# Patient Record
Sex: Female | Born: 1955
Health system: Southern US, Community
[De-identification: ages and names within clinical notes are randomized; demographics above are authoritative.]

## PROBLEM LIST (undated history)

## (undated) DIAGNOSIS — E119 Type 2 diabetes mellitus without complications: Secondary | ICD-10-CM

## (undated) DIAGNOSIS — I10 Essential (primary) hypertension: Secondary | ICD-10-CM

## (undated) DIAGNOSIS — M1990 Unspecified osteoarthritis, unspecified site: Secondary | ICD-10-CM

## (undated) DIAGNOSIS — Z8669 Personal history of other diseases of the nervous system and sense organs: Secondary | ICD-10-CM

## (undated) DIAGNOSIS — K589 Irritable bowel syndrome without diarrhea: Secondary | ICD-10-CM

## (undated) DIAGNOSIS — N809 Endometriosis, unspecified: Secondary | ICD-10-CM

## (undated) HISTORY — DX: Type 2 diabetes mellitus without complications: E11.9

## (undated) HISTORY — DX: Essential (primary) hypertension: I10

## (undated) HISTORY — PX: BACK SURGERY: SHX140

## (undated) HISTORY — DX: Personal history of other diseases of the nervous system and sense organs: Z86.69

## (undated) HISTORY — PX: BREAST BIOPSY: SHX20

## (undated) HISTORY — DX: Irritable bowel syndrome, unspecified: K58.9

## (undated) HISTORY — DX: Unspecified osteoarthritis, unspecified site: M19.90

## (undated) HISTORY — DX: Endometriosis, unspecified: N80.9

## (undated) HISTORY — PX: TONSILLECTOMY AND ADENOIDECTOMY: SUR1326

---

## 1998-10-06 ENCOUNTER — Other Ambulatory Visit: Admission: RE | Admit: 1998-10-06 | Discharge: 1998-10-06 | Payer: Self-pay | Admitting: Obstetrics and Gynecology

## 2001-01-09 ENCOUNTER — Other Ambulatory Visit: Admission: RE | Admit: 2001-01-09 | Discharge: 2001-01-09 | Payer: Self-pay | Admitting: Obstetrics and Gynecology

## 2002-07-03 ENCOUNTER — Other Ambulatory Visit: Admission: RE | Admit: 2002-07-03 | Discharge: 2002-07-03 | Payer: Self-pay | Admitting: Obstetrics and Gynecology

## 2004-02-02 ENCOUNTER — Other Ambulatory Visit: Admission: RE | Admit: 2004-02-02 | Discharge: 2004-02-02 | Payer: Self-pay | Admitting: Obstetrics and Gynecology

## 2004-06-28 ENCOUNTER — Ambulatory Visit (HOSPITAL_COMMUNITY): Admission: RE | Admit: 2004-06-28 | Discharge: 2004-06-29 | Payer: Self-pay | Admitting: Neurosurgery

## 2005-03-08 ENCOUNTER — Other Ambulatory Visit: Admission: RE | Admit: 2005-03-08 | Discharge: 2005-03-08 | Payer: Self-pay | Admitting: Obstetrics and Gynecology

## 2005-03-30 ENCOUNTER — Encounter (INDEPENDENT_AMBULATORY_CARE_PROVIDER_SITE_OTHER): Payer: Self-pay | Admitting: *Deleted

## 2005-03-30 ENCOUNTER — Encounter: Admission: RE | Admit: 2005-03-30 | Discharge: 2005-03-30 | Payer: Self-pay | Admitting: Obstetrics and Gynecology

## 2005-09-11 ENCOUNTER — Encounter: Admission: RE | Admit: 2005-09-11 | Discharge: 2005-09-11 | Payer: Self-pay | Admitting: Obstetrics and Gynecology

## 2005-09-21 ENCOUNTER — Ambulatory Visit: Payer: Self-pay | Admitting: Internal Medicine

## 2006-03-27 ENCOUNTER — Encounter: Admission: RE | Admit: 2006-03-27 | Discharge: 2006-03-27 | Payer: Self-pay | Admitting: Obstetrics and Gynecology

## 2007-04-02 ENCOUNTER — Encounter: Admission: RE | Admit: 2007-04-02 | Discharge: 2007-04-02 | Payer: Self-pay | Admitting: Obstetrics and Gynecology

## 2008-04-02 ENCOUNTER — Encounter: Admission: RE | Admit: 2008-04-02 | Discharge: 2008-04-02 | Payer: Self-pay | Admitting: Obstetrics and Gynecology

## 2009-04-02 ENCOUNTER — Encounter: Admission: RE | Admit: 2009-04-02 | Discharge: 2009-04-02 | Payer: Self-pay | Admitting: Obstetrics and Gynecology

## 2010-03-03 ENCOUNTER — Encounter: Admission: RE | Admit: 2010-03-03 | Discharge: 2010-03-03 | Payer: Self-pay | Admitting: Obstetrics and Gynecology

## 2011-01-13 NOTE — Op Note (Signed)
[name], [name]                ACCOUNT NO.:  [id number]   MEDICAL RECORD NO.:  [id number]          PATIENT TYPE:  OIB   LOCATION:  2899                         FACILITY:  [facility]   PHYSICIAN:  Hilda Lias, M.D.   DATE OF BIRTH:  [birth year]   DATE OF PROCEDURE:  06/28/2004  DATE OF DISCHARGE:                                 OPERATIVE REPORT   PREOPERATIVE DIAGNOSES:  1.  Right L5-S1 herniated disk.  2.  Obesity.   POSTOPERATIVE DIAGNOSES:  1.  Right L5-S1 herniated disk.  2.  Obesity.   PROCEDURE:  Right L5-S1 diskectomy and foraminotomy, using the microscope.   SURGEON:  Hilda Lias, M.D.   INDICATIONS FOR PROCEDURE:  The patient was admitted because of back and  right leg pain.  An MRI showed that she has degenerative disk disease at  multiple levels, with a herniated disk compromising the S1 nerve root.  The  patient would like to proceed with surgery, and the risks were explained in  the history and physical.   DESCRIPTION OF PROCEDURE:  The patient was taken to the operating room.  The  intubation took at least one-half an hour because of difficulty with her  small larynx.  Nevertheless at the end, she was positioned in a prone  manner.  The back was prepped with Betadine.  It was difficult to palpate  any bone landmarks because of her obesity.  We made a midline incision right  below the iliac crest in the midline, and we went straight down through the  skin, subcutaneous, adipose tissue, down to the fascia.  The fascia was  reflected as well as the muscle.  Indeed we knew that we are at the level of  L5-S1.  Nevertheless an x-ray was taken, which showed that indeed we were at  the L5-S1 level.  Then because of the poor visualization, we brought the  microscope immediately.  With the drill we drilled at the level of the  lamina of L5 and the upper pole of S1.  A calcified yellow ligament was also  excised.  We found the S1 nerve root which was a little displaced  posteriorly and medially, and it was swollen and reddish.  Lysis was  achieved, sheath retraction was made.  Indeed there was a large herniated  disk compromising the takeoff of S1.  An incision was made and two large  pieces of fragment were removed.  From then on we entered the disk space  which was quite narrow and a total broad diskectomy was achieved.  This was  done medially and laterally.  We investigated the foramen and it was empty.  A foraminotomy was accomplished.  At the end the patient had good  mobilization of the S1 nerve root, as well as the L5.  Valsalva maneuver was  negative.  The area was irrigated.  Fentanyl and Depo-Medrol were left in  the apical space.  From then on we closed the fascia with Vicryl and the  skin with subcutaneous #3-0.  The patient did well.     EB/MEDQ  D:  06/28/2004  T:  06/28/2004  Job:  [id number]

## 2011-01-30 ENCOUNTER — Other Ambulatory Visit: Payer: Self-pay | Admitting: Obstetrics and Gynecology

## 2011-01-30 DIAGNOSIS — Z1231 Encounter for screening mammogram for malignant neoplasm of breast: Secondary | ICD-10-CM

## 2011-03-20 ENCOUNTER — Ambulatory Visit
Admission: RE | Admit: 2011-03-20 | Discharge: 2011-03-20 | Disposition: A | Discharge status: Home / Self Care | Payer: BC Managed Care – PPO | Source: Ambulatory Visit | Attending: Obstetrics and Gynecology | Admitting: Obstetrics and Gynecology

## 2011-03-20 DIAGNOSIS — Z1231 Encounter for screening mammogram for malignant neoplasm of breast: Secondary | ICD-10-CM

## 2011-03-28 ENCOUNTER — Other Ambulatory Visit: Payer: Self-pay | Admitting: Obstetrics and Gynecology

## 2012-02-13 ENCOUNTER — Other Ambulatory Visit: Payer: Self-pay | Admitting: Obstetrics and Gynecology

## 2012-02-13 DIAGNOSIS — Z1231 Encounter for screening mammogram for malignant neoplasm of breast: Secondary | ICD-10-CM

## 2012-03-20 ENCOUNTER — Ambulatory Visit
Admission: RE | Admit: 2012-03-20 | Discharge: 2012-03-20 | Disposition: A | Discharge status: Home / Self Care | Payer: BC Managed Care – PPO | Source: Ambulatory Visit | Attending: Obstetrics and Gynecology | Admitting: Obstetrics and Gynecology

## 2012-03-20 ENCOUNTER — Other Ambulatory Visit: Payer: Self-pay | Admitting: Obstetrics and Gynecology

## 2012-03-20 DIAGNOSIS — N644 Mastodynia: Secondary | ICD-10-CM

## 2012-03-20 DIAGNOSIS — Z1231 Encounter for screening mammogram for malignant neoplasm of breast: Secondary | ICD-10-CM

## 2012-04-04 ENCOUNTER — Ambulatory Visit
Admission: RE | Admit: 2012-04-04 | Discharge: 2012-04-04 | Disposition: A | Discharge status: Home / Self Care | Payer: BC Managed Care – PPO | Source: Ambulatory Visit | Attending: Obstetrics and Gynecology | Admitting: Obstetrics and Gynecology

## 2012-04-04 ENCOUNTER — Other Ambulatory Visit: Payer: Self-pay | Admitting: Obstetrics and Gynecology

## 2012-04-04 DIAGNOSIS — N644 Mastodynia: Secondary | ICD-10-CM

## 2013-02-17 ENCOUNTER — Other Ambulatory Visit: Payer: Self-pay

## 2013-02-17 DIAGNOSIS — Z1231 Encounter for screening mammogram for malignant neoplasm of breast: Secondary | ICD-10-CM

## 2013-04-04 ENCOUNTER — Ambulatory Visit
Admission: RE | Admit: 2013-04-04 | Discharge: 2013-04-04 | Disposition: A | Discharge status: Home / Self Care | Payer: BC Managed Care – PPO | Source: Ambulatory Visit

## 2013-04-04 DIAGNOSIS — Z1231 Encounter for screening mammogram for malignant neoplasm of breast: Secondary | ICD-10-CM

## 2013-06-13 ENCOUNTER — Encounter: Payer: Self-pay | Admitting: Vascular Surgery

## 2013-06-24 ENCOUNTER — Encounter: Payer: Self-pay | Admitting: Vascular Surgery

## 2013-06-25 ENCOUNTER — Ambulatory Visit (INDEPENDENT_AMBULATORY_CARE_PROVIDER_SITE_OTHER): Payer: BC Managed Care – PPO | Admitting: Vascular Surgery

## 2013-06-25 ENCOUNTER — Encounter: Payer: Self-pay | Admitting: Vascular Surgery

## 2013-06-25 VITALS — BP 148/82 | HR 78 | Resp 16 | Ht 65.0 in | Wt 234.0 lb

## 2013-06-25 DIAGNOSIS — I728 Aneurysm of other specified arteries: Secondary | ICD-10-CM | POA: Insufficient documentation

## 2013-06-25 DIAGNOSIS — R319 Hematuria, unspecified: Secondary | ICD-10-CM

## 2013-06-25 DIAGNOSIS — M5137 Other intervertebral disc degeneration, lumbosacral region: Secondary | ICD-10-CM

## 2013-06-25 DIAGNOSIS — Z01810 Encounter for preprocedural cardiovascular examination: Secondary | ICD-10-CM

## 2013-06-25 DIAGNOSIS — M51369 Other intervertebral disc degeneration, lumbar region without mention of lumbar back pain or lower extremity pain: Secondary | ICD-10-CM

## 2013-06-25 DIAGNOSIS — M5136 Other intervertebral disc degeneration, lumbar region: Secondary | ICD-10-CM

## 2013-06-25 NOTE — Progress Notes (Signed)
[facility] [place]  Referred by:  Bjorn Pippin, MD [address] [place], [place] [address]  Reason for referral: splenic artery aneurysm  History of Present Illness  Raven Green is a 57 y.o. (Sep 20, 1955) female who presents with chief complaint: splenic artery aneurysm.  This patient had an episode of bilateral flank pain and hemauria a few months ago.  Initially, it was felt her sx were due to a UTI, but a month later she still had sx and hematuria.  She was referred to Urology for evaluation.  A non-contrast CT Abd/pelvis was ordered and incidentally a calcified splenic artery aneurysm was found.  The patient currently has no flank pain or hematuria.  She denies any LUQ pain.  The patient has had two kids already and now is menopausal.  She has no familial aneurysmal history.  Past Medical History  Diagnosis Date  . Arthritis   . Endometriosis   . Diabetes mellitus without complication     gestational  . Hypertension   . IBS (irritable bowel syndrome)   . History of migraine headaches     Past Surgical History  Procedure Laterality Date  . Back surgery    . Tonsillectomy and adenoidectomy      History   Social History  . Marital Status: Married    Spouse Name: N/A    Number of Children: N/A  . Years of Education: N/A   Occupational History  . Not on file.   Social History Main Topics  . Smoking status: Never Smoker   . Smokeless tobacco: Never Used  . Alcohol Use: Yes  . Drug Use: No  . Sexual Activity: Not on file   Other Topics Concern  . Not on file   Social History Narrative  . No narrative on file    Family History  Problem Relation Age of Onset  . Cancer Maternal Grandmother     breast  . Cancer Maternal Grandfather     bladder  . Deep vein thrombosis Mother   . Hyperlipidemia Mother   . Hypertension Mother   . Varicose Veins Mother   . Other Mother     lupus anticoagulation  . Diabetes Father   .  Hypertension Father   . Peripheral vascular disease Father   . Cancer Father     bladder  . Diabetes Sister    Current Outpatient Prescriptions on File Prior to Visit  Medication Sig Dispense Refill  . chlorthalidone (HYGROTON) 25 MG tablet Take 25 mg by mouth daily.      . metoprolol succinate (TOPROL-XL) 25 MG 24 hr tablet Take 25 mg by mouth daily. Take 1/2 tablet daily       No current facility-administered medications on file prior to visit.    Allergies  Allergen Reactions  . Codeine   . Sulfa Antibiotics      REVIEW OF SYSTEMS:  (Positives checked otherwise negative)  CARDIOVASCULAR:  []  chest pain, []  chest pressure, []  palpitations, []  shortness of breath when laying flat, []  shortness of breath with exertion,  []  pain in feet when walking, []  pain in feet when laying flat, []  history of blood clot in veins (DVT), []  history of phlebitis, []  swelling in legs, [x]  varicose veins  PULMONARY:  []  productive cough, []  asthma, []  wheezing  NEUROLOGIC:  []  weakness in arms or legs, []  numbness in arms or legs, []  difficulty speaking or slurred speech, []  temporary loss of vision in one  eye, []  dizziness  HEMATOLOGIC:  []  bleeding problems, []  problems with blood clotting too easily  MUSCULOSKEL:  []  joint pain, []  joint swelling  GASTROINTEST:  []  vomiting blood, []  blood in stool     GENITOURINARY:  []  burning with urination, []  blood in urine  PSYCHIATRIC:  []  history of major depression  INTEGUMENTARY:  []  rashes, []  ulcers  CONSTITUTIONAL:  []  fever, []  chills   Physical Examination  Filed Vitals:   06/25/13 1523  BP: 148/82  Pulse: 78  Resp: 16  Height: 5\' 5"  (1.651 m)  Weight: 234 lb (106.142 kg)    Body mass index is 38.94 kg/(m^2).  General: A&O x 3, WD, obese  Head: [place]/AT  Ear/Nose/Throat: Hearing grossly intact, nares w/o erythema or drainage, oropharynx w/o Erythema/Exudate, Mallampati score: 3  Eyes: PERRLA, EOMI  Neck: Supple, no nuchal  rigidity, no palpable LAD  Pulmonary: Sym exp, good air movt, CTAB, no rales, rhonchi, & wheezing  Cardiac: RRR, Nl S1, S2, no Murmurs, rubs or gallops  Vascular: Vessel Right Left  Radial Palpable Palpable  Brachial Palpable Palpable  Carotid Palpable, without bruit Palpable, without bruit  Aorta Not palpable N/A  Femoral Palpable Palpable  Popliteal Not palpable Not palpable  PT Palpable Palpable  DP Palpable Palpable   Gastrointestinal: soft, NTND, -G/R, - HSM, - masses, - CVAT B, no LUQ or L flank pain  Musculoskeletal: M/S 5/5 throughout , Extremities without ischemic changes   Neurologic: CN 2-12 intact , Pain and light touch intact in extremities , Motor exam as listed above  Psychiatric: Judgment intact, Mood & affect appropriate for pt's clinical situation  Dermatologic: See M/S exam for extremity exam, no rashes otherwise noted  Lymph : No Cervical, Axillary, or Inguinal lymphadenopathy   CT Abd/pelvis without contrast (05/16/13)   Probable 28 mm calcified splenic artery aneurysm  Based on my review of the patient's CT, he has a calcified mass in LUQ.  It's proximity to the tail of the pancreas and spleen makes it likely a splenic artery aneurysm.  Further evaluation with a CTA is necessary.  Outside Studies/Documentation 10 pages of outside documents were reviewed including: outside CT report, urology reports.  Medical Decision Making  Raven Green is a 57 y.o. female who presents with: possible splenic artery aneurysm.   I would confirm the SAA with a CTA abd/pelvis with 3D reconstruction of the splenic artery to try to get measurements.  The calcified mass appears to be > 2 cm, so if a SAA is confirmed, treatment is indicated.  Depending on the CTA findings, treatment may involve embolization, cover stent placement, or splenectomy.  Due to the possibility of future interventions that may cause loss of the spleen, I recommend going ahead and giving  vaccinations for encapsulated organisms: pneumococcus, H. Influenza, and meningococcus.  The patient will follow up in 2-3 weeks after the CTA is completed and the vaccines have been given.  Thank you for allowing [place] to participate in this patient's care.  Leonides Sake, MD [facility] [place] Office: [phone] Pager: [phone]  06/25/2013, 4:16 PM

## 2013-07-01 ENCOUNTER — Encounter: Payer: BC Managed Care – PPO | Admitting: Vascular Surgery

## 2013-07-10 ENCOUNTER — Encounter: Payer: Self-pay | Admitting: Vascular Surgery

## 2013-07-11 ENCOUNTER — Ambulatory Visit
Admission: RE | Admit: 2013-07-11 | Discharge: 2013-07-11 | Disposition: A | Discharge status: Home / Self Care | Payer: BC Managed Care – PPO | Source: Ambulatory Visit | Attending: Vascular Surgery | Admitting: Vascular Surgery

## 2013-07-11 ENCOUNTER — Other Ambulatory Visit: Payer: BC Managed Care – PPO

## 2013-07-11 ENCOUNTER — Encounter: Payer: Self-pay | Admitting: Vascular Surgery

## 2013-07-11 ENCOUNTER — Ambulatory Visit (INDEPENDENT_AMBULATORY_CARE_PROVIDER_SITE_OTHER): Payer: BC Managed Care – PPO | Admitting: Vascular Surgery

## 2013-07-11 ENCOUNTER — Inpatient Hospital Stay: Admission: RE | Admit: 2013-07-11 | Payer: BC Managed Care – PPO | Source: Ambulatory Visit

## 2013-07-11 VITALS — BP 134/59 | HR 66 | Ht 65.0 in | Wt 228.2 lb

## 2013-07-11 DIAGNOSIS — I728 Aneurysm of other specified arteries: Secondary | ICD-10-CM

## 2013-07-11 DIAGNOSIS — Z01810 Encounter for preprocedural cardiovascular examination: Secondary | ICD-10-CM

## 2013-07-11 MED ORDER — IOHEXOL 350 MG/ML SOLN
75.0000 mL | Freq: Once | INTRAVENOUS | Status: AC | PRN
  Administered 2013-07-11: 75 mL via INTRAVENOUS

## 2013-07-11 NOTE — Progress Notes (Signed)
[facility] [place]  Established Splenic Aneurysm  History of Present Illness  The patient is a 57 y.o. (11-Mar-1956) female who presents with chief complaint: follow up from CTA to evaluate an incidentally found SAA.  Pt is currentl asx and post-menopausal.  She had her meningococcal and pneumococcal vaccine given but did not get her H.influenza vaccination due to mistake with the flu vaccine.    Past Medical History  Diagnosis Date  . Arthritis   . Endometriosis   . Diabetes mellitus without complication     gestational  . Hypertension   . IBS (irritable bowel syndrome)   . History of migraine headaches     Past Surgical History  Procedure Laterality Date  . Back surgery    . Tonsillectomy and adenoidectomy      History   Social History  . Marital Status: Married    Spouse Name: N/A    Number of Children: N/A  . Years of Education: N/A   Occupational History  . Not on file.   Social History Main Topics  . Smoking status: Never Smoker   . Smokeless tobacco: Never Used  . Alcohol Use: Yes  . Drug Use: No  . Sexual Activity: Not on file   Other Topics Concern  . Not on file   Social History Narrative  . No narrative on file    Family History  Problem Relation Age of Onset  . Cancer Maternal Grandmother     breast  . Cancer Maternal Grandfather     bladder  . Deep vein thrombosis Mother   . Hyperlipidemia Mother   . Hypertension Mother   . Varicose Veins Mother   . Other Mother     lupus anticoagulation  . Diabetes Father   . Hypertension Father   . Peripheral vascular disease Father   . Cancer Father     bladder  . Diabetes Sister    Current Outpatient Prescriptions on File Prior to Visit  Medication Sig Dispense Refill  . chlorthalidone (HYGROTON) 25 MG tablet Take 25 mg by mouth daily.      . metoprolol succinate (TOPROL-XL) 25 MG 24 hr tablet Take 25 mg by mouth daily. Take 1/2 tablet daily       No current  facility-administered medications on file prior to visit.    Allergies  Allergen Reactions  . Codeine   . Sulfa Antibiotics    REVIEW OF SYSTEMS: (Positives checked otherwise negative)  CARDIOVASCULAR: []  chest pain, []  chest pressure, []  palpitations, []  shortness of breath when laying flat, []  shortness of breath with exertion, []  pain in feet when walking, []  pain in feet when laying flat, []  history of blood clot in veins (DVT), []  history of phlebitis, []  swelling in legs, [x]  varicose veins  PULMONARY: []  productive cough, []  asthma, []  wheezing  NEUROLOGIC: []  weakness in arms or legs, []  numbness in arms or legs, []  difficulty speaking or slurred speech, []  temporary loss of vision in one eye, []  dizziness  HEMATOLOGIC: []  bleeding problems, []  problems with blood clotting too easily  MUSCULOSKEL: []  joint pain, []  joint swelling  GASTROINTEST: []  vomiting blood, []  blood in stool  GENITOURINARY: []  burning with urination, []  blood in urine  PSYCHIATRIC: []  history of major depression  INTEGUMENTARY: []  rashes, []  ulcers  CONSTITUTIONAL: []  fever, []  chills  Physical Examination  Filed Vitals:   07/11/13 1029  BP: 134/59  Pulse: 66  Height: 5\' 5"  (1.651 m)  Weight:  228 lb 3.2 oz (103.511 kg)  SpO2: 97%   Body mass index is 37.97 kg/(m^2).  General: A&O x 3, WD, obese   Pulmonary: Sym exp, good air movt, CTAB, no rales, rhonchi, & wheezing   Cardiac: RRR, Nl S1, S2, no Murmurs, rubs or gallops   Vascular:  Vessel  Right  Left   Radial  Palpable  Palpable   Brachial  Palpable  Palpable   Carotid  Palpable, without bruit  Palpable, without bruit   Aorta  Not palpable  N/A   Femoral  Palpable  Palpable   Popliteal  Not palpable  Not palpable   PT  Palpable  Palpable   DP  Palpable  Palpable    Gastrointestinal: soft, NTND, -G/R, - HSM, - masses, - CVAT B, no LUQ or L flank pain   Musculoskeletal: M/S 5/5 throughout , Extremities without ischemic changes    Neurologic: Pain and light touch intact in extremities , Motor exam as listed above   CTA Abd/pelvis (07/11/2013)  Splenic artery aneurysm measuring 3 cm in greatest dimension.   In lies in the midportion of the splenic artery along the tail of the pancreas.   No complicating factors are noted at this time.   It shows evidence of a wide neck.  Left renal cyst.   No other focal abnormality is noted.  Based on my review of the CTA, her aneurysm appears compatible with possible covered stent placement.  The splenic artery appears to be 7 mm proximal and distal to the aneurysm.  There appears to be some normal splenic artery distal to the aneurysm.  Medical Decision Making  The patient is a 57 y.o. female who presents with: asx moderate size SAA  Based on the CTA, I recommend SAA exclusion with covered stent vs embolization.  The patient still needs to get her H.influenza vaccination.  The patient's schedule is more compatible with intervention next month, DEC 17-19.  I discussed with the patient the nature of angiographic procedures, especially the limited patencies of any endovascular intervention.    The patient is aware of that the risks of an angiographic procedure include but are not limited to: bleeding, infection, access site complications, renal failure, embolization, rupture of vessel, dissection, possible need for emergent surgical intervention, possible need for surgical procedures to treat the patient's pathology, anaphylactic reaction to contrast, and stroke and death.    The patient is aware of the risks and agrees to proceed.  Thank you for allowing [place] to participate in this patient's care.  Leonides Sake, MD [facility] Office: [phone] Pager: [phone]  07/11/2013, 1:45 PM

## 2013-07-21 ENCOUNTER — Other Ambulatory Visit: Payer: Self-pay | Admitting: *Deleted

## 2013-07-21 ENCOUNTER — Encounter: Payer: Self-pay | Admitting: *Deleted

## 2013-07-21 DIAGNOSIS — Z01818 Encounter for other preprocedural examination: Secondary | ICD-10-CM

## 2013-08-12 MED ORDER — SODIUM CHLORIDE 0.9 % IV SOLN
INTRAVENOUS | Status: DC
  Administered 2013-08-13: 08:00:00 via INTRAVENOUS

## 2013-08-13 ENCOUNTER — Encounter (HOSPITAL_COMMUNITY): Payer: Self-pay | Admitting: *Deleted

## 2013-08-13 ENCOUNTER — Encounter (HOSPITAL_COMMUNITY)
Admission: RE | Disposition: A | Discharge status: Home / Self Care | Payer: Self-pay | Source: Ambulatory Visit | Attending: Vascular Surgery

## 2013-08-13 ENCOUNTER — Observation Stay (HOSPITAL_COMMUNITY)
Admission: RE | Admit: 2013-08-13 | Discharge: 2013-08-14 | Disposition: A | Discharge status: Home / Self Care | Payer: BC Managed Care – PPO | Source: Ambulatory Visit | Attending: Vascular Surgery | Admitting: Vascular Surgery

## 2013-08-13 DIAGNOSIS — M51379 Other intervertebral disc degeneration, lumbosacral region without mention of lumbar back pain or lower extremity pain: Secondary | ICD-10-CM | POA: Insufficient documentation

## 2013-08-13 DIAGNOSIS — M5137 Other intervertebral disc degeneration, lumbosacral region: Secondary | ICD-10-CM | POA: Insufficient documentation

## 2013-08-13 DIAGNOSIS — I728 Aneurysm of other specified arteries: Principal | ICD-10-CM | POA: Diagnosis present

## 2013-08-13 DIAGNOSIS — M129 Arthropathy, unspecified: Secondary | ICD-10-CM | POA: Insufficient documentation

## 2013-08-13 DIAGNOSIS — N809 Endometriosis, unspecified: Secondary | ICD-10-CM | POA: Insufficient documentation

## 2013-08-13 DIAGNOSIS — I10 Essential (primary) hypertension: Secondary | ICD-10-CM | POA: Insufficient documentation

## 2013-08-13 DIAGNOSIS — G43909 Migraine, unspecified, not intractable, without status migrainosus: Secondary | ICD-10-CM | POA: Insufficient documentation

## 2013-08-13 DIAGNOSIS — K589 Irritable bowel syndrome without diarrhea: Secondary | ICD-10-CM | POA: Insufficient documentation

## 2013-08-13 DIAGNOSIS — Z01818 Encounter for other preprocedural examination: Secondary | ICD-10-CM

## 2013-08-13 HISTORY — PX: EMBOLIZATION: SHX5507

## 2013-08-13 HISTORY — PX: VISCERAL ANGIOGRAM: SHX5515

## 2013-08-13 LAB — POCT I-STAT, CHEM 8
Creatinine, Ser: 0.7 mg/dL (ref 0.50–1.10)
Glucose, Bld: 194 mg/dL — ABNORMAL HIGH (ref 70–99)
HCT: 36 % (ref 36.0–46.0)
Hemoglobin: 12.2 g/dL (ref 12.0–15.0)
Potassium: 3.1 mEq/L — ABNORMAL LOW (ref 3.5–5.1)
TCO2: 28 mmol/L (ref 0–100)

## 2013-08-13 LAB — POCT ACTIVATED CLOTTING TIME: Activated Clotting Time: 132 seconds

## 2013-08-13 LAB — CBC
HCT: 35.6 % — ABNORMAL LOW (ref 36.0–46.0)
Hemoglobin: 11.7 g/dL — ABNORMAL LOW (ref 12.0–15.0)
MCH: 29.1 pg (ref 26.0–34.0)
MCHC: 32.9 g/dL (ref 30.0–36.0)
MCV: 88.6 fL (ref 78.0–100.0)
RBC: 4.02 MIL/uL (ref 3.87–5.11)
RDW: 13.6 % (ref 11.5–15.5)

## 2013-08-13 LAB — CREATININE, SERUM
Creatinine, Ser: 0.66 mg/dL (ref 0.50–1.10)
GFR calc Af Amer: >90 mL/min (ref >90)
GFR calc non Af Amer: >90 mL/min (ref >90)

## 2013-08-13 SURGERY — VISCERAL ANGIOGRAM
Anesthesia: LOCAL

## 2013-08-13 MED ORDER — METOPROLOL TARTRATE 1 MG/ML IV SOLN: 2.0000 mg | INTRAVENOUS | Status: DC | PRN

## 2013-08-13 MED ORDER — OXYCODONE-ACETAMINOPHEN 5-325 MG PO TABS
1.0000 | ORAL_TABLET | ORAL | Status: DC | PRN
  Administered 2013-08-13: 2 via ORAL
  Filled 2013-08-13: qty 2

## 2013-08-13 MED ORDER — MIDAZOLAM HCL 2 MG/2ML IJ SOLN
INTRAMUSCULAR | Status: AC
  Filled 2013-08-13: qty 2

## 2013-08-13 MED ORDER — METOPROLOL SUCCINATE ER 25 MG PO TB24
12.5000 mg | ORAL_TABLET | Freq: Every day | ORAL | Status: DC
  Administered 2013-08-14: 12.5 mg via ORAL
  Filled 2013-08-13: qty 0.5

## 2013-08-13 MED ORDER — OXYCODONE-ACETAMINOPHEN 5-325 MG PO TABS: 1.0000 | ORAL_TABLET | ORAL | Status: DC | PRN

## 2013-08-13 MED ORDER — SODIUM CHLORIDE 0.9 % IV SOLN
INTRAVENOUS | Status: DC
  Administered 2013-08-13: 13:00:00 via INTRAVENOUS

## 2013-08-13 MED ORDER — ONDANSETRON HCL 4 MG/2ML IJ SOLN
4.0000 mg | Freq: Four times a day (QID) | INTRAMUSCULAR | Status: DC | PRN
  Administered 2013-08-13: 4 mg via INTRAVENOUS
  Filled 2013-08-13: qty 2

## 2013-08-13 MED ORDER — HEPARIN SODIUM (PORCINE) 1000 UNIT/ML IJ SOLN
INTRAMUSCULAR | Status: AC
Start: 2013-08-13 — End: 2013-08-13
  Filled 2013-08-13: qty 1

## 2013-08-13 MED ORDER — GUAIFENESIN-DM 100-10 MG/5ML PO SYRP: 15.0000 mL | ORAL_SOLUTION | ORAL | Status: DC | PRN

## 2013-08-13 MED ORDER — SODIUM CHLORIDE 0.9 % IV SOLN: 1.0000 mL/kg/h | INTRAVENOUS | Status: DC

## 2013-08-13 MED ORDER — LIDOCAINE HCL (PF) 1 % IJ SOLN
INTRAMUSCULAR | Status: AC
  Filled 2013-08-13: qty 30

## 2013-08-13 MED ORDER — HYDRALAZINE HCL 20 MG/ML IJ SOLN: 10.0000 mg | INTRAMUSCULAR | Status: DC | PRN

## 2013-08-13 MED ORDER — ENOXAPARIN SODIUM 40 MG/0.4ML ~~LOC~~ SOLN
40.0000 mg | SUBCUTANEOUS | Status: DC
  Filled 2013-08-13: qty 0.4

## 2013-08-13 MED ORDER — LABETALOL HCL 5 MG/ML IV SOLN: 10.0000 mg | INTRAVENOUS | Status: DC | PRN

## 2013-08-13 MED ORDER — FENTANYL CITRATE 0.05 MG/ML IJ SOLN
INTRAMUSCULAR | Status: AC
  Filled 2013-08-13: qty 2

## 2013-08-13 MED ORDER — PHENOL 1.4 % MT LIQD
1.0000 | OROMUCOSAL | Status: DC | PRN
  Filled 2013-08-13: qty 177

## 2013-08-13 MED ORDER — PROMETHAZINE HCL 25 MG/ML IJ SOLN
12.5000 mg | Freq: Four times a day (QID) | INTRAMUSCULAR | Status: DC | PRN
  Administered 2013-08-13: 12.5 mg via INTRAVENOUS
  Filled 2013-08-13: qty 1

## 2013-08-13 MED ORDER — ONDANSETRON HCL 4 MG/2ML IJ SOLN: 4.0000 mg | Freq: Four times a day (QID) | INTRAMUSCULAR | Status: DC | PRN

## 2013-08-13 MED ORDER — CHLORTHALIDONE 25 MG PO TABS
25.0000 mg | ORAL_TABLET | Freq: Every day | ORAL | Status: DC
  Administered 2013-08-14: 25 mg via ORAL
  Filled 2013-08-13: qty 1

## 2013-08-13 MED ORDER — ACETAMINOPHEN 325 MG PO TABS: 650.0000 mg | ORAL_TABLET | ORAL | Status: DC | PRN

## 2013-08-13 MED ORDER — HEPARIN (PORCINE) IN NACL 2-0.9 UNIT/ML-% IJ SOLN
INTRAMUSCULAR | Status: AC
  Filled 2013-08-13: qty 1000

## 2013-08-13 MED ORDER — ALUM & MAG HYDROXIDE-SIMETH 200-200-20 MG/5ML PO SUSP: 15.0000 mL | ORAL | Status: DC | PRN

## 2013-08-13 MED ORDER — ENOXAPARIN SODIUM 40 MG/0.4ML ~~LOC~~ SOLN
40.0000 mg | SUBCUTANEOUS | Status: DC
  Administered 2013-08-14: 40 mg via SUBCUTANEOUS
  Filled 2013-08-13 (×2): qty 0.4

## 2013-08-13 MED ORDER — PANTOPRAZOLE SODIUM 40 MG PO TBEC
40.0000 mg | DELAYED_RELEASE_TABLET | Freq: Every day | ORAL | Status: DC
  Administered 2013-08-14: 40 mg via ORAL
  Filled 2013-08-13: qty 1

## 2013-08-13 MED ORDER — MORPHINE SULFATE 2 MG/ML IJ SOLN
2.0000 mg | INTRAMUSCULAR | Status: DC | PRN
  Administered 2013-08-13: 2 mg via INTRAVENOUS
  Filled 2013-08-13: qty 1

## 2013-08-13 MED ORDER — MORPHINE SULFATE 10 MG/ML IJ SOLN: 2.0000 mg | INTRAMUSCULAR | Status: DC | PRN

## 2013-08-13 MED ORDER — DOCUSATE SODIUM 100 MG PO CAPS
100.0000 mg | ORAL_CAPSULE | Freq: Two times a day (BID) | ORAL | Status: DC
  Administered 2013-08-13 – 2013-08-14 (×2): 100 mg via ORAL
  Filled 2013-08-13 (×3): qty 1

## 2013-08-13 NOTE — H&P (Signed)
  [facility] [place]  Brief History and Physical  History of Present Illness  Raven Green is a 57 y.o. female who presents with chief complaint: splenic artery aneurysm.  The patient presents today for celiac artery angiogram, possible splenic artery exclusion vs embolization.    Past Medical History  Diagnosis Date  . Arthritis   . Endometriosis   . Diabetes mellitus without complication     gestational  . Hypertension   . IBS (irritable bowel syndrome)   . History of migraine headaches     Past Surgical History  Procedure Laterality Date  . Back surgery    . Tonsillectomy and adenoidectomy      History   Social History  . Marital Status: Married    Spouse Name: N/A    Number of Children: N/A  . Years of Education: N/A   Occupational History  . Not on file.   Social History Main Topics  . Smoking status: Never Smoker   . Smokeless tobacco: Never Used  . Alcohol Use: Yes  . Drug Use: No  . Sexual Activity: Not on file   Other Topics Concern  . Not on file   Social History Narrative  . No narrative on file    Family History  Problem Relation Age of Onset  . Cancer Maternal Grandmother     breast  . Cancer Maternal Grandfather     bladder  . Deep vein thrombosis Mother   . Hyperlipidemia Mother   . Hypertension Mother   . Varicose Veins Mother   . Other Mother     lupus anticoagulation  . Diabetes Father   . Hypertension Father   . Peripheral vascular disease Father   . Cancer Father     bladder  . Diabetes Sister     No current facility-administered medications on file prior to encounter.   Current Outpatient Prescriptions on File Prior to Encounter  Medication Sig Dispense Refill  . chlorthalidone (HYGROTON) 25 MG tablet Take 25 mg by mouth daily.      . metoprolol succinate (TOPROL-XL) 25 MG 24 hr tablet Take 25 mg by mouth daily. Take 1/2 tablet daily        Allergies  Allergen Reactions  . Codeine   . Sulfa  Antibiotics     Review of Systems: As listed above, otherwise negative.  Physical Examination  Filed Vitals:   08/13/13 0738  BP: 169/64  Pulse: 77  Temp: 97.5 F (36.4 C)  TempSrc: Oral  Resp: 18  Height: 5\' 5"  (1.651 m)  Weight: 230 lb (104.327 kg)  SpO2: 97%    General: A&O x 3, WDWN  Pulmonary: Sym exp, good air movt, CTAB, no rales, rhonchi, & wheezing  Cardiac: RRR, Nl S1, S2, no Murmurs, rubs or gallops  Gastrointestinal: soft, NTND, -G/R, - HSM, - masses, - CVAT B  Musculoskeletal: M/S 5/5 throughout , Extremities without ischemic changes   Laboratory See iStat  Medical Decision Making  Raven Green is a 57 y.o. female who presents with: SPLENIC ARTERY ANEURYSM.  The patient is scheduled for: celiac artery angiogram, possible exclusion vs embolization of splenic artery aneurysm.  The patient is aware of the risks and agrees to proceed.  Leonides Sake, MD [facility] [place] Office: [phone] Pager: [phone]  08/13/2013, 7:52 AM

## 2013-08-13 NOTE — Op Note (Addendum)
OPERATIVE NOTE   PROCEDURE: 1.  Right common femoral artery cannulation under ultrasound guidance 2.  Aortogram 3.  Selection of celiac artery 4.  Celiac artery angiogram 5.  Embolization of splenic artery (placement of Azur detachable hydrogel coils x 12)  PRE-OPERATIVE DIAGNOSIS: Large splenic artery aneurysn  POST-OPERATIVE DIAGNOSIS: same as above   SURGEON: Leonides Sake, MD  ANESTHESIA: conscious sedation  ESTIMATED BLOOD LOSS: 100 cc  CONTRAST: 75 cc  FINDING(S):  Normal appear aorta with patent bilateral renal arteries  Large splenic artery aneurysm in mid-segment with two branches coming of aneurysm  Successful embolization of splenic artery aneurysm with greatly reduced blood flow distal to embolized aneurysm  SPECIMEN(S):  none  INDICATIONS:   Raven Green is a 57 y.o. female who presents with large splenic artery aneurysm >3.0 cm.  Prior to proceeding with intervention on this aneurysm, the patient was given her Pneumococcal, meningococcal and H. Influenza vaccines given weeks prior to limit the risk of OPSS.  The patient presents for: celiac artery angiogram, possible exclusion vs embolization.  I discussed with the patient the nature of angiographic procedures, especially the limited patencies of any endovascular intervention.  The patient is aware of that the risks of an angiographic procedure include but are not limited to: bleeding, infection, access site complications, renal failure, embolization, rupture of vessel, dissection, possible need for emergent surgical intervention, possible need for surgical procedures to treat the patient's pathology, and stroke and death.  The patient is aware of the risks and agrees to proceed.  DESCRIPTION: After full informed consent was obtained from the patient, the patient was brought back to the angiography suite.  The patient was placed supine upon the angiography table and connected to monitoring equipment.  The patient was  then given conscious sedation, the amounts of which are documented in the patient's chart.  The patient was prepped and drape in the standard fashion for an angiographic procedure.  At this point, attention was turned to the right groin.  Under ultrasound guidance, the right common femoral artery was cannulated with a micropuncture needle.  The microwire was advanced into the iliac arterial system.  The needle was exchanged for a microsheath, which was loaded into the common femoral artery over the wire.  The microwire was exchanged for a [facility] wire which was advanced into the aorta.  The microsheath was then exchanged for a 6-Fr sheath which was loaded into the common femoral artery.  The Omniflush catheter was then loaded over the wire up to the level of T11.  The catheter was connected to the power injector circuit.  After de-airring and de-clotting the circuit, a power injector aortogram was completed.  I then did a lateral injection to identify the celiac artery.  Using Cobra catheter and [facility] wire, I was able to select the celiac artery. Hand injections identified the position of the large splenic artery aneurysm.  It required multiple injections in multiple positions before I could find a projection which splayed out the inflow and outflow from the aneurysm.  It was evident that there was a previously not visualized splenic artery branch also coming off the aneurysm.  I was able to advance the catheter into the proximal 5 cm of the splenic artery.  I place a Rosen wire into the splenic artery and then exchanged the right femoral sheath for a long 6 mm Ansel sheath, which I lodged into the splenic artery before the first big bend in the artery.  I gave  the patient 5000 units of Heparin to avoid thrombosis of the celiac artery.  I then using a BER-2 and Glidewire to steer through the aneurysm with some difficulty.  There was extensive tortuousity in this system.  Eventually, I was able to get into the large  branch of the splenic artery.  As I was trying to pull out the catheter, the entire system with wire recoiled out the branch artery back into the proximal splenic artery.  Based on the wire behavior, I felt it was going to be unlikely to pass a covered stent into the splenic artery, so I felt embolization of the splenic artery aneurysm was necessary.  I used the Progreat wire and catheter system to get into the splenic artery aneurysm.I removed the wire and started to deploy Azur detachable coil.  First. I deployed two framing coils: 14 mm x 34 cm.  This allowed me to deploy within the framing coils: 2 20 mm x 30 cm, 2 15 mm x 30 cm, and 2 10 mm x 20 cm.  I waited 10 minutes to allow the hydrogel to expand in the aneurysm.  Hand injections demonstrated continue vigorousd perfusion.  I then deployed: 2 Azur Cx coils 7 mm x 24 cm and Azur 4 mm x 15 cm.  I again waited for the hydrogel to expand.  There did not appear to be any further space to pack additional coiled.  Hand injection demonstrated decreased flow but some residual flow.  At this point, I deployed a Azur Cx 9 mm x 28 cm in the proximal splenic artery feeding the aneurysm.  The hand injection was under greatly increased resistance and the flow through aneurysm the was greatly decreased.  I tried to interrogate the coil aneurysm with a 0.014" Spartacore, and I was unable to navigate through the coiled aneurysm.  At this point, I felt no further intervention was needed.  I pulled out the wire and catheter and pulled out the sheath.  The sheath was aspirated.  No clots were present and the sheath was reloaded with heparinized saline.  A Benson wire was placed into the aorta and the femoral sheath was exchanged for a normal 6-Fr sheath.  The sheath was aspirated.  No clots were present and the sheath was reloaded with heparinized saline.     COMPLICATIONS: none  CONDITION: stable  Leonides Sake, MD [facility] [place] Office:  [phone] Pager: [phone]  08/13/2013, 4:45 PM

## 2013-08-14 LAB — HEPATIC FUNCTION PANEL
ALT: 12 U/L (ref 0–35)
AST: 14 U/L (ref 0–37)
Albumin: 2.8 g/dL — ABNORMAL LOW (ref 3.5–5.2)
Total Bilirubin: 0.4 mg/dL (ref 0.3–1.2)
Total Protein: 5.8 g/dL — ABNORMAL LOW (ref 6.0–8.3)

## 2013-08-14 LAB — CBC
HCT: 32 % — ABNORMAL LOW (ref 36.0–46.0)
MCHC: 32.5 g/dL (ref 30.0–36.0)
Platelets: 182 10*3/uL (ref 150–400)
RBC: 3.58 MIL/uL — ABNORMAL LOW (ref 3.87–5.11)
RDW: 13.6 % (ref 11.5–15.5)
WBC: 10.5 10*3/uL (ref 4.0–10.5)

## 2013-08-14 LAB — AMYLASE: Amylase: 35 U/L (ref 0–105)

## 2013-08-14 MED ORDER — OXYCODONE-ACETAMINOPHEN 5-325 MG PO TABS: 1.0000 | ORAL_TABLET | Freq: Four times a day (QID) | ORAL | Status: DC | PRN

## 2013-08-14 NOTE — Discharge Summary (Signed)
[facility] Discharge Summary  Raven Green [birth year] 57 y.o. female  [id number]  Admission Date: 08/13/2013  Discharge Date: 08/14/13  Physician: Fransisco Hertz, MD  Admission Diagnosis: pvd   HPI:   This is a 57 y.o. female who presents with chief complaint: splenic artery aneurysm. The patient presents today for celiac artery angiogram, possible splenic artery exclusion vs embolization.   Hospital Course:  The patient was admitted to the hospital and taken to the operating room on 08/13/2013 and underwent: 1. Right common femoral artery cannulation under ultrasound guidance  2. Aortogram  3. Selection of celiac artery  4. Celiac artery angiogram  5. Embolization of splenic artery (placement of Azur detachable hydrogel coils x 12)    The pt tolerated the procedure well and was transported to the PACU in good condition. LFT's were normal on POD 1. CMP     Component Value Date/Time   NA 142 08/13/2013 0728   K 3.1* 08/13/2013 0728   CL 98 08/13/2013 0728   GLUCOSE 194* 08/13/2013 0728   BUN 16 08/13/2013 0728   CREATININE 0.66 08/13/2013 1250   PROT 5.8* 08/14/2013 0643   ALBUMIN 2.8* 08/14/2013 0643   AST 14 08/14/2013 0643   ALT 12 08/14/2013 0643   ALKPHOS 63 08/14/2013 0643   BILITOT 0.4 08/14/2013 0643   GFRNONAA >90 08/13/2013 1250   GFRAA >90 08/13/2013 1250    The remainder of the hospital course consisted of increasing mobilization and increasing intake of solids without difficulty.  CBC    Component Value Date/Time   WBC 10.5 08/14/2013 0643   RBC 3.58* 08/14/2013 0643   HGB 10.4* 08/14/2013 0643   HCT 32.0* 08/14/2013 0643   PLT 182 08/14/2013 0643   MCV 89.4 08/14/2013 0643   MCH 29.1 08/14/2013 0643   MCHC 32.5 08/14/2013 0643   RDW 13.6 08/14/2013 0643    BMET    Component Value Date/Time   NA 142 08/13/2013 0728   K 3.1* 08/13/2013 0728   CL 98 08/13/2013 0728   GLUCOSE 194* 08/13/2013 0728   BUN 16 08/13/2013  0728   CREATININE 0.66 08/13/2013 1250   GFRNONAA >90 08/13/2013 1250   GFRAA >90 08/13/2013 1250     Discharge Instructions:   The patient is discharged to home with extensive instructions on wound care and progressive ambulation.  They are instructed not to drive or perform any heavy lifting until returning to see the physician in his office.  Discharge Orders   Future Orders Complete By Expires   Call MD for:  severe or increased pain, loss or decreased feeling  in affected limb(s)  As directed    Call MD for:  temperature >100.5  As directed    Discharge wound care:  As directed    Comments:     Shower daily with soap and water starting 08/15/13   Driving Restrictions  As directed    Comments:     No driving for 1 week and while taking pain medication   I-Stat, Chem 8  As directed    Lifting restrictions  As directed    Comments:     No lifting for 2 weeks   Resume previous diet  As directed       Discharge Diagnosis:  pvd  Secondary Diagnosis: Patient Active Problem List   Diagnosis Date Noted  . Splenic artery aneurysm 06/25/2013  . Hematuria 06/25/2013  . Degenerative disc disease, lumbar 06/25/2013   Past Medical  History  Diagnosis Date  . Arthritis   . Endometriosis   . Diabetes mellitus without complication     gestational  . Hypertension   . IBS (irritable bowel syndrome)   . History of migraine headaches        Medication List         chlorthalidone 25 MG tablet  Commonly known as:  HYGROTON  Take 25 mg by mouth daily.     metoprolol succinate 25 MG 24 hr tablet  Commonly known as:  TOPROL-XL  Take 12.5 mg by mouth daily.     oxyCODONE-acetaminophen 5-325 MG per tablet  Commonly known as:  PERCOCET/ROXICET  Take 1-2 tablets by mouth every 6 (six) hours as needed for moderate pain.        Roxicet #30 No Refill  Disposition: home  Patient's condition: is Good  Follow up: 1. Dr. Imogene Burn in 2 weeks   Doreatha Massed, PA-C [facility] [phone] 08/14/2013  10:23 AM  Addendum  I have independently interviewed and examined the patient, and I agree with the physician assistant's discharge summary.  This patient had a asx 3 cm splenic artery aneurysm which was treated with embolization.  Initially, she had abdominal pain, so she was admitted to observe for possible splenic infarction.  On POD #1, her pain has resolved and there is no evidence of access complication.  She will be discharged home with follow up in the office in two weeks.  Leonides Sake, MD [facility] [place] Office: [phone] Pager: [phone]  08/14/2013, 12:37 PM

## 2013-08-14 NOTE — Progress Notes (Signed)
   Daily Progress Note  Assessment/Planning: POD #1 s/p splenic artery aneurysm embolization   Pt can d/c later today if sx controlled  LFT and pancreas labs pending  Follow up in 2 weeks  Subjective  - 1 Day Post-Op  Epigastric pain gone, no further abd pain currently  Objective Filed Vitals:   08/13/13 1430 08/13/13 2000 08/14/13 0000 08/14/13 0400  BP: 131/60 119/63 107/48 98/35  Pulse:  62 55 65  Temp:  98.3 F (36.8 C) 97.9 F (36.6 C) 98.4 F (36.9 C)  TempSrc:  Oral Oral Oral  Resp:  18 18 18   Height:      Weight:      SpO2:  96% 92% 94%    Intake/Output Summary (Last 24 hours) at 08/14/13 0746 Last data filed at 08/13/13 1500  Gross per 24 hour  Intake    360 ml  Output      0 ml  Net    360 ml    PULM  CTAB CV  RRR GI  soft, NTND VASC  R groin soft, without hematoma  Laboratory CBC    Component Value Date/Time   WBC 9.0 08/13/2013 1250   HGB 11.7* 08/13/2013 1250   HCT 35.6* 08/13/2013 1250   PLT 198 08/13/2013 1250    BMET    Component Value Date/Time   NA 142 08/13/2013 0728   K 3.1* 08/13/2013 0728   CL 98 08/13/2013 0728   GLUCOSE 194* 08/13/2013 0728   BUN 16 08/13/2013 0728   CREATININE 0.66 08/13/2013 1250   GFRNONAA >90 08/13/2013 1250   GFRAA >90 08/13/2013 1250    Leonides Sake, MD [facility] [place] Office: [phone] Pager: [phone]  08/14/2013, 7:46 AM

## 2013-08-15 ENCOUNTER — Telehealth: Payer: Self-pay | Admitting: Vascular Surgery

## 2013-08-15 NOTE — Telephone Encounter (Addendum)
Message copied by Fredrich Birks on Fri Aug 15, 2013 10:56 AM ------      Message from: Dara Lords      Created: Thu Aug 14, 2013 10:22 AM       S/p       1.  Right common femoral artery cannulation under ultrasound guidance      2.  Aortogram      3.  Selection of celiac artery      4.  Celiac artery angiogram      5.  Embolization of splenic artery (placement of Azur detachable hydrogel coils x 12)            F/u with Dr. Imogene Burn in 2 weeks.            Thanks,      Samantha ------  08/15/13: spoke with pt to schedule, dpm

## 2013-08-29 ENCOUNTER — Other Ambulatory Visit: Payer: Self-pay | Admitting: *Deleted

## 2013-08-29 DIAGNOSIS — T814XXD Infection following a procedure, subsequent encounter: Principal | ICD-10-CM

## 2013-08-29 DIAGNOSIS — IMO0001 Reserved for inherently not codable concepts without codable children: Secondary | ICD-10-CM

## 2013-08-29 MED ORDER — CEPHALEXIN 500 MG PO CAPS: 500.0000 mg | ORAL_CAPSULE | Freq: Three times a day (TID) | ORAL | Status: DC

## 2013-08-29 NOTE — Progress Notes (Signed)
Patient called in to report that she has a small area on her groin incision (S/P Splenic artery coiling embolectomy by [facility] on 08/13/13) that is red, draining serosanguinous fluid, and is tender. She states that she just started wearing panties and thinks that this is causing the problem. She is afebrile but she does have a slight odor.  Per Dr. Kellie Simmering, I sent Rx to [facility] for 10 days of Keflex 500mg .  Pt was informed and will keep her postop appt with Dr. Bridgett Larsson next week as planned.

## 2013-09-04 ENCOUNTER — Encounter: Payer: Self-pay | Admitting: Vascular Surgery

## 2013-09-05 ENCOUNTER — Ambulatory Visit (INDEPENDENT_AMBULATORY_CARE_PROVIDER_SITE_OTHER): Payer: BC Managed Care – PPO | Admitting: Vascular Surgery

## 2013-09-05 ENCOUNTER — Encounter: Payer: Self-pay | Admitting: Vascular Surgery

## 2013-09-05 VITALS — BP 140/78 | HR 80 | Ht 65.0 in | Wt 236.0 lb

## 2013-09-05 DIAGNOSIS — I728 Aneurysm of other specified arteries: Secondary | ICD-10-CM

## 2013-09-05 NOTE — Progress Notes (Signed)
    S/p Splenic artery aneurysm embolization   History of Present Illness  Raven Green is a 58 y.o. female who presents for postoperative follow-up from procedure on Date: 08/13/13: Splenic artery aneurysm embolization .  The patient has had no abdomen pain or any fever or chills.  She did develop some serosang drain from the right groin puncture site and was given Keflex for possible infection at the access site.  The patient is able to complete their activities of daily living.  The patient's current symptoms are: none.  Past Medical History, Past Surgical History, Social History, Family History, Medications, Allergies, and Review of Systems are unchanged from previous evaluation on 08/13/13.  On ROS: no abdomen pain, ho hematochezia or melena  For VQI Use Only  PRE-ADM LIVING: Home  AMB STATUS: Ambulatory  Physical Examination  Filed Vitals:   09/05/13 0838  BP: 140/78  Pulse: 80  Height: 5\' 5"  (1.651 m)  Weight: 236 lb (107.049 kg)  SpO2: 98%   Body mass index is 39.27 kg/(m^2).  General: A&O x 3, WDWN  Pulmonary: Sym exp, good air movt, CTAB, no rales, rhonchi, & wheezing  Cardiac: RRR, Nl S1, S2, no Murmurs, rubs or gallops  Vascular: Bilateral femoral palpable, R femoral access without any evidence of drainage or cellulitis  Gastrointestinal: soft, NTND, no LUQ TTP, no left flank pain,  -G/R, - HSM, - masses, - CVAT B  Musculoskeletal: M/S 5/5 throughout , Extremities without ischemic changes   Neurologic:  Pain and light touch intact in extremities , Motor exam as listed above  Medical Decision Making  Raven Green is a 58 y.o. female who presents s/p SAA embolization. Fortunately, this patient's short gastric arteries have been able to perfuse her spleen as she is currently asx. Her initially RUQ pain after embolization clinically verifies adequate embolization of her SAA.  At this point, her risk of bleeding from a SAA should be minimal. I suspect she  developed a R groin hematoma after her access given the caliber of sheath needed for the case and depth of her artery, with time any drainage should resolve.  I have instructed her to finish her abx course. The patient can follow up with [place] as needed.  If she develops RUQ sx, feel free to refer her back to [place].    Thank you for allowing [place] to participate in this patient's care.  Adele Barthel, MD [facility] [place] Office: [phone] Pager: [phone]

## 2014-01-20 ENCOUNTER — Other Ambulatory Visit: Payer: Self-pay

## 2014-01-20 ENCOUNTER — Other Ambulatory Visit: Payer: Self-pay | Admitting: Obstetrics and Gynecology

## 2014-01-20 DIAGNOSIS — Z1231 Encounter for screening mammogram for malignant neoplasm of breast: Secondary | ICD-10-CM

## 2014-02-12 ENCOUNTER — Other Ambulatory Visit: Payer: Self-pay | Admitting: Obstetrics and Gynecology

## 2014-02-13 LAB — CYTOLOGY - PAP

## 2014-04-06 ENCOUNTER — Ambulatory Visit
Admission: RE | Admit: 2014-04-06 | Discharge: 2014-04-06 | Disposition: A | Discharge status: Home / Self Care | Payer: BC Managed Care – PPO | Source: Ambulatory Visit

## 2014-04-06 DIAGNOSIS — Z1231 Encounter for screening mammogram for malignant neoplasm of breast: Secondary | ICD-10-CM

## 2014-04-28 ENCOUNTER — Ambulatory Visit (INDEPENDENT_AMBULATORY_CARE_PROVIDER_SITE_OTHER): Payer: BC Managed Care – PPO

## 2014-04-28 ENCOUNTER — Ambulatory Visit (INDEPENDENT_AMBULATORY_CARE_PROVIDER_SITE_OTHER): Payer: BC Managed Care – PPO | Admitting: Orthopedic Surgery

## 2014-04-28 VITALS — BP 154/75 | Ht 65.0 in | Wt 222.0 lb

## 2014-04-28 DIAGNOSIS — M25561 Pain in right knee: Secondary | ICD-10-CM

## 2014-04-28 DIAGNOSIS — IMO0002 Reserved for concepts with insufficient information to code with codable children: Secondary | ICD-10-CM

## 2014-04-28 DIAGNOSIS — M1711 Unilateral primary osteoarthritis, right knee: Secondary | ICD-10-CM

## 2014-04-28 DIAGNOSIS — M25569 Pain in unspecified knee: Secondary | ICD-10-CM

## 2014-04-28 DIAGNOSIS — M1710 Unilateral primary osteoarthritis, unspecified knee: Secondary | ICD-10-CM

## 2014-04-28 NOTE — Patient Instructions (Addendum)
Activities as tolerated Home knee exercises  Knee Exercises EXERCISES RANGE OF MOTION(ROM) AND STRETCHING EXERCISES These exercises may help you when beginning to rehabilitate your injury. Your symptoms may resolve with or without further involvement from your physician, physical therapist or athletic trainer. While completing these exercises, remember:    Restoring tissue flexibility helps normal motion to return to the joints. This allows healthier, less painful movement and activity.   An effective stretch should be held for at least 30 seconds.   A stretch should never be painful. You should only feel a gentle lengthening or release in the stretched tissue.  STRETCH - Knee Extension, Prone  Lie on your stomach on a firm surface, such as a bed or countertop. Place your right / left knee and leg just beyond the edge of the surface. You may wish to place a towel under the far end of your right / left thigh for comfort.   Relax your leg muscles and allow gravity to straighten your knee. Your clinician may advise you to add an ankle weight if more resistance is helpful for you.  You should feel a stretch in the back of your right / left knee.  RANGE OF MOTION - Knee Flexion, Active  Lie on your back with both knees straight. (If this causes back discomfort, bend your opposite knee, placing your foot flat on the floor.)   Slowly slide your heel back toward your buttocks until you feel a gentle stretch in the front of your knee or thigh.     STRETCH - Quadriceps, Prone   Lie on your stomach on a firm surface, such as a bed or padded floor.   Bend your right / left knee and grasp your ankle. If you are unable to reach, your ankle or pant leg, use a belt around your foot to lengthen your reach.   Gently pull your heel toward your buttocks. Your knee should not slide out to the side. You should feel a stretch in the front of your thigh and/or knee.   STRETCH  Hamstrings, Supine   Lie on  your back. Loop a belt or towel over the ball of your right / left foot.   Straighten your right / left knee and slowly pull on the belt to raise your leg. Do not allow the right / left knee to bend. Keep your opposite leg flat on the floor.  Raise the leg until you feel a gentle stretch behind your right / left knee or thigh.  STRENGTHENING EXERCISES These exercises may help you when beginning to rehabilitate your injury. They may resolve your symptoms with or without further involvement from your physician, physical therapist or athletic trainer. While completing these exercises, remember:    Muscles can gain both the endurance and the strength needed for everyday activities through controlled exercises.   Complete these exercises as instructed by your physician, physical therapist or athletic trainer. Progress the resistance and repetitions only as guided.   You may experience muscle soreness or fatigue, but the pain or discomfort you are trying to eliminate should never worsen during these exercises. If this pain does worsen, stop and make certain you are following the directions exactly. If the pain is still present after adjustments, discontinue the exercise until you can discuss the trouble with your clinician.  STRENGTH - Quadriceps, Isometrics  Lie on your back with your right / left leg extended and your opposite knee bent.   Gradually tense the muscles in  the front of your right / left thigh. You should see either your knee cap slide up toward your hip or increased dimpling just above the knee. This motion will push the back of the knee down toward the floor/mat/bed on which you are lying.   STRENGTH - Quadriceps, Short Arcs   Lie on your back. Place a rolled  towel roll under your knee so that the knee slightly bends.   Raise only your lower leg by tightening the muscles in the front of your thigh. Do not allow your thigh to rise.     STRENGTH - Quadriceps, Straight Leg Raises   Quality counts! Watch for signs that the quadriceps muscle is working to insure you are strengthening the correct muscles and not "cheating" by substituting with healthier muscles.  Lay on your back with your right / left leg extended and your opposite knee bent.   Tense the muscles in the front of your right / left thigh. You should see either your knee cap slide up or increased dimpling just above the knee. Your thigh may even quiver.  Tighten these muscles even more and raise your leg 4 to 6 inches off the floor.   STRENGTH - Hamstring, Curls  Lay on your stomach with your legs extended. (If you lay on a bed, your feet may hang over the edge.)   Tighten the muscles in the back of your thigh to bend your right / left knee up to 90 degrees. Keep your hips flat on the bed/floor.    STRENGTH  Quadriceps, Squats  Stand in a door frame so that your feet and knees are in line with the frame.   Use your hands for balance, not support, on the frame.   Slowly lower your weight, bending at the hips and knees. Keep your lower legs upright so that they are parallel with the door frame. Squat only within the range that does not increase your knee pain. Never let your hips drop below your knees.   Slowly return upright, pushing with your legs, not pulling with your hands.   STRENGTH - Quadriceps, Wall Slides  Follow guidelines for form closely. Increased knee pain often results from poorly placed feet or knees.  Lean against a smooth wall or door and walk your feet out 18-24 inches. Place your feet hip-width apart.   Slowly slide down the wall or door until your knees bend _30________ degrees.* Keep your knees over your heels, not your toes, and in line with your hips, not falling to either side.   * Your physician, physical therapist or athletic trainer will alter this angle based on your symptoms and progress. Document Released: 06/28/2005 Document Revised: 11/06/2011 Document Reviewed:  11/26/2008 [employer] Patient Information 2013 [place].

## 2014-04-30 DIAGNOSIS — M1711 Unilateral primary osteoarthritis, right knee: Secondary | ICD-10-CM | POA: Insufficient documentation

## 2014-04-30 NOTE — Progress Notes (Signed)
Chief Complaint  Patient presents with  . Knee Pain    Right knee pain, no injury.    New patient with new problem  58 year-old female with long-standing history of osteoarthritis of the right knee previous MRI shows she has damaged cartilage without cartilaginous tear. She was told 10 years ago that she did need knee replacement but not at this time. She's been able to function very well walking cutting the grass shopping without any difficulty but she does have decreasing range of motion and occasional pain with increased activities. No catching locking or giving way at this time.  Review of systems see record documents  Past family social history  BP 154/75  Ht 5\' 5"  (1.651 m)  Wt 222 lb (100.699 kg)  BMI 36.94 kg/m2  General appearance is normal, the patient is alert and oriented x3 with normal mood and affect. She in place without assistive devices she had a knee flexion contracture  Her left knee had full range of motion mild crepitance normal strength stability and alignment  The right knee however had significant crepitance flexion contracture probably 10 in knee flexion of only 110. Tenderness along the medial compartment and patellofemoral joints. Strength was normal stability was normal alignment was in the within normal limits  Upper extremities normal  Pulse and perfusion normal both lower extremities normal sensation both lower extremities no pathologic reflexes and no lymph node swelling  X-rays show severe arthritis mainly in the patellofemoral joint with multiple ossicles of bone and possible loose bodies in the posterior compartment of the tibiofemoral compartment she still has a modest amount of joint space but definite narrowing.  The patient is really not symptomatic and not as symptomatic as her x-rays which show. At this point I would like her to do knee exercises to strengthen her knee continue functioning as she is which is very good and come back in about  3-4 months to see how she's doing. When her function her pain warrant knee replacement would be in order.

## 2014-08-06 ENCOUNTER — Encounter (HOSPITAL_COMMUNITY): Payer: Self-pay | Admitting: Vascular Surgery

## 2014-09-01 ENCOUNTER — Ambulatory Visit: Payer: BC Managed Care – PPO | Admitting: Orthopedic Surgery

## 2015-02-02 ENCOUNTER — Other Ambulatory Visit: Payer: Self-pay

## 2015-02-02 DIAGNOSIS — Z1231 Encounter for screening mammogram for malignant neoplasm of breast: Secondary | ICD-10-CM

## 2015-04-08 ENCOUNTER — Ambulatory Visit
Admission: RE | Admit: 2015-04-08 | Discharge: 2015-04-08 | Disposition: A | Discharge status: Home / Self Care | Payer: BC Managed Care – PPO | Source: Ambulatory Visit

## 2015-04-08 DIAGNOSIS — Z1231 Encounter for screening mammogram for malignant neoplasm of breast: Secondary | ICD-10-CM

## 2015-08-21 IMAGING — CT CT CTA ABD/PEL W/CM AND/OR W/O CM
4 of 9 series · 13 of 36 positions shown, 17 images · IV contrast (omnipaque)
Comparison: 05/16/2013

CLINICAL DATA: Splenic artery aneurysm.

EXAM:
CT ANGIOGRAPHY ABDOMEN AND PELVIS WITH CONTRAST AND WITHOUT CONTRAST
TECHNIQUE: Multidetector CT imaging of the abdomen and pelvis was performed
using the standard protocol during bolus administration of
intravenous contrast. Multiplanar reconstructed images including
MIPs were obtained and reviewed to evaluate the vascular anatomy.
CONTRAST:  75mL OMNIPAQUE IOHEXOL 350 MG/ML SOLN

[Series 5: arterial (id) · axial · arterial · 0.82mm/px · z∈[-350,-88]mm · 4 of 175 slices shown]
[im 35/175  soft-tissue]
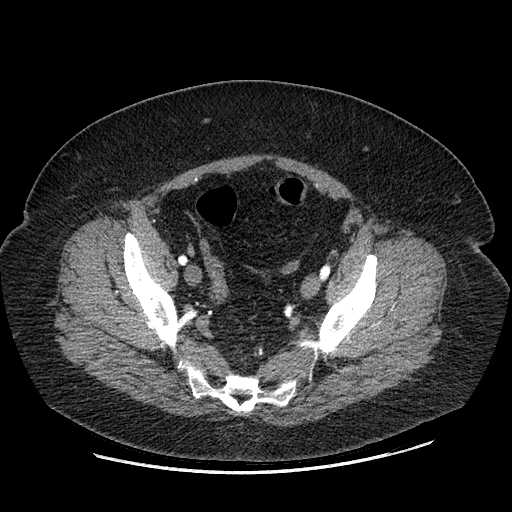
[im 70/175  soft-tissue]
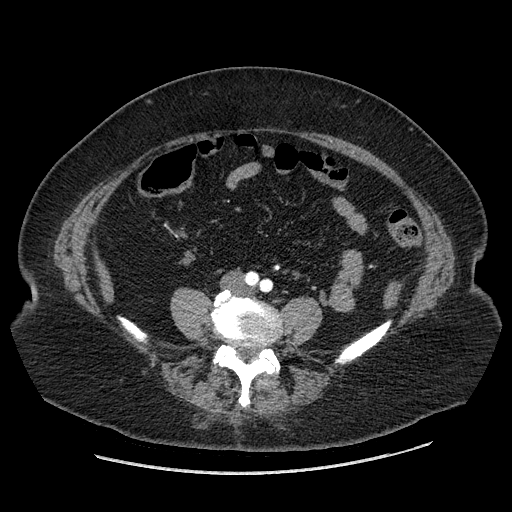
[im 105/175  soft-tissue]
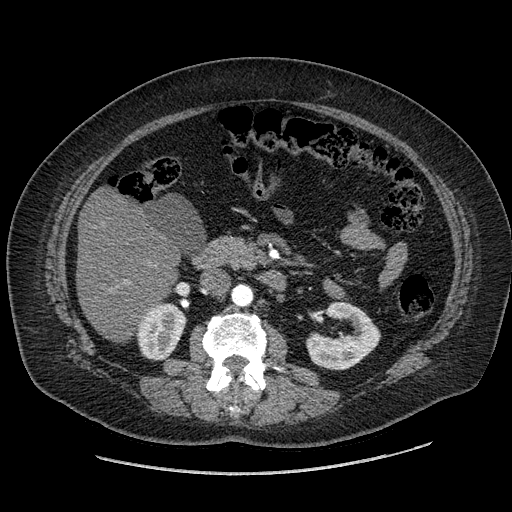
[im 140/175  soft-tissue]
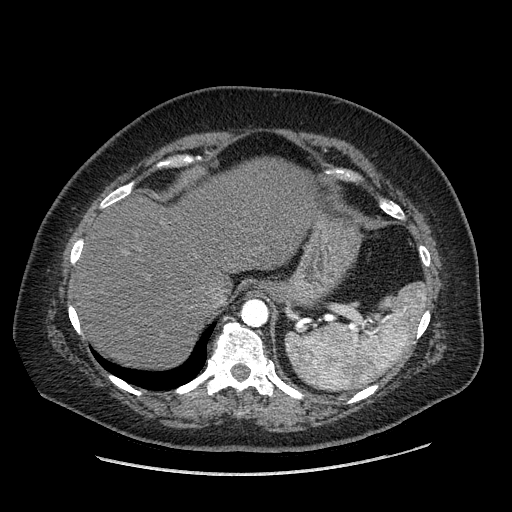

[Series 8: portal venous 5mm · axial · portal-venous · 0.82mm/px · z∈[-446,-0]mm · 3 of 90 slices shown, 7 images]
[im 1/90  soft-tissue]
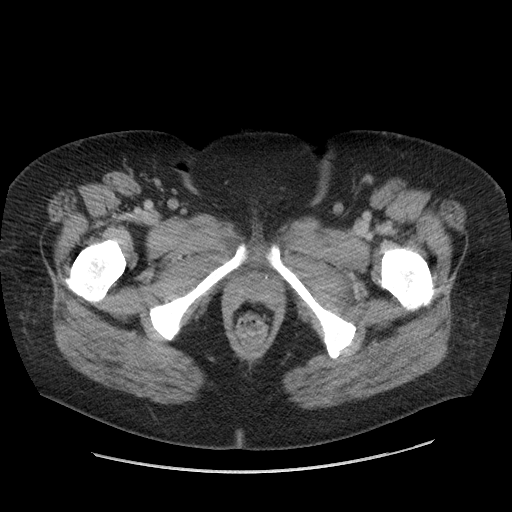
[im 1/90  lung]
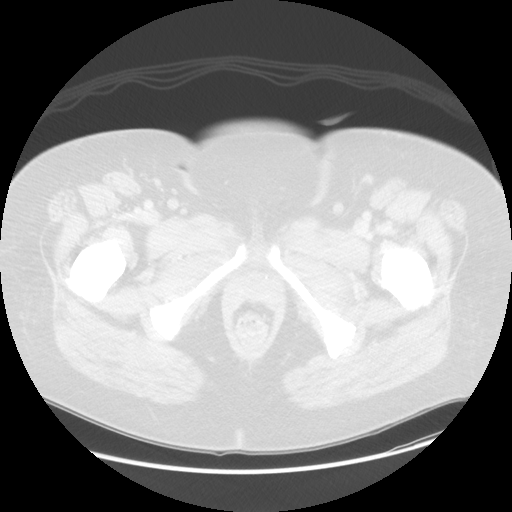
[im 1/90  bone]
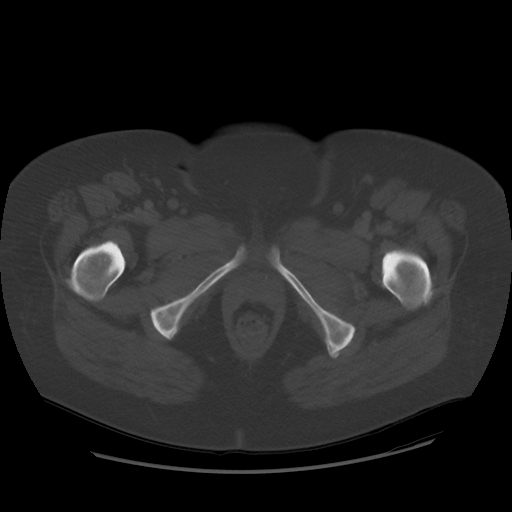
[im 45/90  soft-tissue]
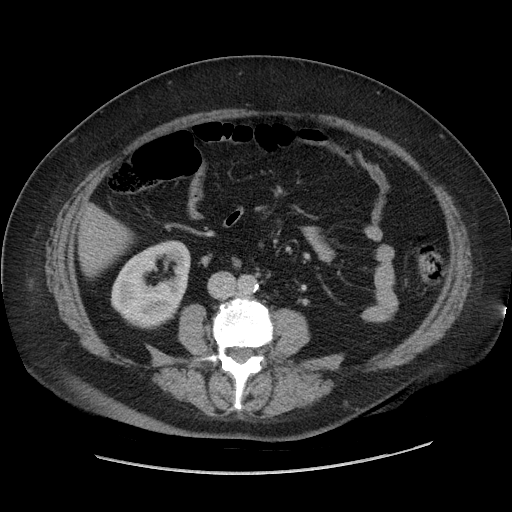
[im 45/90  lung]
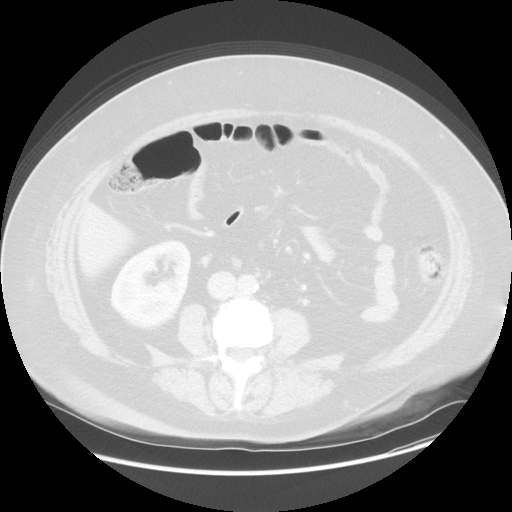
[im 90/90  soft-tissue]
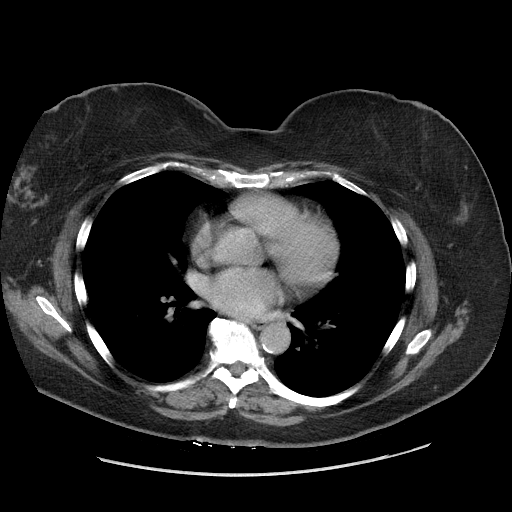
[im 90/90  lung]
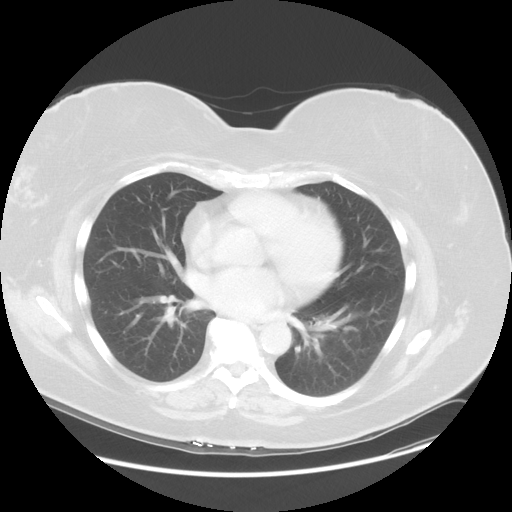

[Series 602: sagittal body · sagittal · 0.85mm/px · 3 of 169 slices shown (1 of 2)]
[im 43/169  soft-tissue]
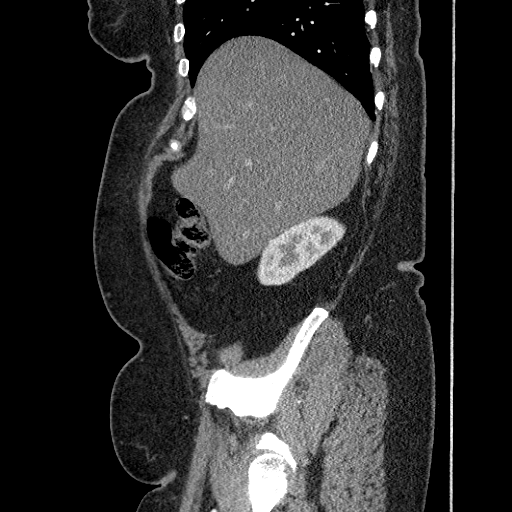
[im 85/169  soft-tissue]
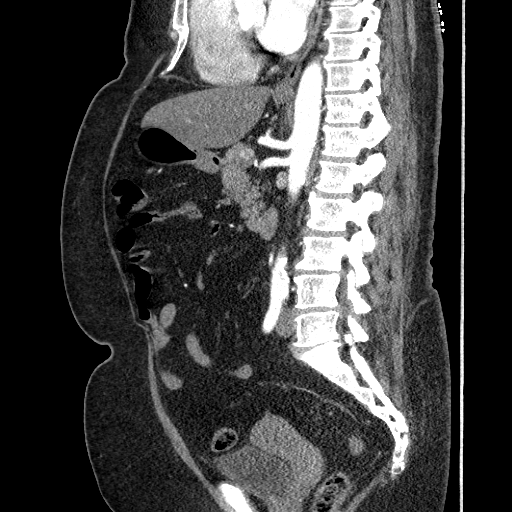
[im 127/169  soft-tissue]
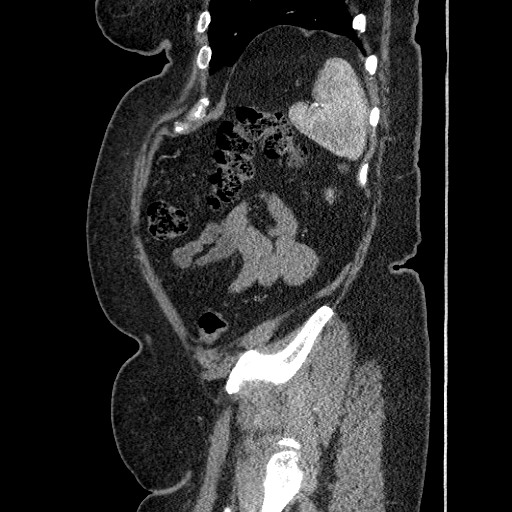

[Series 607: sagittal body · sagittal · 0.91mm/px · 3 of 169 slices shown (2 of 2)]
[im 43/169  soft-tissue]
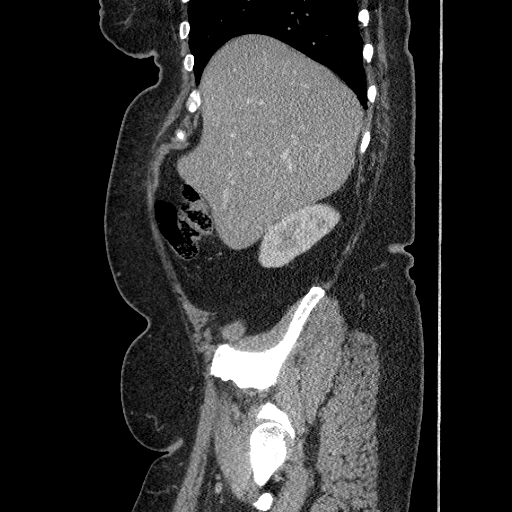
[im 85/169  soft-tissue]
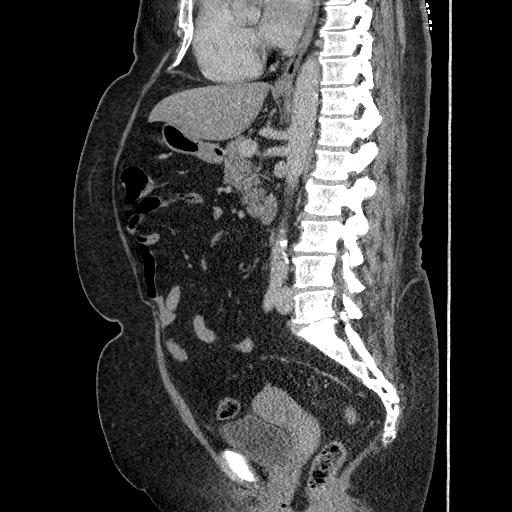
[im 127/169  soft-tissue]
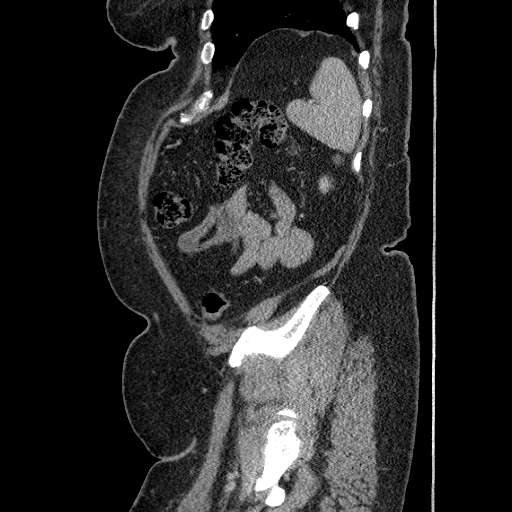

[13 of 36 positions shown; findings below may reference images not displayed]

FINDINGS: Lung bases are free of acute infiltrate or sizable effusion.

The liver is diffusely fatty infiltrated. The gallbladder is well
distended without evidence of cholelithiasis. The spleen, adrenal
glands and pancreas are within normal limits. There is evidence of a
saccular splenic artery aneurysm arising from the midportion of the
splenic artery. It has a wide neck. The aneurysm measures 3.0 x
x 2.1cm. It lies adjacent to the tail of the pancreas.

The kidneys are well visualized and demonstrate renal cystic change
on the left. No obstructive changes are noted.

The celiac axis and superior mesenteric artery are widely patent.
The inferior mesenteric artery is patent as well. Single renal
artery is noted on the left with dual renal arteries on the right.
No aortic aneurysm is seen. Scanning into the pelvis shows the
bladder to be partially distended. No pelvic mass lesion or sidewall
abnormality is noted. The appendix is within normal limits.
Degenerative change of the lumbar spine is seen.

Review of the MIP images confirms the above findings.
IMPRESSION: Splenic artery aneurysm measuring 3 cm in greatest dimension. In
lies in the midportion of the splenic artery along the tail of the
pancreas. No complicating factors are noted at this time. It shows
evidence of a wide neck.

Left renal cyst.

No other focal abnormality is noted.

## 2015-11-01 ENCOUNTER — Ambulatory Visit (HOSPITAL_COMMUNITY)
Admission: RE | Admit: 2015-11-01 | Discharge: 2015-11-01 | Disposition: A | Discharge status: Home / Self Care | Payer: BC Managed Care – PPO | Source: Ambulatory Visit | Attending: Internal Medicine | Admitting: Internal Medicine

## 2015-11-01 ENCOUNTER — Other Ambulatory Visit (HOSPITAL_COMMUNITY): Payer: Self-pay | Admitting: Internal Medicine

## 2015-11-01 DIAGNOSIS — R059 Cough, unspecified: Secondary | ICD-10-CM

## 2015-11-01 DIAGNOSIS — R0989 Other specified symptoms and signs involving the circulatory and respiratory systems: Secondary | ICD-10-CM

## 2015-11-01 DIAGNOSIS — R05 Cough: Secondary | ICD-10-CM | POA: Diagnosis present

## 2016-02-24 ENCOUNTER — Other Ambulatory Visit: Payer: Self-pay | Admitting: Obstetrics and Gynecology

## 2016-02-25 ENCOUNTER — Other Ambulatory Visit: Payer: Self-pay | Admitting: Obstetrics and Gynecology

## 2016-02-25 DIAGNOSIS — Z1231 Encounter for screening mammogram for malignant neoplasm of breast: Secondary | ICD-10-CM

## 2016-02-25 LAB — CYTOLOGY - PAP

## 2016-04-10 ENCOUNTER — Ambulatory Visit
Admission: RE | Admit: 2016-04-10 | Discharge: 2016-04-10 | Disposition: A | Discharge status: Home / Self Care | Payer: BC Managed Care – PPO | Source: Ambulatory Visit | Attending: Obstetrics and Gynecology | Admitting: Obstetrics and Gynecology

## 2016-04-10 DIAGNOSIS — Z1231 Encounter for screening mammogram for malignant neoplasm of breast: Secondary | ICD-10-CM

## 2017-02-07 ENCOUNTER — Other Ambulatory Visit: Payer: Self-pay | Admitting: Obstetrics and Gynecology

## 2017-02-07 DIAGNOSIS — Z1231 Encounter for screening mammogram for malignant neoplasm of breast: Secondary | ICD-10-CM

## 2017-04-11 ENCOUNTER — Ambulatory Visit
Admission: RE | Admit: 2017-04-11 | Discharge: 2017-04-11 | Disposition: A | Discharge status: Home / Self Care | Payer: BC Managed Care – PPO | Source: Ambulatory Visit | Attending: Obstetrics and Gynecology | Admitting: Obstetrics and Gynecology

## 2017-04-11 DIAGNOSIS — Z1231 Encounter for screening mammogram for malignant neoplasm of breast: Secondary | ICD-10-CM

## 2017-07-13 ENCOUNTER — Telehealth: Payer: Self-pay | Admitting: Nurse Practitioner

## 2017-07-13 NOTE — Telephone Encounter (Signed)
Patient notified that the lab she had done was a HgB A1C and it doesn't make a difference about fasting. Patient verbalized understanding

## 2018-02-05 ENCOUNTER — Other Ambulatory Visit: Payer: Self-pay | Admitting: Obstetrics and Gynecology

## 2018-02-05 DIAGNOSIS — Z1231 Encounter for screening mammogram for malignant neoplasm of breast: Secondary | ICD-10-CM

## 2018-02-21 DIAGNOSIS — E1169 Type 2 diabetes mellitus with other specified complication: Secondary | ICD-10-CM | POA: Diagnosis not present

## 2018-02-21 DIAGNOSIS — I10 Essential (primary) hypertension: Secondary | ICD-10-CM | POA: Diagnosis not present

## 2018-02-21 DIAGNOSIS — E6609 Other obesity due to excess calories: Secondary | ICD-10-CM | POA: Diagnosis not present

## 2018-02-21 DIAGNOSIS — G43109 Migraine with aura, not intractable, without status migrainosus: Secondary | ICD-10-CM | POA: Diagnosis not present

## 2018-04-12 ENCOUNTER — Ambulatory Visit
Admission: RE | Admit: 2018-04-12 | Discharge: 2018-04-12 | Disposition: A | Discharge status: Home / Self Care | Payer: BC Managed Care – PPO | Source: Ambulatory Visit | Attending: Obstetrics and Gynecology | Admitting: Obstetrics and Gynecology

## 2018-04-12 DIAGNOSIS — Z1231 Encounter for screening mammogram for malignant neoplasm of breast: Secondary | ICD-10-CM

## 2018-07-08 DIAGNOSIS — G43109 Migraine with aura, not intractable, without status migrainosus: Secondary | ICD-10-CM | POA: Diagnosis not present

## 2018-07-08 DIAGNOSIS — E1169 Type 2 diabetes mellitus with other specified complication: Secondary | ICD-10-CM | POA: Diagnosis not present

## 2018-07-08 DIAGNOSIS — E6609 Other obesity due to excess calories: Secondary | ICD-10-CM | POA: Diagnosis not present

## 2018-07-08 DIAGNOSIS — I10 Essential (primary) hypertension: Secondary | ICD-10-CM | POA: Diagnosis not present

## 2018-11-11 DIAGNOSIS — J309 Allergic rhinitis, unspecified: Secondary | ICD-10-CM | POA: Diagnosis not present

## 2018-11-11 DIAGNOSIS — H1045 Other chronic allergic conjunctivitis: Secondary | ICD-10-CM | POA: Diagnosis not present

## 2018-11-11 DIAGNOSIS — J029 Acute pharyngitis, unspecified: Secondary | ICD-10-CM | POA: Diagnosis not present

## 2019-04-03 DIAGNOSIS — I10 Essential (primary) hypertension: Secondary | ICD-10-CM | POA: Diagnosis not present

## 2019-04-03 DIAGNOSIS — G43109 Migraine with aura, not intractable, without status migrainosus: Secondary | ICD-10-CM | POA: Diagnosis not present

## 2019-04-03 DIAGNOSIS — E1169 Type 2 diabetes mellitus with other specified complication: Secondary | ICD-10-CM | POA: Diagnosis not present

## 2019-04-25 ENCOUNTER — Other Ambulatory Visit: Payer: Self-pay | Admitting: Obstetrics and Gynecology

## 2019-04-25 DIAGNOSIS — Z1231 Encounter for screening mammogram for malignant neoplasm of breast: Secondary | ICD-10-CM

## 2019-06-06 ENCOUNTER — Other Ambulatory Visit: Payer: Self-pay

## 2019-06-06 ENCOUNTER — Ambulatory Visit
Admission: RE | Admit: 2019-06-06 | Discharge: 2019-06-06 | Disposition: A | Discharge status: Home / Self Care | Payer: BC Managed Care – PPO | Source: Ambulatory Visit | Attending: Obstetrics and Gynecology | Admitting: Obstetrics and Gynecology

## 2019-06-06 DIAGNOSIS — Z1231 Encounter for screening mammogram for malignant neoplasm of breast: Secondary | ICD-10-CM

## 2019-07-10 DIAGNOSIS — E1165 Type 2 diabetes mellitus with hyperglycemia: Secondary | ICD-10-CM | POA: Diagnosis not present

## 2019-07-10 DIAGNOSIS — E1169 Type 2 diabetes mellitus with other specified complication: Secondary | ICD-10-CM | POA: Diagnosis not present

## 2019-07-10 DIAGNOSIS — I10 Essential (primary) hypertension: Secondary | ICD-10-CM | POA: Diagnosis not present

## 2019-07-15 DIAGNOSIS — I10 Essential (primary) hypertension: Secondary | ICD-10-CM | POA: Diagnosis not present

## 2019-07-15 DIAGNOSIS — E1169 Type 2 diabetes mellitus with other specified complication: Secondary | ICD-10-CM | POA: Diagnosis not present

## 2019-07-15 DIAGNOSIS — G43109 Migraine with aura, not intractable, without status migrainosus: Secondary | ICD-10-CM | POA: Diagnosis not present

## 2019-10-20 DIAGNOSIS — J029 Acute pharyngitis, unspecified: Secondary | ICD-10-CM | POA: Diagnosis not present

## 2019-10-20 DIAGNOSIS — E119 Type 2 diabetes mellitus without complications: Secondary | ICD-10-CM | POA: Diagnosis not present

## 2019-10-20 DIAGNOSIS — E1165 Type 2 diabetes mellitus with hyperglycemia: Secondary | ICD-10-CM | POA: Diagnosis not present

## 2019-10-20 DIAGNOSIS — H1045 Other chronic allergic conjunctivitis: Secondary | ICD-10-CM | POA: Diagnosis not present

## 2019-10-20 DIAGNOSIS — J309 Allergic rhinitis, unspecified: Secondary | ICD-10-CM | POA: Diagnosis not present

## 2019-10-20 DIAGNOSIS — E1169 Type 2 diabetes mellitus with other specified complication: Secondary | ICD-10-CM | POA: Diagnosis not present

## 2019-10-20 DIAGNOSIS — I10 Essential (primary) hypertension: Secondary | ICD-10-CM | POA: Diagnosis not present

## 2019-10-20 DIAGNOSIS — J069 Acute upper respiratory infection, unspecified: Secondary | ICD-10-CM | POA: Diagnosis not present

## 2019-10-23 DIAGNOSIS — G43109 Migraine with aura, not intractable, without status migrainosus: Secondary | ICD-10-CM | POA: Diagnosis not present

## 2019-10-23 DIAGNOSIS — E1169 Type 2 diabetes mellitus with other specified complication: Secondary | ICD-10-CM | POA: Diagnosis not present

## 2019-10-23 DIAGNOSIS — I10 Essential (primary) hypertension: Secondary | ICD-10-CM | POA: Diagnosis not present

## 2019-10-30 DIAGNOSIS — Z23 Encounter for immunization: Secondary | ICD-10-CM | POA: Diagnosis not present

## 2019-12-05 DIAGNOSIS — Z23 Encounter for immunization: Secondary | ICD-10-CM | POA: Diagnosis not present

## 2020-01-27 DIAGNOSIS — E1169 Type 2 diabetes mellitus with other specified complication: Secondary | ICD-10-CM | POA: Diagnosis not present

## 2020-01-27 DIAGNOSIS — E119 Type 2 diabetes mellitus without complications: Secondary | ICD-10-CM | POA: Diagnosis not present

## 2020-01-27 DIAGNOSIS — E1165 Type 2 diabetes mellitus with hyperglycemia: Secondary | ICD-10-CM | POA: Diagnosis not present

## 2020-01-29 DIAGNOSIS — G43109 Migraine with aura, not intractable, without status migrainosus: Secondary | ICD-10-CM | POA: Diagnosis not present

## 2020-01-29 DIAGNOSIS — I10 Essential (primary) hypertension: Secondary | ICD-10-CM | POA: Diagnosis not present

## 2020-01-29 DIAGNOSIS — E1169 Type 2 diabetes mellitus with other specified complication: Secondary | ICD-10-CM | POA: Diagnosis not present

## 2020-05-04 DIAGNOSIS — E119 Type 2 diabetes mellitus without complications: Secondary | ICD-10-CM | POA: Diagnosis not present

## 2020-05-04 DIAGNOSIS — E876 Hypokalemia: Secondary | ICD-10-CM | POA: Diagnosis not present

## 2020-05-04 DIAGNOSIS — E1165 Type 2 diabetes mellitus with hyperglycemia: Secondary | ICD-10-CM | POA: Diagnosis not present

## 2020-05-04 DIAGNOSIS — M79604 Pain in right leg: Secondary | ICD-10-CM | POA: Diagnosis not present

## 2020-05-04 DIAGNOSIS — I10 Essential (primary) hypertension: Secondary | ICD-10-CM | POA: Diagnosis not present

## 2020-05-04 DIAGNOSIS — J309 Allergic rhinitis, unspecified: Secondary | ICD-10-CM | POA: Diagnosis not present

## 2020-05-04 DIAGNOSIS — J029 Acute pharyngitis, unspecified: Secondary | ICD-10-CM | POA: Diagnosis not present

## 2020-05-06 DIAGNOSIS — G43109 Migraine with aura, not intractable, without status migrainosus: Secondary | ICD-10-CM | POA: Diagnosis not present

## 2020-05-06 DIAGNOSIS — I10 Essential (primary) hypertension: Secondary | ICD-10-CM | POA: Diagnosis not present

## 2020-05-06 DIAGNOSIS — E1169 Type 2 diabetes mellitus with other specified complication: Secondary | ICD-10-CM | POA: Diagnosis not present

## 2020-06-09 DIAGNOSIS — Z1231 Encounter for screening mammogram for malignant neoplasm of breast: Secondary | ICD-10-CM | POA: Diagnosis not present

## 2020-06-09 DIAGNOSIS — Z01419 Encounter for gynecological examination (general) (routine) without abnormal findings: Secondary | ICD-10-CM | POA: Diagnosis not present

## 2020-06-09 DIAGNOSIS — Z6835 Body mass index (BMI) 35.0-35.9, adult: Secondary | ICD-10-CM | POA: Diagnosis not present

## 2020-07-03 DIAGNOSIS — Z23 Encounter for immunization: Secondary | ICD-10-CM | POA: Diagnosis not present

## 2020-08-06 DIAGNOSIS — H25013 Cortical age-related cataract, bilateral: Secondary | ICD-10-CM | POA: Diagnosis not present

## 2020-08-06 DIAGNOSIS — E119 Type 2 diabetes mellitus without complications: Secondary | ICD-10-CM | POA: Diagnosis not present

## 2020-08-06 DIAGNOSIS — H2513 Age-related nuclear cataract, bilateral: Secondary | ICD-10-CM | POA: Diagnosis not present

## 2020-08-06 DIAGNOSIS — H524 Presbyopia: Secondary | ICD-10-CM | POA: Diagnosis not present

## 2020-08-09 DIAGNOSIS — M79604 Pain in right leg: Secondary | ICD-10-CM | POA: Diagnosis not present

## 2020-08-09 DIAGNOSIS — I10 Essential (primary) hypertension: Secondary | ICD-10-CM | POA: Diagnosis not present

## 2020-08-09 DIAGNOSIS — J029 Acute pharyngitis, unspecified: Secondary | ICD-10-CM | POA: Diagnosis not present

## 2020-08-09 DIAGNOSIS — E1165 Type 2 diabetes mellitus with hyperglycemia: Secondary | ICD-10-CM | POA: Diagnosis not present

## 2020-08-09 DIAGNOSIS — E876 Hypokalemia: Secondary | ICD-10-CM | POA: Diagnosis not present

## 2020-08-09 DIAGNOSIS — J309 Allergic rhinitis, unspecified: Secondary | ICD-10-CM | POA: Diagnosis not present

## 2020-08-09 DIAGNOSIS — E119 Type 2 diabetes mellitus without complications: Secondary | ICD-10-CM | POA: Diagnosis not present

## 2020-08-12 DIAGNOSIS — G43109 Migraine with aura, not intractable, without status migrainosus: Secondary | ICD-10-CM | POA: Diagnosis not present

## 2020-08-12 DIAGNOSIS — I10 Essential (primary) hypertension: Secondary | ICD-10-CM | POA: Diagnosis not present

## 2020-08-12 DIAGNOSIS — E1169 Type 2 diabetes mellitus with other specified complication: Secondary | ICD-10-CM | POA: Diagnosis not present

## 2020-11-05 DIAGNOSIS — I10 Essential (primary) hypertension: Secondary | ICD-10-CM | POA: Diagnosis not present

## 2020-11-05 DIAGNOSIS — M79604 Pain in right leg: Secondary | ICD-10-CM | POA: Diagnosis not present

## 2020-11-05 DIAGNOSIS — J029 Acute pharyngitis, unspecified: Secondary | ICD-10-CM | POA: Diagnosis not present

## 2020-11-05 DIAGNOSIS — E119 Type 2 diabetes mellitus without complications: Secondary | ICD-10-CM | POA: Diagnosis not present

## 2020-11-05 DIAGNOSIS — R809 Proteinuria, unspecified: Secondary | ICD-10-CM | POA: Diagnosis not present

## 2020-11-05 DIAGNOSIS — E876 Hypokalemia: Secondary | ICD-10-CM | POA: Diagnosis not present

## 2020-11-15 DIAGNOSIS — G43109 Migraine with aura, not intractable, without status migrainosus: Secondary | ICD-10-CM | POA: Diagnosis not present

## 2020-11-15 DIAGNOSIS — E1169 Type 2 diabetes mellitus with other specified complication: Secondary | ICD-10-CM | POA: Diagnosis not present

## 2020-11-15 DIAGNOSIS — I10 Essential (primary) hypertension: Secondary | ICD-10-CM | POA: Diagnosis not present

## 2020-11-15 DIAGNOSIS — E876 Hypokalemia: Secondary | ICD-10-CM | POA: Diagnosis not present

## 2021-01-11 DIAGNOSIS — Z23 Encounter for immunization: Secondary | ICD-10-CM | POA: Diagnosis not present

## 2021-03-18 ENCOUNTER — Emergency Department (HOSPITAL_COMMUNITY)
Admission: EM | Admit: 2021-03-18 | Discharge: 2021-03-18 | Disposition: A | Discharge status: Home / Self Care | Payer: BC Managed Care – PPO | Attending: Emergency Medicine | Admitting: Emergency Medicine

## 2021-03-18 ENCOUNTER — Emergency Department (HOSPITAL_COMMUNITY): Payer: BC Managed Care – PPO

## 2021-03-18 ENCOUNTER — Encounter (HOSPITAL_COMMUNITY): Payer: Self-pay | Admitting: Emergency Medicine

## 2021-03-18 DIAGNOSIS — K760 Fatty (change of) liver, not elsewhere classified: Secondary | ICD-10-CM | POA: Diagnosis not present

## 2021-03-18 DIAGNOSIS — R109 Unspecified abdominal pain: Secondary | ICD-10-CM | POA: Diagnosis not present

## 2021-03-18 DIAGNOSIS — R1011 Right upper quadrant pain: Secondary | ICD-10-CM | POA: Diagnosis not present

## 2021-03-18 DIAGNOSIS — M549 Dorsalgia, unspecified: Secondary | ICD-10-CM | POA: Diagnosis not present

## 2021-03-18 DIAGNOSIS — R52 Pain, unspecified: Secondary | ICD-10-CM

## 2021-03-18 DIAGNOSIS — R1084 Generalized abdominal pain: Secondary | ICD-10-CM | POA: Insufficient documentation

## 2021-03-18 DIAGNOSIS — E119 Type 2 diabetes mellitus without complications: Secondary | ICD-10-CM | POA: Insufficient documentation

## 2021-03-18 DIAGNOSIS — Z79899 Other long term (current) drug therapy: Secondary | ICD-10-CM | POA: Insufficient documentation

## 2021-03-18 DIAGNOSIS — I728 Aneurysm of other specified arteries: Secondary | ICD-10-CM | POA: Diagnosis not present

## 2021-03-18 DIAGNOSIS — M47814 Spondylosis without myelopathy or radiculopathy, thoracic region: Secondary | ICD-10-CM | POA: Diagnosis not present

## 2021-03-18 DIAGNOSIS — N200 Calculus of kidney: Secondary | ICD-10-CM | POA: Diagnosis not present

## 2021-03-18 DIAGNOSIS — I10 Essential (primary) hypertension: Secondary | ICD-10-CM | POA: Insufficient documentation

## 2021-03-18 DIAGNOSIS — N281 Cyst of kidney, acquired: Secondary | ICD-10-CM | POA: Diagnosis not present

## 2021-03-18 LAB — COMPREHENSIVE METABOLIC PANEL
ALT: 20 U/L (ref 0–44)
AST: 19 U/L (ref 15–41)
Albumin: 3.7 g/dL (ref 3.5–5.0)
Alkaline Phosphatase: 60 U/L (ref 38–126)
Anion gap: 9 (ref 5–15)
BUN: 15 mg/dL (ref 8–23)
CO2: 27 mmol/L (ref 22–32)
Calcium: 9.1 mg/dL (ref 8.9–10.3)
Chloride: 101 mmol/L (ref 98–111)
Creatinine, Ser: 0.87 mg/dL (ref 0.44–1.00)
GFR, Estimated: >60 mL/min (ref >60)
Glucose, Bld: 182 mg/dL — ABNORMAL HIGH (ref 70–99)
Potassium: 3.4 mmol/L — ABNORMAL LOW (ref 3.5–5.1)
Sodium: 137 mmol/L (ref 135–145)
Total Bilirubin: 0.6 mg/dL (ref 0.3–1.2)
Total Protein: 6.9 g/dL (ref 6.5–8.1)

## 2021-03-18 LAB — CBC WITH DIFFERENTIAL/PLATELET
Abs Immature Granulocytes: 0.03 10*3/uL (ref 0.00–0.07)
Basophils Absolute: 0 10*3/uL (ref 0.0–0.1)
Basophils Relative: 0 %
Eosinophils Absolute: 0.1 10*3/uL (ref 0.0–0.5)
Eosinophils Relative: 1 %
HCT: 39.2 % (ref 36.0–46.0)
Hemoglobin: 12.7 g/dL (ref 12.0–15.0)
Immature Granulocytes: 0 %
Lymphocytes Relative: 17 %
Lymphs Abs: 1.7 10*3/uL (ref 0.7–4.0)
MCH: 28.7 pg (ref 26.0–34.0)
MCHC: 32.4 g/dL (ref 30.0–36.0)
MCV: 88.5 fL (ref 80.0–100.0)
Monocytes Absolute: 0.6 10*3/uL (ref 0.1–1.0)
Monocytes Relative: 6 %
Neutro Abs: 7.2 10*3/uL (ref 1.7–7.7)
Neutrophils Relative %: 76 %
Platelets: 265 10*3/uL (ref 150–400)
RBC: 4.43 MIL/uL (ref 3.87–5.11)
RDW: 13.5 % (ref 11.5–15.5)
WBC: 9.6 10*3/uL (ref 4.0–10.5)
nRBC: 0 % (ref 0.0–0.2)

## 2021-03-18 LAB — URINALYSIS, ROUTINE W REFLEX MICROSCOPIC
Bilirubin Urine: NEGATIVE
Glucose, UA: NEGATIVE mg/dL
Hgb urine dipstick: NEGATIVE
Ketones, ur: NEGATIVE mg/dL
Leukocytes,Ua: NEGATIVE
Nitrite: NEGATIVE
Protein, ur: NEGATIVE mg/dL
Specific Gravity, Urine: 1.005 (ref 1.005–1.030)
pH: 6 (ref 5.0–8.0)

## 2021-03-18 LAB — LIPASE, BLOOD: Lipase: 33 U/L (ref 11–51)

## 2021-03-18 MED ORDER — ACETAMINOPHEN 325 MG PO TABS
650.0000 mg | ORAL_TABLET | Freq: Four times a day (QID) | ORAL | Status: DC | PRN
Start: 2021-03-18 — End: 2021-03-19
  Administered 2021-03-18: 650 mg via ORAL
  Filled 2021-03-18: qty 2

## 2021-03-18 MED ORDER — METHOCARBAMOL 500 MG PO TABS: 500.0000 mg | ORAL_TABLET | Freq: Three times a day (TID) | ORAL | 0 refills | Status: DC | PRN

## 2021-03-18 NOTE — ED Notes (Signed)
Patient transported to CT 

## 2021-03-18 NOTE — ED Provider Notes (Signed)
[facility] EMERGENCY DEPARTMENT Provider Note   CSN: [id number] Arrival date & time: 03/18/21  1245     History Chief Complaint  Patient presents with   Abdominal Pain    [place] is a 65 y.o. female.  Presents with right sided abdominal pain and right-sided back pain that has occurred for the past week. Endorses a sharp daily intermittent pain. Denies anything making symptoms worse. Not worsened with movement or with eating. Has not taken anything for pain due to concern for affecting her diabetes. During intermittent pain episodes rates pain a 10/10. Currently not in pain. Endorses nausea, no emesis. Denies fever, chills. Denies burning or frequent urination. Does endorse diarrhea and states she has IBS. Does not believe she has blood in her stool.   Past Medical History:  Diagnosis Date   Arthritis    Diabetes mellitus without complication ([place])    gestational   Endometriosis    History of migraine headaches    Hypertension    IBS (irritable bowel syndrome)     Patient Active Problem List   Diagnosis Date Noted   Arthritis of knee, right 04/30/2014   Splenic artery aneurysm ([place]) 06/25/2013   Hematuria 06/25/2013   Degenerative disc disease, lumbar 06/25/2013    Past Surgical History:  Procedure Laterality Date   BACK SURGERY     BREAST BIOPSY Left    Fibroadenoma   EMBOLIZATION  08/13/2013   Procedure: EMBOLIZATION;  Surgeon: Conrad El Moro, MD;  Location: [facility] CATH LAB;  Service: Cardiovascular;;  splenic artery aneurysm   TONSILLECTOMY AND ADENOIDECTOMY     VISCERAL ANGIOGRAM N/A 08/13/2013   Procedure: VISCERAL ANGIOGRAM;  Surgeon: Conrad Foraker, MD;  Location: [facility] CATH LAB;  Service: Cardiovascular;  Laterality: N/A;     OB History   No obstetric history on file.     Family History  Problem Relation Age of Onset   Cancer Maternal Grandfather        bladder   Deep vein thrombosis Mother    Hyperlipidemia Mother    Hypertension  Mother    Varicose Veins Mother    Other Mother        lupus anticoagulation   Diabetes Father    Hypertension Father    Peripheral vascular disease Father    Cancer Father        bladder   Cancer Maternal Grandmother        breast   Breast cancer Maternal Grandmother    Diabetes Sister     Social History   Tobacco Use   Smoking status: Never   Smokeless tobacco: Never  Substance Use Topics   Alcohol use: Yes   Drug use: No    Home Medications Prior to Admission medications   Medication Sig Start Date End Date Taking? Authorizing Provider  methocarbamol (ROBAXIN) 500 MG tablet Take 1 tablet (500 mg total) by mouth every 8 (eight) hours as needed for muscle spasms. 03/18/21  Yes Coolidge Gossard, Weldon Picking, DO  cephALEXin (KEFLEX) 500 MG capsule Take 1 capsule (500 mg total) by mouth 3 (three) times daily. 08/29/13   Mal Misty, MD  chlorthalidone (HYGROTON) 25 MG tablet Take 25 mg by mouth daily.    [provider]  metoprolol succinate (TOPROL-XL) 25 MG 24 hr tablet Take 12.5 mg by mouth daily.    [provider]  oxyCODONE-acetaminophen (PERCOCET/ROXICET) 5-325 MG per tablet Take 1-2 tablets by mouth every 6 (six) hours as needed for moderate  pain. 08/14/13   Rhyne, Hulen Shouts, PA-C    Allergies    Codeine and Sulfa antibiotics  Review of Systems   Review of Systems  Constitutional:  Negative for chills and fever.  Cardiovascular:  Negative for chest pain.  Gastrointestinal:  Positive for diarrhea and nausea. Negative for blood in stool, constipation and vomiting.  Genitourinary:  Positive for flank pain. Negative for dysuria.  All other systems reviewed and are negative.  Physical Exam Updated Vital Signs BP (!) 154/86 (BP Location: Left Arm)   Pulse 82   Temp 98.7 F (37.1 C) (Oral)   Resp 18   Ht 5' 4.5" (1.638 m)   Wt 102.1 kg   SpO2 96%   BMI 38.02 kg/m   Physical Exam Constitutional:      General: She is not in acute distress.     Appearance: She is obese.  HENT:     Head: Normocephalic and atraumatic.  Eyes:     General: No scleral icterus.    Extraocular Movements: Extraocular movements intact.  Cardiovascular:     Rate and Rhythm: Normal rate and regular rhythm.     Heart sounds: Normal heart sounds.  Pulmonary:     Effort: Pulmonary effort is normal.     Breath sounds: Normal breath sounds.  Abdominal:     General: There is no distension.     Palpations: Abdomen is soft.     Tenderness: There is generalized abdominal tenderness. There is no right CVA tenderness or left CVA tenderness.     Comments: Negative Murphy sign   Skin:    General: Skin is warm and dry.  Neurological:     Mental Status: She is alert.    ED Results / Procedures / Treatments   Labs (all labs ordered are listed, but only abnormal results are displayed) Labs Reviewed  COMPREHENSIVE METABOLIC PANEL - Abnormal; Notable for the following components:      Result Value   Potassium 3.4 (*)    Glucose, Bld 182 (*)    All other components within normal limits  URINALYSIS, ROUTINE W REFLEX MICROSCOPIC - Abnormal; Notable for the following components:   Color, Urine STRAW (*)    All other components within normal limits  CBC WITH DIFFERENTIAL/PLATELET  LIPASE, BLOOD    EKG None  Radiology CT Renal Stone Study  Result Date: 03/18/2021 CLINICAL DATA:  Right flank pain EXAM: CT ABDOMEN AND PELVIS WITHOUT CONTRAST TECHNIQUE: Multidetector CT imaging of the abdomen and pelvis was performed following the standard protocol without IV contrast. COMPARISON:  07/11/2013 FINDINGS: Lower chest: Descending thoracic aortic atherosclerotic calcification. Hepatobiliary: Diffuse hepatic steatosis. Pancreas: Partially obscured by streak artifact from the coiled left splenic artery aneurysm. Otherwise unremarkable. Spleen: Unremarkable Adrenals/Urinary Tract: 3.9 by 3.5 cm left renal cyst, image 35 series 3. Punctate calcification along the left kidney  lower pole on image 74 series 6 compatible with 1-2 mm nonobstructive renal calculus. No hydronephrosis or hydroureter. Urinary bladder unremarkable. Stomach/Bowel: Unremarkable Vascular/Lymphatic: Aortoiliac atherosclerotic vascular disease. Metal artifact from coiling of the left splenic artery aneurysm. Reproductive: Unremarkable Other: No supplemental non-categorized findings. Musculoskeletal: Thoracic and lumbar spondylosis with lower lumbar degenerative disc disease resulting in mild bilateral foraminal stenosis at L5-S1. IMPRESSION: 1. A specific cause for right flank pain is not identified. 2. Other imaging findings of potential clinical significance: Aortic Atherosclerosis (ICD10-I70.0). Diffuse hepatic steatosis. Metal coils in left splenic artery aneurysm related to prior treatment. Punctate 1-2 mm nonobstructive left kidney lower pole calculus.  Mild bilateral foraminal impingement at L5-S1. Electronically Signed   By: Van Clines M.D.   On: 03/18/2021 18:53   US Abdomen Limited RUQ (LIVER/GB)  Result Date: 03/18/2021 CLINICAL DATA:  65 year old female with right upper quadrant abdominal pain. EXAM: ULTRASOUND ABDOMEN LIMITED RIGHT UPPER QUADRANT COMPARISON:  CT of the abdomen pelvis dated 03/18/2021. FINDINGS: Gallbladder: No gallstones or wall thickening visualized. No sonographic Murphy sign noted by sonographer. Common bile duct: Diameter: 5 mm Liver: There is diffuse increased liver echogenicity most commonly seen in the setting of fatty infiltration. Superimposed inflammation or fibrosis is not excluded. Clinical correlation is recommended. Portal vein is patent on color Doppler imaging with normal direction of blood flow towards the liver. Other: None. IMPRESSION: Fatty liver, otherwise unremarkable right upper quadrant ultrasound. Electronically Signed   By: Anner Crete M.D.   On: 03/18/2021 20:28    Procedures Procedures   Medications Ordered in ED Medications   acetaminophen (TYLENOL) tablet 650 mg (650 mg Oral Given 03/18/21 2100)    ED Course  I have reviewed the triage vital signs and the nursing notes.  Pertinent labs & imaging results that were available during my care of the patient were reviewed by me and considered in my medical decision making (see chart for details).    MDM Rules/Calculators/A&P                           Ms. Schopf is a 65 yo who presents with sharp right sided abdominal pain and right-sided back pain that has occurred for the past week not worsened by movement or eating. BP elevated at 154/86. Patient in some discomfort on exam. Abdominal tenderness diffusely. Negative Murphy sign and CVA tenderness. Concern for kidney stone given flank pain vs gallbladder etiology. Low concern for appendicitis. Possibly MSK given improvement with heating pad and hot water.    CT without right flank pain findings.  Did show diffuse hepatic steatosis, prior left splenic artery aneurysm, left kidney nonobstructing calculus, mild bilateral foraminal impingement at L5-S1 [place] without gallstones or wall thickening. Does show fatty liver  UA, lipase, CMP, CBC wnl   Unsure of exact etiology. Prescribed Robaxin in the event this is MSK related. Advised to continue heating pads, OTC pain relief including lidocaine patch. Advised to follow up with PCP   Final Clinical Impression(s) / ED Diagnoses Final diagnoses:  Pain    Rx / DC Orders ED Discharge Orders          Ordered    methocarbamol (ROBAXIN) 500 MG tablet  Every 8 hours PRN        03/18/21 2102             Shary Key, DO 03/18/21 2220    Blanchie Dessert, MD 03/18/21 2340

## 2021-03-18 NOTE — Discharge Instructions (Addendum)
It was a pleasure taking care of you! You came in for right sided abdominal and back pain. Your CT was negative for kidney stone, and your ultrasound negative for gallbladder or liver pathology. I have prescribed a muscle relaxer to use as needed. You can use heating pad, and can get a lidocaine patch over the counter. I recommend following up with your PCP next week.

## 2021-03-18 NOTE — ED Notes (Signed)
Crackers given to patient.

## 2021-03-18 NOTE — ED Triage Notes (Signed)
Pt endorses right side abd pain, nausea, diarrhea and distention since Tuesday.

## 2021-03-18 NOTE — ED Provider Notes (Signed)
Emergency Medicine Provider Triage Evaluation Note  DELLAH TISLER , a 65 y.o. female  was evaluated in triage.  Pt complains of right flank pain and right abdominal pain x 1 week. Pain is intermittent, becoming more frequent.  Associated nausea, no vomiting.  No previous history of kidney stones.  Review of Systems  Positive: Right flank pain, right upper quadrant pain,nausea, Negative: Vomiting, fevers, dysuria   Physical Exam  There were no vitals taken for this visit. Gen:   Awake, no distress   Resp:  Normal effort  MSK:   Moves extremities without difficulty  Other:  Right CVAT +  Medical Decision Making  Medically screening exam initiated at 12:46 PM.  Appropriate orders placed.  TANIAH HEUERMANN was informed that the remainder of the evaluation will be completed by another provider, this initial triage assessment does not replace that evaluation, and the importance of remaining in the ED until their evaluation is complete.     Sherrill Raring, PA-C 03/18/21 1251    Quintella Reichert, MD 03/18/21 [phone]

## 2021-03-21 DIAGNOSIS — E1169 Type 2 diabetes mellitus with other specified complication: Secondary | ICD-10-CM | POA: Diagnosis not present

## 2021-03-21 DIAGNOSIS — I10 Essential (primary) hypertension: Secondary | ICD-10-CM | POA: Diagnosis not present

## 2021-03-21 DIAGNOSIS — R809 Proteinuria, unspecified: Secondary | ICD-10-CM | POA: Diagnosis not present

## 2021-03-28 ENCOUNTER — Other Ambulatory Visit (HOSPITAL_COMMUNITY): Payer: Self-pay | Admitting: Internal Medicine

## 2021-03-28 DIAGNOSIS — R1010 Upper abdominal pain, unspecified: Secondary | ICD-10-CM

## 2021-03-31 ENCOUNTER — Encounter (HOSPITAL_COMMUNITY): Payer: Self-pay

## 2021-03-31 ENCOUNTER — Other Ambulatory Visit: Payer: Self-pay

## 2021-03-31 ENCOUNTER — Encounter (HOSPITAL_COMMUNITY)
Admission: RE | Admit: 2021-03-31 | Discharge: 2021-03-31 | Disposition: A | Discharge status: Home / Self Care | Payer: BC Managed Care – PPO | Source: Ambulatory Visit | Attending: Internal Medicine | Admitting: Internal Medicine

## 2021-03-31 DIAGNOSIS — R1011 Right upper quadrant pain: Secondary | ICD-10-CM | POA: Diagnosis not present

## 2021-03-31 DIAGNOSIS — R110 Nausea: Secondary | ICD-10-CM | POA: Diagnosis not present

## 2021-03-31 DIAGNOSIS — R1010 Upper abdominal pain, unspecified: Secondary | ICD-10-CM | POA: Insufficient documentation

## 2021-03-31 MED ORDER — TECHNETIUM TC 99M MEBROFENIN IV KIT
5.0000 | PACK | Freq: Once | INTRAVENOUS | Status: AC | PRN
  Administered 2021-03-31: 5.2 via INTRAVENOUS

## 2021-04-11 ENCOUNTER — Telehealth (INDEPENDENT_AMBULATORY_CARE_PROVIDER_SITE_OTHER): Payer: Self-pay

## 2021-04-11 ENCOUNTER — Encounter (INDEPENDENT_AMBULATORY_CARE_PROVIDER_SITE_OTHER): Payer: Self-pay

## 2021-04-11 ENCOUNTER — Other Ambulatory Visit: Payer: Self-pay

## 2021-04-11 ENCOUNTER — Ambulatory Visit (INDEPENDENT_AMBULATORY_CARE_PROVIDER_SITE_OTHER): Payer: BC Managed Care – PPO | Admitting: Gastroenterology

## 2021-04-11 ENCOUNTER — Encounter (INDEPENDENT_AMBULATORY_CARE_PROVIDER_SITE_OTHER): Payer: Self-pay | Admitting: Gastroenterology

## 2021-04-11 ENCOUNTER — Other Ambulatory Visit (INDEPENDENT_AMBULATORY_CARE_PROVIDER_SITE_OTHER): Payer: Self-pay

## 2021-04-11 DIAGNOSIS — R140 Abdominal distension (gaseous): Secondary | ICD-10-CM | POA: Insufficient documentation

## 2021-04-11 DIAGNOSIS — R1010 Upper abdominal pain, unspecified: Secondary | ICD-10-CM

## 2021-04-11 DIAGNOSIS — Z1211 Encounter for screening for malignant neoplasm of colon: Secondary | ICD-10-CM

## 2021-04-11 DIAGNOSIS — K580 Irritable bowel syndrome with diarrhea: Secondary | ICD-10-CM | POA: Diagnosis not present

## 2021-04-11 DIAGNOSIS — R109 Unspecified abdominal pain: Secondary | ICD-10-CM | POA: Insufficient documentation

## 2021-04-11 DIAGNOSIS — K589 Irritable bowel syndrome without diarrhea: Secondary | ICD-10-CM | POA: Insufficient documentation

## 2021-04-11 MED ORDER — NA SULFATE-K SULFATE-MG SULF 17.5-3.13-1.6 GM/177ML PO SOLN: 1.0000 | ORAL | 0 refills | Status: DC

## 2021-04-11 NOTE — H&P (View-Only) (Signed)
[place], M.D. Gastroenterology & Hepatology [place] [address] [place], [place] [address] Primary Care Physician: [place], MD [place] [address]  Referring MD: PCP  Chief Complaint: Abdominal pain and bloating  History of Present Illness: [place] is a 65 y.o. female with past medical history of diabetes, hypertension, IBS and history of splenic artery aneurysm status post embolization, who presents for evaluation of abdominal pain and bloating.  Patient reports that around early June 2022 she presented worsening and recurrent abdominal distention in her upper abdomen.  She reports that this became bothersome and could not associated to any kind of food or trigger.  States that close to a month later she developed pain in the RUQ and epigastric area, which has been bothersome.  She states that the pain has been constant and has not improved. She reports the pain is described as a stabbing pain but occasionally feels like a bruise. She feels that sometimes there is pain elicited with her clothes. She reports that the pain sometimes comes from her back towards the RUQ. She has taken acetaminophen as needed but it does not help much improving the pain.  She reports that she has a longstanding history of IBS-D and reports having intermittent diarrhea throughout her life.  She reports that she had a bout of diarrhea on and off before going to the ER on 03/18/2021 for evaluation of her abdominal pain. The patient went to [facility] where she had blood work-up including CBC, CMP and a urinalysis that showed presence of mildly decreased potassium of 3.4 but the rest of her electrolytes, renal function, liver enzymes, blood count were within normal limits.  Due to the severity of her pain she subsequently underwent a CT of the abdomen and pelvis without IV contrast which showed presence of a left kidney cyst but  no presence of any other significant alterations, she had some hepatic steatosis and metallic coils from previous embolization but no other abnormalities.  An ultrasound of the right upper quadrant showed fatty liver without any alterations.  Due to this she was scheduled to have a HIDA scan as outpatient which she performed on 03/31/2021, the ejection fraction was borderline (32%) but otherwise normal.  She reports having watery bowel movements in multiple times, with some occasional accidents.  States that more recently her bowel movemetns have normalized, has 1-2 BM per day, does not take any antidiarrheals.  She reports that she intermittently has some black stools although they are not tarry but denies any hematochezia.  The patient denies having any nausea, vomiting, fever, chills, hematochezia, melena, hematemesis, jaundice, pruritus or weight loss.  She has a difficult airway based on previous anesthesia notes.  FHx: neg for any gastrointestinal/liver [place], grandmother breast cancer, father kidney cancer, grandfather liver cancer Social: neg smoking, alcohol or illicit drug use Surgical: claparoscopic xploration for endometriosis  Last EGD: never Last Colonoscopy: possibly 12 years ago, patient believes she had a polyp removed  Past Medical History: Past Medical History:  Diagnosis Date   Arthritis    Diabetes mellitus without complication ([place])    gestational   Endometriosis    History of migraine headaches    Hypertension    IBS (irritable bowel syndrome)     Past Surgical History: Past Surgical History:  Procedure Laterality Date   BACK SURGERY     BREAST BIOPSY Left    Fibroadenoma   EMBOLIZATION  08/13/2013   Procedure: EMBOLIZATION;  Surgeon: [place], MD;  Location: [facility] CATH LAB;  Service: Cardiovascular;;  splenic artery aneurysm   TONSILLECTOMY AND ADENOIDECTOMY     VISCERAL ANGIOGRAM N/A 08/13/2013   Procedure: VISCERAL ANGIOGRAM;  Surgeon: [place],  MD;  Location: [facility] CATH LAB;  Service: Cardiovascular;  Laterality: N/A;    Family History: Family History  Problem Relation Age of Onset   Cancer Maternal Grandfather        bladder   Deep vein thrombosis Mother    Hyperlipidemia Mother    Hypertension Mother    Varicose Veins Mother    Other Mother        lupus anticoagulation   Diabetes Father    Hypertension Father    Peripheral vascular [place] Father    Cancer Father        bladder   Cancer Maternal Grandmother        breast   Breast cancer Maternal Grandmother    Diabetes Sister     Social History: Social History   Tobacco Use  Smoking Status Never  Smokeless Tobacco Never   Social History   Substance and Sexual Activity  Alcohol Use Not Currently   Social History   Substance and Sexual Activity  Drug Use No    Allergies: Allergies  Allergen Reactions   Codeine    Sulfa Antibiotics     Medications: Current Outpatient Medications  Medication Sig Dispense Refill   chlorthalidone (HYGROTON) 25 MG tablet Take 25 mg by mouth daily. 1/2 tablet daily per patient.     Insulin Glargine (TOUJEO MAX SOLOSTAR [place]) Inject 42 Units into the skin daily at 6 (six) AM.     linaGLIPtin-metFORMIN HCl ER (JENTADUETO XR) 12-998 MG TB24 Take by mouth daily.     metoprolol succinate (TOPROL-XL) 25 MG 24 hr tablet Take 12.5 mg by mouth daily.     No current facility-administered medications for this visit.    Review of Systems: GENERAL: negative for malaise, night sweats HEENT: No changes in hearing or vision, no nose bleeds or other nasal problems. NECK: Negative for lumps, goiter, pain and significant neck swelling RESPIRATORY: Negative for cough, wheezing CARDIOVASCULAR: Negative for chest pain, leg swelling, palpitations, orthopnea GI: SEE HPI MUSCULOSKELETAL: Negative for joint pain or swelling, back pain, and muscle pain. SKIN: Negative for lesions, rash PSYCH: Negative for sleep disturbance, mood disorder and  recent psychosocial stressors. HEMATOLOGY Negative for prolonged bleeding, bruising easily, and swollen nodes. ENDOCRINE: Negative for cold or heat intolerance, polyuria, polydipsia and goiter. NEURO: negative for tremor, gait imbalance, syncope and seizures. The remainder of the review of systems is noncontributory.   Physical Exam: BP (!) 153/82 (BP Location: Right Arm, Patient Position: Sitting, Cuff Size: Large)   Pulse 92   Temp 98 F (36.7 C) (Oral)   Ht 5' 4.5" (1.638 m)   Wt 226 lb 11.2 oz (102.8 kg)   BMI 38.31 kg/m  GENERAL: The patient is AO x3, in no acute distress. Obese. HEENT: Head is normocephalic and atraumatic. EOMI are intact. Mouth is well hydrated and without lesions. NECK: Supple. No masses LUNGS: Clear to auscultation. No presence of rhonchi/wheezing/rales. Adequate chest expansion HEART: RRR, normal s1 and s2. ABDOMEN: Mildly tender upon palpation of the right flank and epigastric area, no guarding, no peritoneal signs.  Mildly distended in the upper abdomen.. BS +. No masses but there is some mild hepatomegaly in the right flank. EXTREMITIES: Without any cyanosis, clubbing, rash,  lesions or edema. NEUROLOGIC: AOx3, no focal motor deficit. SKIN: no jaundice, no rashes   Imaging/Labs: as above  I personally reviewed and interpreted the available labs, imaging and endoscopic files.  Impression and Plan: [place] is a 65 y.o. female with past medical history of diabetes, hypertension, IBS and history of splenic artery aneurysm status post embolization, who presents for evaluation of abdominal pain and bloating.  The patient has persistent abdominal pain with negative imaging investigations including a CT scan, liver ultrasound and a HIDA scan.  Even though the patient has presence of mild enlargement of the liver, there was no presence of any alterations on imaging besides of the presence of fatty liver.  Her liver enzymes were normal which goes against  active inflammation (NASH).  It is possible that her symptoms are related to exacerbation of her IBS, for which I provided the patient a dietary list but also she can benefit from using peppermint to improve her symptoms as she wants a more "natural" way to treat her symptoms.  Nevertheless, given the persistence of symptoms we will investigate this further with an EGD.  She is due for a colonoscopy which will be scheduled on the same day of her esophagogastroduodenospy.  -Explained presumed etiology of IBS symptoms. Patient was counseled about the benefit of implementing a low FODMAP to improve symptoms and recurrent episodes. A dietary list was provided to the patient. Also, the patient was counseled about the benefit of avoiding stressing situations and potential environmental triggers leading to symptomatology. -Start IBGard/edible peppermint oil 1 tablet every 8-12 hours as needed for abdominal pain and bloating -Schedule EGD with SB biopsies and colonoscopy  All questions were answered.      [place], MD Gastroenterology and Hepatology [facility]

## 2021-04-11 NOTE — Progress Notes (Signed)
Raven Green, M.D. Gastroenterology & Hepatology [facility] [address] [place], [place] [address] Primary Care Physician: Celene Squibb, MD [place] [address]  Referring MD: PCP  Chief Complaint: Abdominal pain and bloating  History of Present Illness: Raven Green is a 65 y.o. female with past medical history of diabetes, hypertension, IBS and history of splenic artery aneurysm status post embolization, who presents for evaluation of abdominal pain and bloating.  Patient reports that around early June 2022 she presented worsening and recurrent abdominal distention in her upper abdomen.  She reports that this became bothersome and could not associated to any kind of food or trigger.  States that close to a month later she developed pain in the RUQ and epigastric area, which has been bothersome.  She states that the pain has been constant and has not improved. She reports the pain is described as a stabbing pain but occasionally feels like a bruise. She feels that sometimes there is pain elicited with her clothes. She reports that the pain sometimes comes from her back towards the RUQ. She has taken acetaminophen as needed but it does not help much improving the pain.  She reports that she has a longstanding history of IBS-D and reports having intermittent diarrhea throughout her life.  She reports that she had a bout of diarrhea on and off before going to the ER on 03/18/2021 for evaluation of her abdominal pain. The patient went to [facility] where she had blood work-up including CBC, CMP and a urinalysis that showed presence of mildly decreased potassium of 3.4 but the rest of her electrolytes, renal function, liver enzymes, blood count were within normal limits.  Due to the severity of her pain she subsequently underwent a CT of the abdomen and pelvis without IV contrast which showed presence of a left kidney cyst but  no presence of any other significant alterations, she had some hepatic steatosis and metallic coils from previous embolization but no other abnormalities.  An ultrasound of the right upper quadrant showed fatty liver without any alterations.  Due to this she was scheduled to have a HIDA scan as outpatient which she performed on 03/31/2021, the ejection fraction was borderline (32%) but otherwise normal.  She reports having watery bowel movements in multiple times, with some occasional accidents.  States that more recently her bowel movemetns have normalized, has 1-2 BM per day, does not take any antidiarrheals.  She reports that she intermittently has some black stools although they are not tarry but denies any hematochezia.  The patient denies having any nausea, vomiting, fever, chills, hematochezia, melena, hematemesis, jaundice, pruritus or weight loss.  She has a difficult airway based on previous anesthesia notes.  FHx: neg for any gastrointestinal/liver disease, grandmother breast cancer, father kidney cancer, grandfather liver cancer Social: neg smoking, alcohol or illicit drug use Surgical: claparoscopic xploration for endometriosis  Last EGD: never Last Colonoscopy: possibly 12 years ago, patient believes she had a polyp removed  Past Medical History: Past Medical History:  Diagnosis Date   Arthritis    Diabetes mellitus without complication ([facility])    gestational   Endometriosis    History of migraine headaches    Hypertension    IBS (irritable bowel syndrome)     Past Surgical History: Past Surgical History:  Procedure Laterality Date   BACK SURGERY     BREAST BIOPSY Left    Fibroadenoma   EMBOLIZATION  08/13/2013   Procedure: EMBOLIZATION;  Surgeon: Conrad [place], MD;  Location: [facility] CATH LAB;  Service: Cardiovascular;;  splenic artery aneurysm   TONSILLECTOMY AND ADENOIDECTOMY     VISCERAL ANGIOGRAM N/A 08/13/2013   Procedure: VISCERAL ANGIOGRAM;  Surgeon: Conrad Claver[place],  MD;  Location: [facility] CATH LAB;  Service: Cardiovascular;  Laterality: N/A;    Family History: Family History  Problem Relation Age of Onset   Cancer Maternal Grandfather        bladder   Deep vein thrombosis Mother    Hyperlipidemia Mother    Hypertension Mother    Varicose Veins Mother    Other Mother        lupus anticoagulation   Diabetes Father    Hypertension Father    Peripheral vascular disease Father    Cancer Father        bladder   Cancer Maternal Grandmother        breast   Breast cancer Maternal Grandmother    Diabetes Sister     Social History: Social History   Tobacco Use  Smoking Status Never  Smokeless Tobacco Never   Social History   Substance and Sexual Activity  Alcohol Use Not Currently   Social History   Substance and Sexual Activity  Drug Use No    Allergies: Allergies  Allergen Reactions   Codeine    Sulfa Antibiotics     Medications: Current Outpatient Medications  Medication Sig Dispense Refill   chlorthalidone (HYGROTON) 25 MG tablet Take 25 mg by mouth daily. 1/2 tablet daily per patient.     Insulin Glargine (TOUJEO MAX SOLOSTAR [place]) Inject 42 Units into the skin daily at 6 (six) AM.     linaGLIPtin-metFORMIN HCl ER (JENTADUETO XR) 12-998 MG TB24 Take by mouth daily.     metoprolol succinate (TOPROL-XL) 25 MG 24 hr tablet Take 12.5 mg by mouth daily.     No current facility-administered medications for this visit.    Review of Systems: GENERAL: negative for malaise, night sweats HEENT: No changes in hearing or vision, no nose bleeds or other nasal problems. NECK: Negative for lumps, goiter, pain and significant neck swelling RESPIRATORY: Negative for cough, wheezing CARDIOVASCULAR: Negative for chest pain, leg swelling, palpitations, orthopnea GI: SEE HPI MUSCULOSKELETAL: Negative for joint pain or swelling, back pain, and muscle pain. SKIN: Negative for lesions, rash PSYCH: Negative for sleep disturbance, mood disorder and  recent psychosocial stressors. HEMATOLOGY Negative for prolonged bleeding, bruising easily, and swollen nodes. ENDOCRINE: Negative for cold or heat intolerance, polyuria, polydipsia and goiter. NEURO: negative for tremor, gait imbalance, syncope and seizures. The remainder of the review of systems is noncontributory.   Physical Exam: BP (!) 153/82 (BP Location: Right Arm, Patient Position: Sitting, Cuff Size: Large)   Pulse 92   Temp 98 F (36.7 C) (Oral)   Ht 5' 4.5" (1.638 m)   Wt 226 lb 11.2 oz (102.8 kg)   BMI 38.31 kg/m  GENERAL: The patient is AO x3, in no acute distress. Obese. HEENT: Head is normocephalic and atraumatic. EOMI are intact. Mouth is well hydrated and without lesions. NECK: Supple. No masses LUNGS: Clear to auscultation. No presence of rhonchi/wheezing/rales. Adequate chest expansion HEART: RRR, normal s1 and s2. ABDOMEN: Mildly tender upon palpation of the right flank and epigastric area, no guarding, no peritoneal signs.  Mildly distended in the upper abdomen.. BS +. No masses but there is some mild hepatomegaly in the right flank. EXTREMITIES: Without any cyanosis, clubbing, rash,  lesions or edema. NEUROLOGIC: AOx3, no focal motor deficit. SKIN: no jaundice, no rashes   Imaging/Labs: as above  I personally reviewed and interpreted the available labs, imaging and endoscopic files.  Impression and Plan: REVEILLE GUHL is a 65 y.o. female with past medical history of diabetes, hypertension, IBS and history of splenic artery aneurysm status post embolization, who presents for evaluation of abdominal pain and bloating.  The patient has persistent abdominal pain with negative imaging investigations including a CT scan, liver ultrasound and a HIDA scan.  Even though the patient has presence of mild enlargement of the liver, there was no presence of any alterations on imaging besides of the presence of fatty liver.  Her liver enzymes were normal which goes against  active inflammation (NASH).  It is possible that her symptoms are related to exacerbation of her IBS, for which I provided the patient a dietary list but also she can benefit from using peppermint to improve her symptoms as she wants a more "natural" way to treat her symptoms.  Nevertheless, given the persistence of symptoms we will investigate this further with an EGD.  She is due for a colonoscopy which will be scheduled on the same day of her esophagogastroduodenospy.  -Explained presumed etiology of IBS symptoms. Patient was counseled about the benefit of implementing a low FODMAP to improve symptoms and recurrent episodes. A dietary list was provided to the patient. Also, the patient was counseled about the benefit of avoiding stressing situations and potential environmental triggers leading to symptomatology. -Start IBGard/edible peppermint oil 1 tablet every 8-12 hours as needed for abdominal pain and bloating -Schedule EGD with SB biopsies and colonoscopy  All questions were answered.      Raven Peppers, MD Gastroenterology and Hepatology [facility]

## 2021-04-11 NOTE — Telephone Encounter (Signed)
Raven Green, CMA  

## 2021-04-11 NOTE — Patient Instructions (Addendum)
Explained presumed etiology of IBS symptoms. Patient was counseled about the benefit of implementing a low FODMAP to improve symptoms and recurrent episodes. A dietary list was provided to the patient. Also, the patient was counseled about the benefit of avoiding stressing situations and potential environmental triggers leading to symptomatology. Start IBGard/edible peppermint oil 1 tablet every 8-12 hours as needed for abdominal pain and bloating Schedule EGD and colonoscopy

## 2021-04-20 ENCOUNTER — Ambulatory Visit (HOSPITAL_COMMUNITY)
Admission: RE | Admit: 2021-04-20 | Discharge: 2021-04-20 | Disposition: A | Discharge status: Home / Self Care | Payer: BC Managed Care – PPO | Attending: Gastroenterology | Admitting: Gastroenterology

## 2021-04-20 ENCOUNTER — Ambulatory Visit (HOSPITAL_COMMUNITY): Payer: BC Managed Care – PPO | Admitting: Anesthesiology

## 2021-04-20 ENCOUNTER — Encounter (HOSPITAL_COMMUNITY)
Admission: RE | Disposition: A | Discharge status: Home / Self Care | Payer: Self-pay | Source: Home / Self Care | Attending: Gastroenterology

## 2021-04-20 ENCOUNTER — Other Ambulatory Visit: Payer: Self-pay

## 2021-04-20 DIAGNOSIS — Z794 Long term (current) use of insulin: Secondary | ICD-10-CM | POA: Insufficient documentation

## 2021-04-20 DIAGNOSIS — Z885 Allergy status to narcotic agent status: Secondary | ICD-10-CM | POA: Insufficient documentation

## 2021-04-20 DIAGNOSIS — Z833 Family history of diabetes mellitus: Secondary | ICD-10-CM | POA: Diagnosis not present

## 2021-04-20 DIAGNOSIS — D120 Benign neoplasm of cecum: Secondary | ICD-10-CM | POA: Insufficient documentation

## 2021-04-20 DIAGNOSIS — I10 Essential (primary) hypertension: Secondary | ICD-10-CM | POA: Insufficient documentation

## 2021-04-20 DIAGNOSIS — Z8349 Family history of other endocrine, nutritional and metabolic diseases: Secondary | ICD-10-CM | POA: Diagnosis not present

## 2021-04-20 DIAGNOSIS — R1013 Epigastric pain: Secondary | ICD-10-CM | POA: Diagnosis not present

## 2021-04-20 DIAGNOSIS — E119 Type 2 diabetes mellitus without complications: Secondary | ICD-10-CM | POA: Insufficient documentation

## 2021-04-20 DIAGNOSIS — D125 Benign neoplasm of sigmoid colon: Secondary | ICD-10-CM | POA: Diagnosis not present

## 2021-04-20 DIAGNOSIS — K580 Irritable bowel syndrome with diarrhea: Secondary | ICD-10-CM | POA: Diagnosis not present

## 2021-04-20 DIAGNOSIS — Z8052 Family history of malignant neoplasm of bladder: Secondary | ICD-10-CM | POA: Insufficient documentation

## 2021-04-20 DIAGNOSIS — Z1211 Encounter for screening for malignant neoplasm of colon: Secondary | ICD-10-CM | POA: Diagnosis not present

## 2021-04-20 DIAGNOSIS — Z8249 Family history of ischemic heart disease and other diseases of the circulatory system: Secondary | ICD-10-CM | POA: Insufficient documentation

## 2021-04-20 DIAGNOSIS — R1011 Right upper quadrant pain: Secondary | ICD-10-CM | POA: Insufficient documentation

## 2021-04-20 DIAGNOSIS — K31A0 Gastric intestinal metaplasia, unspecified: Secondary | ICD-10-CM | POA: Diagnosis not present

## 2021-04-20 DIAGNOSIS — Z8679 Personal history of other diseases of the circulatory system: Secondary | ICD-10-CM | POA: Diagnosis not present

## 2021-04-20 DIAGNOSIS — Z79899 Other long term (current) drug therapy: Secondary | ICD-10-CM | POA: Diagnosis not present

## 2021-04-20 DIAGNOSIS — K635 Polyp of colon: Secondary | ICD-10-CM | POA: Diagnosis not present

## 2021-04-20 DIAGNOSIS — Z803 Family history of malignant neoplasm of breast: Secondary | ICD-10-CM | POA: Insufficient documentation

## 2021-04-20 DIAGNOSIS — R1010 Upper abdominal pain, unspecified: Secondary | ICD-10-CM

## 2021-04-20 DIAGNOSIS — K31A19 Gastric intestinal metaplasia without dysplasia, unspecified site: Secondary | ICD-10-CM | POA: Diagnosis not present

## 2021-04-20 DIAGNOSIS — Z882 Allergy status to sulfonamides status: Secondary | ICD-10-CM | POA: Insufficient documentation

## 2021-04-20 DIAGNOSIS — R140 Abdominal distension (gaseous): Secondary | ICD-10-CM | POA: Diagnosis not present

## 2021-04-20 DIAGNOSIS — K319 Disease of stomach and duodenum, unspecified: Secondary | ICD-10-CM | POA: Diagnosis not present

## 2021-04-20 HISTORY — PX: BIOPSY: SHX5522

## 2021-04-20 HISTORY — PX: COLONOSCOPY WITH PROPOFOL: SHX5780

## 2021-04-20 HISTORY — PX: ESOPHAGOGASTRODUODENOSCOPY (EGD) WITH PROPOFOL: SHX5813

## 2021-04-20 HISTORY — PX: POLYPECTOMY: SHX5525

## 2021-04-20 LAB — GLUCOSE, CAPILLARY: Glucose-Capillary: 147 mg/dL — ABNORMAL HIGH (ref 70–99)

## 2021-04-20 LAB — HM COLONOSCOPY

## 2021-04-20 SURGERY — ESOPHAGOGASTRODUODENOSCOPY (EGD) WITH PROPOFOL
Anesthesia: General

## 2021-04-20 MED ORDER — LIDOCAINE HCL (CARDIAC) PF 50 MG/5ML IV SOSY
PREFILLED_SYRINGE | INTRAVENOUS | Status: DC | PRN
  Administered 2021-04-20: 100 mg via INTRAVENOUS

## 2021-04-20 MED ORDER — PROPOFOL 500 MG/50ML IV EMUL
INTRAVENOUS | Status: DC | PRN
  Administered 2021-04-20 (×2): 100 ug/kg/min via INTRAVENOUS

## 2021-04-20 MED ORDER — STERILE WATER FOR IRRIGATION IR SOLN
Status: DC | PRN
  Administered 2021-04-20: 200 mL

## 2021-04-20 MED ORDER — LIDOCAINE VISCOUS HCL 2 % MT SOLN
15.0000 mL | Freq: Once | OROMUCOSAL | Status: AC
  Administered 2021-04-20: 15 mL via OROMUCOSAL

## 2021-04-20 MED ORDER — DICYCLOMINE HCL 10 MG PO CAPS: 10.0000 mg | ORAL_CAPSULE | Freq: Two times a day (BID) | ORAL | 2 refills | Status: DC | PRN

## 2021-04-20 MED ORDER — PROPOFOL 10 MG/ML IV BOLUS
INTRAVENOUS | Status: DC | PRN
  Administered 2021-04-20: 60 mg via INTRAVENOUS
  Administered 2021-04-20: 150 mg via INTRAVENOUS
  Administered 2021-04-20: 30 mg via INTRAVENOUS
  Administered 2021-04-20: 20 mg via INTRAVENOUS

## 2021-04-20 MED ORDER — LIDOCAINE VISCOUS HCL 2 % MT SOLN
OROMUCOSAL | Status: AC
  Filled 2021-04-20: qty 15

## 2021-04-20 MED ORDER — LACTATED RINGERS IV SOLN: INTRAVENOUS | Status: DC

## 2021-04-20 NOTE — Op Note (Signed)
[facility] Patient Name: Raven Green Procedure Date: 04/20/2021 9:41 AM MRN: [id number] Date of Birth: 1955-09-04 Attending MD: [name] ,  CSN: [id number] Age: 65 Admit Type: Outpatient Procedure:                Colonoscopy Indications:              Screening for colorectal malignant neoplasm Providers:                [name], Caprice Kluver, Aram Candela Referring MD:              Medicines:                Monitored Anesthesia Care Complications:            No immediate complications. Estimated Blood Loss:     Estimated blood loss: none. Procedure:                Pre-Anesthesia Assessment:                           - Prior to the procedure, a History and Physical                            was performed, and patient medications, allergies                            and sensitivities were reviewed. The patient's                            tolerance of previous anesthesia was reviewed.                           - The risks and benefits of the procedure and the                            sedation options and risks were discussed with the                            patient. All questions were answered and informed                            consent was obtained.                           - ASA Grade Assessment: III - A patient with severe                            systemic disease.                           After obtaining informed consent, the colonoscope                            was passed under direct vision. Throughout the                            procedure, the patient's blood pressure, pulse,  and                            oxygen saturations were monitored continuously. The                            [id number] scope was introduced through                            the anus and advanced to the the terminal ileum.                            The colonoscopy was performed without difficulty.                            The patient tolerated the  procedure well. The                            quality of the bowel preparation was good. Scope In: 9:43:39 AM Scope Out: 10:03:49 AM Scope Withdrawal Time: 0 hours 16 minutes 57 seconds  Total Procedure Duration: 0 hours 20 minutes 10 seconds  Findings:      The perianal and digital rectal examinations were normal.      The terminal ileum appeared normal.      Two sessile polyps were found in the sigmoid colon and cecum. The polyps       were 4 to 5 mm in size. These polyps were removed with a cold snare.       Resection and retrieval were complete.      The retroflexed view of the distal rectum and anal verge was normal and       showed no anal or rectal abnormalities. Impression:               - The examined portion of the ileum was normal.                           - Two 4 to 5 mm polyps in the sigmoid colon and in                            the cecum, removed with a cold snare. Resected and                            retrieved.                           - The distal rectum and anal verge are normal on                            retroflexion view. Moderate Sedation:      Per Anesthesia Care Recommendation:           - Discharge patient to home (ambulatory).                           - Resume previous diet.                           -  Await pathology results.                           - Repeat colonoscopy for surveillance based on                            pathology results. Procedure Code(s):        --- Professional ---                           [phone], Colonoscopy, flexible; with removal of                            tumor(s), polyp(s), or other lesion(s) by snare                            technique Diagnosis Code(s):        --- Professional ---                           Z12.11, Encounter for screening for malignant                            neoplasm of colon                           K63.5, Polyp of colon CPT copyright 2019 American Medical Association. All rights  reserved. The codes documented in this report are preliminary and upon coder review may  be revised to meet current compliance requirements. [name], MD [name],  04/20/2021 10:07:02 AM This report has been signed electronically. Number of Addenda: 0

## 2021-04-20 NOTE — Transfer of Care (Signed)
Immediate Anesthesia Transfer of Care Note  Patient: Raven Green  Procedure(s) Performed: ESOPHAGOGASTRODUODENOSCOPY (EGD) WITH PROPOFOL COLONOSCOPY WITH PROPOFOL BIOPSY POLYPECTOMY  Patient Location: Endoscopy Unit  Anesthesia Type:General  Level of Consciousness: awake and patient cooperative  Airway & Oxygen Therapy: Patient Spontanous Breathing  Post-op Assessment: Report given to RN and Post -op Vital signs reviewed and stable  Post vital signs: Reviewed and stable  Last Vitals:  Vitals Value Taken Time  BP    Temp    Pulse    Resp    SpO2     SEE VITAL SIGN FLOW SHEET Last Pain:  Vitals:   04/20/21 0928  TempSrc:   PainSc: 7       Patients Stated Pain Goal: 9 [phone])  Complications: No notable events documented.

## 2021-04-20 NOTE — Anesthesia Preprocedure Evaluation (Addendum)
Anesthesia Evaluation  Patient identified by MRN, date of birth, ID band Patient awake    Reviewed: Allergy & Precautions, NPO status , Patient's Chart, lab work & pertinent test results, reviewed documented beta blocker date and time   History of Anesthesia Complications (+) DIFFICULT AIRWAY and history of anesthetic complications (difficult intubation and needed small tube to intubate for back sx)  Airway Mallampati: II  TM Distance: >3 FB Neck ROM: Full   Comment: H/o difficult airway Dental  (+) Dental Advisory Given Crowns :   Pulmonary neg pulmonary ROS,    Pulmonary exam normal breath sounds clear to auscultation       Cardiovascular Exercise Tolerance: Good hypertension, Pt. on medications and Pt. on home beta blockers + DVT (splenic artery aneurysm)  Normal cardiovascular exam Rhythm:Regular Rate:Normal     Neuro/Psych negative neurological ROS  negative psych ROS   GI/Hepatic negative GI ROS, Neg liver ROS,   Endo/Other  diabetes, Well Controlled, Type 2, Insulin Dependent  Renal/GU negative Renal ROS     Musculoskeletal  (+) Arthritis , Osteoarthritis,    Abdominal   Peds  Hematology   Anesthesia Other Findings Makes noise during sleep  Reproductive/Obstetrics                            Anesthesia Physical Anesthesia Plan  ASA: 3  Anesthesia Plan: General   Post-op Pain Management:    Induction: Intravenous  PONV Risk Score and Plan: Propofol infusion  Airway Management Planned: Nasal Cannula and Natural Airway  Additional Equipment:   Intra-op Plan:   Post-operative Plan:   Informed Consent: I have reviewed the patients History and Physical, chart, labs and discussed the procedure including the risks, benefits and alternatives for the proposed anesthesia with the patient or authorized representative who has indicated his/her understanding and acceptance.      Dental advisory given  Plan Discussed with: CRNA and Surgeon  Anesthesia Plan Comments:        Anesthesia Quick Evaluation

## 2021-04-20 NOTE — Interval H&P Note (Signed)
History and Physical Interval Note:  04/20/2021 9:24 AM Raven Green is a 65 y.o. female with past medical history of diabetes, hypertension, IBS and history of splenic artery aneurysm status post embolization, who presents for evaluation of abdominal pain, bloating and colorectal cancer screening.  Patient reports having some intermittent episodes of pain in the right upper quadrant chronically through the day.  Denies any nausea, vomiting, fever, chills, melena or hematochezia.  Has also some intermittent bloating.  BP (!) 172/91   Pulse 94   Temp 97.9 F (36.6 C) (Oral)   Resp 17   Ht 5' 4.5" (1.638 m)   Wt 102.8 kg   SpO2 98%   BMI 38.31 kg/m   GENERAL: The patient is AO x3, in no acute distress. HEENT: Head is normocephalic and atraumatic. EOMI are intact. Mouth is well hydrated and without lesions. NECK: Supple. No masses LUNGS: Clear to auscultation. No presence of rhonchi/wheezing/rales. Adequate chest expansion HEART: RRR, normal s1 and s2. ABDOMEN: Soft, nontender, no guarding, no peritoneal signs, and nondistended. BS +. No masses. EXTREMITIES: Without any cyanosis, clubbing, rash, lesions or edema. NEUROLOGIC: AOx3, no focal motor deficit. SKIN: no jaundice, no rashes   Raven Green  has presented today for surgery, with the diagnosis of Epigastric Pain and Screening Colonoscopy.  The various methods of treatment have been discussed with the patient and family. After consideration of risks, benefits and other options for treatment, the patient has consented to  Procedure(s) with comments: ESOPHAGOGASTRODUODENOSCOPY (EGD) WITH PROPOFOL (N/A) - 9:15 COLONOSCOPY WITH PROPOFOL (N/A) as a surgical intervention.  The patient's history has been reviewed, patient examined, no change in status, stable for surgery.  I have reviewed the patient's chart and labs.  Questions were answered to the patient's satisfaction.     [name] Mayorga

## 2021-04-20 NOTE — Discharge Instructions (Addendum)
You are being discharged to home.  Resume your previous diet.  We are waiting for your pathology results.  Start Bentyl every 12 h as needed  Your physician has recommended a repeat colonoscopy for surveillance based on pathology results.

## 2021-04-20 NOTE — Op Note (Signed)
[facility] Patient Name: Raven Green Procedure Date: 04/20/2021 9:15 AM MRN: [id number] Date of Birth: 23-Mar-1956 Attending MD: [name] ,  CSN: [id number] Age: 65 Admit Type: Outpatient Procedure:                Upper GI endoscopy Indications:              Abdominal pain in the right upper quadrant,                            Abdominal bloating Providers:                [name], Caprice Kluver, Aram Candela Referring MD:              Medicines:                Monitored Anesthesia Care Complications:            No immediate complications. Estimated Blood Loss:     Estimated blood loss: none. Procedure:                Pre-Anesthesia Assessment:                           - Prior to the procedure, a History and Physical                            was performed, and patient medications, allergies                            and sensitivities were reviewed. The patient's                            tolerance of previous anesthesia was reviewed.                           - The risks and benefits of the procedure and the                            sedation options and risks were discussed with the                            patient. All questions were answered and informed                            consent was obtained.                           - ASA Grade Assessment: III - A patient with severe                            systemic disease.                           After obtaining informed consent, the endoscope was                            passed under direct vision. Throughout the  procedure, the patient's blood pressure, pulse, and                            oxygen saturations were monitored continuously. The                            [id number] scope was introduced through the                            mouth, and advanced to the second part of duodenum.                            The upper GI endoscopy was accomplished without                             difficulty. The patient tolerated the procedure                            well. Scope In: 9:34:43 AM Scope Out: 9:38:51 AM Total Procedure Duration: 0 hours 4 minutes 8 seconds  Findings:      The examined esophagus was normal.      The entire examined stomach was normal. Biopsies were taken with a cold       forceps for Helicobacter pylori testing.      The examined duodenum was normal. Biopsies were taken with a cold       forceps for histology. Impression:               - Normal esophagus.                           - Normal stomach. Biopsied.                           - Normal examined duodenum. Biopsied. Moderate Sedation:      Per Anesthesia Care Recommendation:           - Discharge patient to home (ambulatory).                           - Resume previous diet.                           - Await pathology results.                           - Start Bentyl every 12 h as needed Procedure Code(s):        --- Professional ---                           [phone], Esophagogastroduodenoscopy, flexible,                            transoral; with biopsy, single or multiple Diagnosis Code(s):        --- Professional ---  R10.11, Right upper quadrant pain                           R14.0, Abdominal distension (gaseous) CPT copyright 2019 American Medical Association. All rights reserved. The codes documented in this report are preliminary and upon coder review may  be revised to meet current compliance requirements. [name], MD [name],  04/20/2021 9:42:40 AM This report has been signed electronically. Number of Addenda: 0

## 2021-04-20 NOTE — Anesthesia Postprocedure Evaluation (Signed)
Anesthesia Post Note  Patient: Raven Green  Procedure(s) Performed: ESOPHAGOGASTRODUODENOSCOPY (EGD) WITH PROPOFOL COLONOSCOPY WITH PROPOFOL BIOPSY POLYPECTOMY  Patient location during evaluation: Endoscopy Anesthesia Type: General Level of consciousness: awake and alert and oriented Pain management: pain level controlled Vital Signs Assessment: post-procedure vital signs reviewed and stable Respiratory status: spontaneous breathing and respiratory function stable Cardiovascular status: blood pressure returned to baseline and stable Postop Assessment: no apparent nausea or vomiting Anesthetic complications: no   No notable events documented.   Last Vitals:  Vitals:   04/20/21 0805 04/20/21 1009  BP: (!) 172/91 117/75  Pulse: 94 79  Resp: 17 (!) 21  Temp: 36.6 C 36.8 C  SpO2: 98% 95%    Last Pain:  Vitals:   04/20/21 1009  TempSrc: Oral  PainSc: 0-No pain                 Jaylea Plourde C Venora Kautzman

## 2021-04-20 NOTE — Anesthesia Procedure Notes (Signed)
Date/Time: 04/20/2021 9:27 AM Performed by: Vista Deck, CRNA Pre-anesthesia Checklist: Patient identified, Emergency Drugs available, Suction available, Timeout performed and Patient being monitored Patient Re-evaluated:Patient Re-evaluated prior to induction Oxygen Delivery Method: Non-rebreather mask

## 2021-04-21 LAB — SURGICAL PATHOLOGY

## 2021-04-27 ENCOUNTER — Encounter (INDEPENDENT_AMBULATORY_CARE_PROVIDER_SITE_OTHER): Payer: Self-pay | Admitting: *Deleted

## 2021-04-28 ENCOUNTER — Encounter (HOSPITAL_COMMUNITY): Payer: Self-pay | Admitting: Gastroenterology

## 2021-05-05 ENCOUNTER — Other Ambulatory Visit: Payer: Self-pay

## 2021-05-05 ENCOUNTER — Encounter (INDEPENDENT_AMBULATORY_CARE_PROVIDER_SITE_OTHER): Payer: Self-pay | Admitting: Internal Medicine

## 2021-05-05 ENCOUNTER — Ambulatory Visit (INDEPENDENT_AMBULATORY_CARE_PROVIDER_SITE_OTHER): Payer: BC Managed Care – PPO | Admitting: Internal Medicine

## 2021-05-05 VITALS — BP 172/80 | HR 85 | Temp 98.9°F | Ht 64.5 in | Wt 226.4 lb

## 2021-05-05 DIAGNOSIS — R1011 Right upper quadrant pain: Secondary | ICD-10-CM

## 2021-05-05 DIAGNOSIS — K589 Irritable bowel syndrome without diarrhea: Secondary | ICD-10-CM | POA: Diagnosis not present

## 2021-05-05 DIAGNOSIS — R635 Abnormal weight gain: Secondary | ICD-10-CM

## 2021-05-05 DIAGNOSIS — R140 Abdominal distension (gaseous): Secondary | ICD-10-CM

## 2021-05-05 MED ORDER — HYOSCYAMINE SULFATE 0.125 MG SL SUBL: 0.1250 mg | SUBLINGUAL_TABLET | Freq: Three times a day (TID) | SUBLINGUAL | 1 refills | Status: DC

## 2021-05-05 NOTE — Patient Instructions (Signed)
Levsin/hyoscyamine sublingual 5 to 10 minutes before each meal.  He can stop the medication if he experience any side effects.  If you do not see any relief in your symptoms you can stop this medication after 2 weeks. Physician [place] of the results of blood test and CT.

## 2021-05-05 NOTE — Progress Notes (Signed)
Presenting complaint;  Right upper quadrant abdominal pain and bloating.  Database and subjective:  Patient is 65 year old Caucasian female who was referred for evaluation of persistent right upper quadrant abdominal pain bloating and increased stool frequency.  Her pain started about 2 months ago.  She experienced severe pain in right upper quadrant and right flank associated with nausea on 03/18/2021.Marland Kitchen  She was seen in emergency room.  She was thought to have kidney stone.  Urinalysis, CBC and comprehensive chemistry panel were within normal limits.  Serum lipase was 33.  Abdominal pelvic CT without contrast reveals a fatty liver left renal cyst and punctate calcification along lower pole of left kidney thought to be 1 to 2 mm nonobstructive calculus.  Ultrasound did not reveal evidence of cholelithiasis dilated bile duct or gallbladder wall thickening. She was subsequently seen by Dr. Delphina Cahill and underwent HIDA scan with CCK on 03/31/2021 and it revealed EF of 32%.  Patient recalls she had very mild symptoms towards the end of the study. Patient was then referred to our office and she was seen by Dr. Jenetta Downer on 04/11/2021.  He felt patient had IBS.  He recommended low FODMAP diet and IBgard.  She underwent esophagogastroduodenoscopy and colonoscopy on 04/20/2021. EGD revealed no abnormality.  Gastric biopsy revealed reactive gastropathy with intestinal metaplasia and hyperplastic changes but no dysplasia.  H. pylori stains were negative.  duodenal biopsy was negative for any abnormality to suggest celiac disease.  He recommended EGD in 3 years. Colonoscopy revealed 2 small polyps and one was a tubular adenoma.  He recommended colonoscopy in 7 years. Patient was advised to try dicyclomine for symptoms.  She took few doses.  It caused her to have blurred vision and it did not help with symptoms therefore she discontinued it. She has taking Tylenol every day and still having pain.  Pain is located in the  right upper quadrant.  She states when this pain started it would radiate to the side and back but not lately.  She has daily pain.  1 day she felt needles and jobs while walking and sitting.  And other day she noted severe pain while riding in a car but it did not last for more than few minutes.  At other times she experiences dull aching pain as well as feeling of rawness which can last all day.  She has not noted any change in her pain on changing posture or turning in bed.  She has occasional nausea but no vomiting.  She feels bloated every day. He says her appetite is fair.  She watches calorie intake and does not understand why she has gained 15 pounds this year.  She remains with frequent bowel movements.  Ranges 2 to 6/day.  On most days she has 3.  Stools can be formed soft and rarely loose.  She denies nocturnal bowel movements melena or rectal bleeding. She rarely experiences heartburn. She has a history of migraines but has not had any episodes lately.  She states sitting in a dark room and closing her eyes for few minutes alleviates her headache. Review of the systems also positive for chronic right shoulder pain which she has had for about a year.  She has not had any studies.  She will touch base with Dr. Nevada Crane about ongoing pain. She is not able to do much physical activity on account of severe right knee arthritis.   Current Medications: Outpatient Encounter Medications as of 05/05/2021  Medication Sig  acetaminophen (TYLENOL) 500 MG tablet Take 500 mg by mouth every 6 (six) hours as needed.   chlorthalidone (HYGROTON) 25 MG tablet Take 12.5 mg by mouth in the morning.   linaGLIPtin-metFORMIN HCl ER (JENTADUETO XR) 12-998 MG TB24 Take 1 tablet by mouth daily at 6 PM.   metoprolol succinate (TOPROL-XL) 25 MG 24 hr tablet Take 12.5 mg by mouth in the morning.   TOUJEO MAX SOLOSTAR 300 UNIT/ML Solostar Pen Inject 44 Units into the skin daily at 10 pm.   [DISCONTINUED] dicyclomine (BENTYL)  10 MG capsule Take 1 capsule (10 mg total) by mouth every 12 (twelve) hours as needed (abdominal pain). (Patient not taking: Reported on 05/05/2021)   No facility-administered encounter medications on file as of 05/05/2021.   Past Medical History:  Diagnosis Date   Arthritis    Diabetes mellitus without complication ([place]) she has been diabetic for 4 years.  A1c last month was 8.    gestational   Endometriosis    History of migraine headaches    Hypertension 8 years duration.    IBS (irritable bowel syndrome)        Obesity.       Right knee arthritis. Past Surgical History:  Procedure Laterality Date   Right L5-L1 discectomy and foraminotomy in November 2005     Neck surgery for disc disease more than 10 years ago.        BREAST BIOPSY August 2006 Left    Fibroadenoma   COLONOSCOPY WITH PROPOFOL N/A 04/20/2021   Procedure: COLONOSCOPY WITH PROPOFOL;  Surgeon: Harvel Quale, MD;  [facility];  Service: Gastroenterology;  Laterality: N/A;   EMBOLIZATION  08/13/2013   Procedure: EMBOLIZATION;  Surgeon: Conrad Fort White, MD;  Location: [facility] CATH LAB;  Service: Cardiovascular;;  splenic artery aneurysm   ESOPHAGOGASTRODUODENOSCOPY (EGD) WITH PROPOFOL N/A 04/20/2021   Procedure: ESOPHAGOGASTRODUODENOSCOPY (EGD) WITH PROPOFOL;  Surgeon: Harvel Quale, MD;  [facility];  Service: Gastroenterology;  Laterality: N/A;  9:15   POLYPECTOMY  04/20/2021   Procedure: POLYPECTOMY;  Surgeon: Harvel Quale, MD;  [facility];  Service: Gastroenterology;;   TONSILLECTOMY AND ADENOIDECTOMY as a child.     VISCERAL ANGIOGRAM N/A 08/13/2013   Procedure: VISCERAL ANGIOGRAM;  Surgeon: Conrad Urbank, MD;  [facility] LAB;  Service: Cardiovascular;  Laterality: N/A;     Objective: Blood pressure (!) 172/80, pulse 85, temperature 98.9 F (37.2 C), temperature source Oral, height 5' 4.5" (1.638 m), weight 226 lb 6.4 oz (102.7 kg). Patient is  alert and in no acute distress. He appears to be frustrated and worried because of her symptoms and no explanation. Conjunctiva is pink. Sclera is nonicteric Oropharyngeal mucosa is normal. No neck masses or thyromegaly noted. Cardiac exam with regular rhythm normal S1 and S2. No murmur or gallop noted. Lungs are clear to auscultation. Abdomen is full.  Bowel sounds are normal.  No bruit noted.  On tactile sensation she has hyperalgesia right upper quadrant lateral to the midclavicular line.  On palpation abdomen is soft.  He has mild tenderness below the right costal margin on deep palpation.  No tenderness noted to superficial palpation.  No organomegaly or masses. No LE edema or clubbing noted.  Labs/studies Results:   CBC Latest Ref Rng & Units 03/18/2021 08/14/2013 08/13/2013  WBC 4.0 - 10.5 K/uL 9.6 10.5 9.0  Hemoglobin 12.0 - 15.0 g/dL 12.7 10.4(L) 11.7(L)  Hematocrit 36.0 - 46.0 % 39.2 32.0(L) 35.6(L)  Platelets 150 - 400 K/uL 265 182 198    CMP Latest Ref Rng & Units 03/18/2021 08/14/2013 08/13/2013  Glucose 70 - 99 mg/dL 182(H) - -  BUN 8 - 23 mg/dL 15 - -  Creatinine 0.44 - 1.00 mg/dL 0.87 - 0.66  Sodium 135 - 145 mmol/L 137 - -  Potassium 3.5 - 5.1 mmol/L 3.4(L) - -  Chloride 98 - 111 mmol/L 101 - -  CO2 22 - 32 mmol/L 27 - -  Calcium 8.9 - 10.3 mg/dL 9.1 - -  Total Protein 6.5 - 8.1 g/dL 6.9 5.8(L) -  Total Bilirubin 0.3 - 1.2 mg/dL 0.6 0.4 -  Alkaline Phos 38 - 126 U/L 60 63 -  AST 15 - 41 U/L 19 14 -  ALT 0 - 44 U/L 20 12 -    Hepatic Function Latest Ref Rng & Units 03/18/2021 08/14/2013  Total Protein 6.5 - 8.1 g/dL 6.9 5.8(L)  Albumin 3.5 - 5.0 g/dL 3.7 2.8(L)  AST 15 - 41 U/L 19 14  ALT 0 - 44 U/L 20 12  Alk Phosphatase 38 - 126 U/L 60 63  Total Bilirubin 0.3 - 1.2 mg/dL 0.6 0.4  Bilirubin, Direct 0.0 - 0.3 mg/dL - <0.1    Recent lab data reviewed. Coronary CT from July 2022 reviewed.  Findings as above. Ultrasound from July 2022 reviewed findings as  above. HIDA scan.  Gallbladder visualized within 20 minutes.  EF 32% which is borderline.   Assessment:  #1.  Right upper quadrant abdominal pain.  I suspect pain is multifactorial.  Some of this pain could be due to IBS.  She did not respond to dietary changes.  Notably she did not respond to low-dose dicyclomine she also developed blurred vision.   Her symptoms are not typical of gallbladder disease.  She has hyperalgesia in right upper quadrant.  Therefore some of her pain could be neuropathic pain either related to diabetes mellitus or nerve root impingement.  If no other cause of her pain is determined she would benefit from a lower thoracic MRI. First need to rule out occult pancreatic process numerous symptoms.  #2.  Irritable bowel syndrome.  She did not tolerate low-dose dicyclomine.  We will try her on sublingual hyoscyamine.  #3.  Bloating.  She does not have any alarm symptoms.  Bloating is possible parcel of IBS.  Recent duodenal biopsy rule out celiac disease.  She may be swallowing more air with ongoing symptoms.  #4.  Weight gain.  She has gained 15 pounds this year.  This may well be due to insulin.  We will screen for hypothyroidism.  #5.  She has fatty liver.  Transaminases are normal.  Therefore likelihood of progressive disease is very low.  Improve glycemic control would help.   Plan:  She can continue taking Tylenol up to 2 g/day in divided dose as long as it is working. Hyoscyamine sublingual 1 tablet 5 to 10 minutes before each meal.  She can stop this medication if she feels it is not helping. Abdominal pelvic CT with contrast. LFTs and TSH. Further recommendations to follow.

## 2021-05-06 LAB — HEPATIC FUNCTION PANEL
AG Ratio: 1.6 (calc) (ref 1.0–2.5)
ALT: 15 U/L (ref 6–29)
AST: 16 U/L (ref 10–35)
Albumin: 4.1 g/dL (ref 3.6–5.1)
Alkaline phosphatase (APISO): 61 U/L (ref 37–153)
Bilirubin, Direct: 0.1 mg/dL (ref 0.0–0.2)
Globulin: 2.6 g/dL (calc) (ref 1.9–3.7)
Indirect Bilirubin: 0.3 mg/dL (calc) (ref 0.2–1.2)
Total Bilirubin: 0.4 mg/dL (ref 0.2–1.2)
Total Protein: 6.7 g/dL (ref 6.1–8.1)

## 2021-05-06 LAB — TSH: TSH: 3.43 mIU/L (ref 0.40–4.50)

## 2021-05-11 ENCOUNTER — Encounter (HOSPITAL_COMMUNITY): Payer: Self-pay | Admitting: Radiology

## 2021-05-11 ENCOUNTER — Other Ambulatory Visit: Payer: Self-pay

## 2021-05-11 ENCOUNTER — Ambulatory Visit (HOSPITAL_COMMUNITY)
Admission: RE | Admit: 2021-05-11 | Discharge: 2021-05-11 | Disposition: A | Discharge status: Home / Self Care | Payer: BC Managed Care – PPO | Source: Ambulatory Visit | Attending: Internal Medicine | Admitting: Internal Medicine

## 2021-05-11 DIAGNOSIS — R1031 Right lower quadrant pain: Secondary | ICD-10-CM | POA: Diagnosis not present

## 2021-05-11 DIAGNOSIS — K760 Fatty (change of) liver, not elsewhere classified: Secondary | ICD-10-CM | POA: Diagnosis not present

## 2021-05-11 DIAGNOSIS — R1011 Right upper quadrant pain: Secondary | ICD-10-CM | POA: Diagnosis not present

## 2021-05-11 DIAGNOSIS — R140 Abdominal distension (gaseous): Secondary | ICD-10-CM | POA: Insufficient documentation

## 2021-05-11 LAB — POCT I-STAT CREATININE: Creatinine, Ser: 0.7 mg/dL (ref 0.44–1.00)

## 2021-05-11 MED ORDER — IOHEXOL 350 MG/ML SOLN
100.0000 mL | Freq: Once | INTRAVENOUS | Status: AC | PRN
  Administered 2021-05-11: 80 mL via INTRAVENOUS

## 2021-05-12 ENCOUNTER — Other Ambulatory Visit (INDEPENDENT_AMBULATORY_CARE_PROVIDER_SITE_OTHER): Payer: Self-pay | Admitting: Internal Medicine

## 2021-05-12 MED ORDER — HYDROCODONE-ACETAMINOPHEN 5-325 MG PO TABS: 1.0000 | ORAL_TABLET | Freq: Three times a day (TID) | ORAL | 0 refills | Status: DC | PRN

## 2021-06-03 ENCOUNTER — Encounter (INDEPENDENT_AMBULATORY_CARE_PROVIDER_SITE_OTHER): Payer: Self-pay

## 2021-06-23 ENCOUNTER — Ambulatory Visit (INDEPENDENT_AMBULATORY_CARE_PROVIDER_SITE_OTHER): Payer: BC Managed Care – PPO | Admitting: Internal Medicine

## 2021-07-01 DIAGNOSIS — E119 Type 2 diabetes mellitus without complications: Secondary | ICD-10-CM | POA: Diagnosis not present

## 2021-07-01 DIAGNOSIS — I10 Essential (primary) hypertension: Secondary | ICD-10-CM | POA: Diagnosis not present

## 2021-07-07 ENCOUNTER — Other Ambulatory Visit (HOSPITAL_COMMUNITY): Payer: Self-pay | Admitting: Family Medicine

## 2021-07-07 DIAGNOSIS — M810 Age-related osteoporosis without current pathological fracture: Secondary | ICD-10-CM

## 2021-07-07 DIAGNOSIS — Z1239 Encounter for other screening for malignant neoplasm of breast: Secondary | ICD-10-CM | POA: Diagnosis not present

## 2021-07-07 DIAGNOSIS — I10 Essential (primary) hypertension: Secondary | ICD-10-CM | POA: Diagnosis not present

## 2021-07-07 DIAGNOSIS — E1169 Type 2 diabetes mellitus with other specified complication: Secondary | ICD-10-CM | POA: Diagnosis not present

## 2021-07-07 DIAGNOSIS — Z13820 Encounter for screening for osteoporosis: Secondary | ICD-10-CM | POA: Diagnosis not present

## 2021-07-13 ENCOUNTER — Other Ambulatory Visit: Payer: Self-pay | Admitting: Family Medicine

## 2021-07-13 DIAGNOSIS — Z1231 Encounter for screening mammogram for malignant neoplasm of breast: Secondary | ICD-10-CM

## 2021-07-14 ENCOUNTER — Ambulatory Visit (INDEPENDENT_AMBULATORY_CARE_PROVIDER_SITE_OTHER): Payer: BC Managed Care – PPO | Admitting: Internal Medicine

## 2021-07-15 ENCOUNTER — Ambulatory Visit (INDEPENDENT_AMBULATORY_CARE_PROVIDER_SITE_OTHER): Payer: BC Managed Care – PPO | Admitting: Internal Medicine

## 2021-07-15 ENCOUNTER — Encounter (INDEPENDENT_AMBULATORY_CARE_PROVIDER_SITE_OTHER): Payer: Self-pay | Admitting: Internal Medicine

## 2021-07-15 DIAGNOSIS — R1011 Right upper quadrant pain: Secondary | ICD-10-CM | POA: Diagnosis not present

## 2021-07-15 NOTE — Progress Notes (Signed)
Virtual Visit via Telephone Note  I connected with Raven Green on 07/15/21 at  9:28 AM EST by telephone and verified that I am speaking with the correct person using two identifiers.  Location: Patient: Raven Becton, Raven Green Provider: office   I discussed the limitations, risks, security and privacy concerns of performing an evaluation and management service by telephone and the availability of in person appointments. I also discussed with the patient that there may be a patient responsible charge related to this service. The patient expressed understanding and agreed to proceed.   History of Present Illness:  Patient is 65 year old Caucasian female was evaluated by Dr. Jenetta Downer in August 2022 for abdominal pain and bloating.  She was also having intermittent diarrhea.  She did not tolerate dicyclomine.  S he recommended low FODMAP diet as well as IBgard.  She underwent EGD and colonoscopy.  Gastric biopsy was negative for H. pylori but showed intestinal dysplasia.  And duodenal biopsy did not show any changes of celiac disease.  He recommended a follow-up exam in 3 years.  Colonoscopy revealed 2 small polyps in 1 polyp was tubular adenoma and he recommended follow-up exam in 7 years. Patient returns for follow-up visit on 05/05/2021.  She was complaining of right upper quadrant abdominal pain and bloating.  LFTs are normal.  CT revealed hepatic steatosis and hepatomegaly but no other abnormality to account for her pain.  I felt the pain was predominantly musculoskeletal or neuropathic in some of the pain could be due to IBS.  She is advised to monitor symptoms and use Tylenol and hyoscyamine.  Patient states her pain resolved about 1 month ago.  She is maintaining a good posture.  She walks a mile a day.  She has not [facility] with her daughter who is having pregnancy issues.  She states she has lost 9 pounds.  Her bowels move daily.  She does not feel bloated anymore.  She denies melena or  rectal bleeding.    Current Outpatient Medications:    chlorthalidone (HYGROTON) 25 MG tablet, Take 12.5 mg by mouth in the morning., Disp: , Rfl:    linaGLIPtin-metFORMIN HCl ER (JENTADUETO XR) 12-998 MG TB24, Take 1 tablet by mouth daily at 6 PM., Disp: , Rfl:    metoprolol succinate (TOPROL-XL) 25 MG 24 hr tablet, Take 12.5 mg by mouth in the morning., Disp: , Rfl:    TOUJEO MAX SOLOSTAR 300 UNIT/ML Solostar Pen, Inject 44 Units into the skin daily at 10 pm., Disp: , Rfl:     Observations/Objective:  She weighed 226 pounds on 05/05/2021 Weight now 217 pounds..   Assessment and Plan:  #1.  Right upper quadrant abdominal pain has resolved.  She has not required hyoscyamine or Tylenol in the last 1 month.  Patient predominately musculoskeletal but she also has element of IBS. Patient will call if symptoms relapse.  #2.  Gastric biopsy revealed intestinal metaplasia.  Repeat EGD August 2025.  #3.  Colonoscopy in August 2022 revealed single small tubular adenoma.  Next colonoscopy due in August 2029.   Follow Up Instructions:  Patient will call if pain recurs. Office visit on as-needed basis. I discussed the assessment and treatment plan with the patient. The patient was provided an opportunity to ask questions and all were answered. The patient agreed with the plan and demonstrated an understanding of the instructions.   The patient was advised to call back or seek an in-person evaluation if the symptoms worsen or if  the condition fails to improve as anticipated.  I provided 8 minutes of non-face-to-face time during this encounter.   Hildred Laser, Raven Green

## 2021-07-18 ENCOUNTER — Other Ambulatory Visit: Payer: Self-pay

## 2021-07-28 DIAGNOSIS — Z6836 Body mass index (BMI) 36.0-36.9, adult: Secondary | ICD-10-CM | POA: Diagnosis not present

## 2021-07-28 DIAGNOSIS — Z124 Encounter for screening for malignant neoplasm of cervix: Secondary | ICD-10-CM | POA: Diagnosis not present

## 2021-07-28 DIAGNOSIS — Z01419 Encounter for gynecological examination (general) (routine) without abnormal findings: Secondary | ICD-10-CM | POA: Diagnosis not present

## 2021-07-29 ENCOUNTER — Other Ambulatory Visit: Payer: Self-pay

## 2021-07-29 ENCOUNTER — Ambulatory Visit (HOSPITAL_COMMUNITY)
Admission: RE | Admit: 2021-07-29 | Discharge: 2021-07-29 | Disposition: A | Discharge status: Home / Self Care | Payer: Medicare Other | Source: Ambulatory Visit | Attending: Family Medicine | Admitting: Family Medicine

## 2021-07-29 DIAGNOSIS — Z780 Asymptomatic menopausal state: Secondary | ICD-10-CM | POA: Diagnosis not present

## 2021-07-29 DIAGNOSIS — M810 Age-related osteoporosis without current pathological fracture: Secondary | ICD-10-CM | POA: Diagnosis not present

## 2021-08-24 ENCOUNTER — Ambulatory Visit
Admission: EM | Admit: 2021-08-24 | Discharge: 2021-08-24 | Disposition: A | Discharge status: Home / Self Care | Payer: Medicare Other | Attending: Urgent Care | Admitting: Urgent Care

## 2021-08-24 ENCOUNTER — Ambulatory Visit (INDEPENDENT_AMBULATORY_CARE_PROVIDER_SITE_OTHER): Payer: Medicare Other

## 2021-08-24 ENCOUNTER — Other Ambulatory Visit: Payer: Self-pay

## 2021-08-24 DIAGNOSIS — R0789 Other chest pain: Secondary | ICD-10-CM | POA: Diagnosis not present

## 2021-08-24 DIAGNOSIS — R059 Cough, unspecified: Secondary | ICD-10-CM | POA: Diagnosis not present

## 2021-08-24 DIAGNOSIS — J018 Other acute sinusitis: Secondary | ICD-10-CM

## 2021-08-24 DIAGNOSIS — J3489 Other specified disorders of nose and nasal sinuses: Secondary | ICD-10-CM

## 2021-08-24 DIAGNOSIS — R052 Subacute cough: Secondary | ICD-10-CM

## 2021-08-24 DIAGNOSIS — J3089 Other allergic rhinitis: Secondary | ICD-10-CM

## 2021-08-24 DIAGNOSIS — E119 Type 2 diabetes mellitus without complications: Secondary | ICD-10-CM

## 2021-08-24 MED ORDER — PROMETHAZINE-DM 6.25-15 MG/5ML PO SYRP: 5.0000 mL | ORAL_SOLUTION | Freq: Every evening | ORAL | 0 refills | Status: DC | PRN

## 2021-08-24 MED ORDER — AMOXICILLIN 875 MG PO TABS: 875.0000 mg | ORAL_TABLET | Freq: Two times a day (BID) | ORAL | 0 refills | Status: DC

## 2021-08-24 MED ORDER — PREDNISONE 10 MG PO TABS: 30.0000 mg | ORAL_TABLET | Freq: Every day | ORAL | 0 refills | Status: DC

## 2021-08-24 MED ORDER — BENZONATATE 100 MG PO CAPS: 100.0000 mg | ORAL_CAPSULE | Freq: Three times a day (TID) | ORAL | 0 refills | Status: DC | PRN

## 2021-08-24 NOTE — Discharge Instructions (Signed)

## 2021-08-24 NOTE — ED Triage Notes (Signed)
Pt reports cough and nasal congestion x 1 week; crusty eyes since this morning.

## 2021-08-24 NOTE — ED Provider Notes (Signed)
[place]   MRN: [id number] DOB: 12-Aug-1956  Subjective:   Raven Green is a 65 y.o. female presenting for 1 week history of persistent and worsening sinus congestion, coughing, chest discomfort.  Woke up today and felt drainage from both of her eyes.  Has had bilateral ear discomfort and throat discomfort as well.  No history of asthma, no smoking. Had a COVID test and was negative.  Has had pneumonia 3 times in her adulthood, wants to make sure she does not have this.  She does admit that her symptoms started actually from having a lot of exposure to flowers that she is not used to.  This happened for about a week and she progressively worsening.  She was visiting family when this happened.  Then when she came home her husband got her more flowers and she felt like this worsened her symptoms.  Has been using Zyrtec for her allergic rhinitis.  No current facility-administered medications for this encounter.  Current Outpatient Medications:    chlorthalidone (HYGROTON) 25 MG tablet, Take 12.5 mg by mouth in the morning., Disp: , Rfl:    linaGLIPtin-metFORMIN HCl ER (JENTADUETO XR) 12-998 MG TB24, Take 1 tablet by mouth daily at 6 PM., Disp: , Rfl:    metoprolol succinate (TOPROL-XL) 25 MG 24 hr tablet, Take 12.5 mg by mouth in the morning., Disp: , Rfl:    TOUJEO MAX SOLOSTAR 300 UNIT/ML Solostar Pen, Inject 44 Units into the skin daily at 10 pm., Disp: , Rfl:    Allergies  Allergen Reactions   Codeine Other (See Comments) and Nausea Only    "Felt funny" Other reaction(s): Dizziness   Sulfa Antibiotics Rash    Past Medical History:  Diagnosis Date   Arthritis    Diabetes mellitus without complication ([place])    gestational   Endometriosis    History of migraine headaches    Hypertension    IBS (irritable bowel syndrome)      Past Surgical History:  Procedure Laterality Date   BACK SURGERY     BIOPSY  04/20/2021   Procedure: BIOPSY;  Surgeon: Montez Morita, Quillian Quince, MD;  [facility];  Service: Gastroenterology;;  small bowel, gastric   BREAST BIOPSY Left    Fibroadenoma   COLONOSCOPY WITH PROPOFOL N/A 04/20/2021   Procedure: COLONOSCOPY WITH PROPOFOL;  Surgeon: Harvel Quale, MD;  [facility];  Service: Gastroenterology;  Laterality: N/A;   EMBOLIZATION  08/13/2013   Procedure: EMBOLIZATION;  Surgeon: Conrad Blue [place], MD;  Location: [facility] CATH LAB;  Service: Cardiovascular;;  splenic artery aneurysm   ESOPHAGOGASTRODUODENOSCOPY (EGD) WITH PROPOFOL N/A 04/20/2021   Procedure: ESOPHAGOGASTRODUODENOSCOPY (EGD) WITH PROPOFOL;  Surgeon: Harvel Quale, MD;  [facility];  Service: Gastroenterology;  Laterality: N/A;  9:15   POLYPECTOMY  04/20/2021   Procedure: POLYPECTOMY;  Surgeon: Harvel Quale, MD;  [facility];  Service: Gastroenterology;;   TONSILLECTOMY AND ADENOIDECTOMY     VISCERAL ANGIOGRAM N/A 08/13/2013   Procedure: VISCERAL ANGIOGRAM;  Surgeon: Conrad Dougherty, MD;  Location: [facility] CATH LAB;  Service: Cardiovascular;  Laterality: N/A;    Family History  Problem Relation Age of Onset   Deep vein thrombosis Mother    Hyperlipidemia Mother    Hypertension Mother    Varicose Veins Mother    Other Mother        lupus anticoagulation   Diabetes Father    Hypertension Father    Peripheral vascular disease  Father    Cancer Father        bladder   Diabetes Sister    Cancer Maternal Grandmother        breast   Breast cancer Maternal Grandmother    Gallstones Maternal Grandmother        gallstone stuck in liver duct, passed away while in  hospital   Cancer Maternal Grandfather        bladder    Social History   Tobacco Use   Smoking status: Never   Smokeless tobacco: Never  Vaping Use   Vaping Use: Never used  Substance Use Topics   Alcohol use: Not Currently   Drug use: No    ROS   Objective:   Vitals: BP (!) 149/87 (BP Location: Right Arm)     Pulse 89    Temp 98.8 F (37.1 C) (Oral)    Resp 18    SpO2 95%   Physical Exam Constitutional:      General: She is not in acute distress.    Appearance: Normal appearance. She is well-developed. She is not ill-appearing, toxic-appearing or diaphoretic.  HENT:     Head: Normocephalic and atraumatic.     Right Ear: Tympanic membrane, ear canal and external ear normal. No drainage or tenderness. No middle ear effusion. There is no impacted cerumen. Tympanic membrane is not erythematous.     Left Ear: Tympanic membrane, ear canal and external ear normal. No drainage or tenderness.  No middle ear effusion. There is no impacted cerumen. Tympanic membrane is not erythematous.     Nose: Congestion present. No rhinorrhea.     Mouth/Throat:     Mouth: Mucous membranes are moist. No oral lesions.     Pharynx: No pharyngeal swelling, oropharyngeal exudate, posterior oropharyngeal erythema or uvula swelling.     Tonsils: No tonsillar exudate or tonsillar abscesses.     Comments: Post-nasal drainage overlying pharynx.  Eyes:     General: No scleral icterus.       Right eye: No discharge.        Left eye: No discharge.     Extraocular Movements: Extraocular movements intact.     Right eye: Normal extraocular motion.     Left eye: Normal extraocular motion.     Conjunctiva/sclera: Conjunctivae normal.     Pupils: Pupils are equal, round, and reactive to light.  Cardiovascular:     Rate and Rhythm: Normal rate and regular rhythm.     Pulses: Normal pulses.     Heart sounds: Normal heart sounds. No murmur heard.   No friction rub. No gallop.  Pulmonary:     Effort: Pulmonary effort is normal. No respiratory distress.     Breath sounds: Normal breath sounds. No stridor. No wheezing, rhonchi or rales.  Musculoskeletal:     Cervical back: Normal range of motion and neck supple.  Lymphadenopathy:     Cervical: No cervical adenopathy.  Skin:    General: Skin is warm and dry.     Findings: No  rash.  Neurological:     General: No focal deficit present.     Mental Status: She is alert and oriented to person, place, and time.  Psychiatric:        Mood and Affect: Mood normal.        Behavior: Behavior normal.        Thought Content: Thought content normal.        Judgment: Judgment normal.   DG Chest 2  View  Result Date: 08/24/2021 CLINICAL DATA:  Cough, chest tightness. EXAM: CHEST - 2 VIEW COMPARISON:  11/01/2015 FINDINGS: Both lungs are clear. Heart and mediastinum are within normal limits. Surgical plate in the lower cervical spine. Again noted are embolization coils in the upper abdomen. Degenerative changes in thoracic spine. No pleural effusions. IMPRESSION: No active cardiopulmonary disease. Electronically Signed   By: Markus Daft M.D.   On: 08/24/2021 12:32    Assessment and Plan :   PDMP not reviewed this encounter.  1. Acute non-recurrent sinusitis of other sinus   2. Allergic rhinitis due to other allergic trigger, unspecified seasonality   3. Stuffy and runny nose   4. Subacute cough   5. Type 2 diabetes mellitus treated without insulin ([place])    Will start empiric treatment for sinusitis with amoxicillin.  Recommended supportive care otherwise including the use of oral antihistamine, prednisone course due to her underlying allergic rhinitis. Counseled patient on potential for adverse effects with medications prescribed/recommended today, ER and return-to-clinic precautions discussed, patient verbalized understanding.     Jaynee Eagles, PA-C 08/24/21 1241

## 2021-10-19 DIAGNOSIS — E1169 Type 2 diabetes mellitus with other specified complication: Secondary | ICD-10-CM | POA: Diagnosis not present

## 2021-10-21 DIAGNOSIS — I10 Essential (primary) hypertension: Secondary | ICD-10-CM | POA: Diagnosis not present

## 2021-10-21 DIAGNOSIS — E1169 Type 2 diabetes mellitus with other specified complication: Secondary | ICD-10-CM | POA: Diagnosis not present

## 2021-10-21 DIAGNOSIS — Z5320 Procedure and treatment not carried out because of patient's decision for unspecified reasons: Secondary | ICD-10-CM | POA: Diagnosis not present

## 2021-11-04 ENCOUNTER — Other Ambulatory Visit: Payer: Self-pay | Admitting: Obstetrics and Gynecology

## 2021-11-04 DIAGNOSIS — Z1231 Encounter for screening mammogram for malignant neoplasm of breast: Secondary | ICD-10-CM

## 2021-11-14 ENCOUNTER — Ambulatory Visit
Admission: RE | Admit: 2021-11-14 | Discharge: 2021-11-14 | Disposition: A | Discharge status: Home / Self Care | Payer: Medicare Other | Source: Ambulatory Visit | Attending: Obstetrics and Gynecology | Admitting: Obstetrics and Gynecology

## 2021-11-14 DIAGNOSIS — Z1231 Encounter for screening mammogram for malignant neoplasm of breast: Secondary | ICD-10-CM

## 2021-11-17 DIAGNOSIS — N840 Polyp of corpus uteri: Secondary | ICD-10-CM | POA: Diagnosis not present

## 2021-11-17 DIAGNOSIS — N841 Polyp of cervix uteri: Secondary | ICD-10-CM | POA: Diagnosis not present

## 2021-11-22 DIAGNOSIS — L239 Allergic contact dermatitis, unspecified cause: Secondary | ICD-10-CM | POA: Diagnosis not present

## 2022-02-10 DIAGNOSIS — E119 Type 2 diabetes mellitus without complications: Secondary | ICD-10-CM | POA: Diagnosis not present

## 2022-02-10 DIAGNOSIS — I10 Essential (primary) hypertension: Secondary | ICD-10-CM | POA: Diagnosis not present

## 2022-02-17 DIAGNOSIS — Z5320 Procedure and treatment not carried out because of patient's decision for unspecified reasons: Secondary | ICD-10-CM | POA: Diagnosis not present

## 2022-02-17 DIAGNOSIS — E1169 Type 2 diabetes mellitus with other specified complication: Secondary | ICD-10-CM | POA: Diagnosis not present

## 2022-02-17 DIAGNOSIS — I10 Essential (primary) hypertension: Secondary | ICD-10-CM | POA: Diagnosis not present

## 2022-05-12 DIAGNOSIS — K219 Gastro-esophageal reflux disease without esophagitis: Secondary | ICD-10-CM | POA: Diagnosis not present

## 2022-05-12 DIAGNOSIS — R059 Cough, unspecified: Secondary | ICD-10-CM | POA: Diagnosis not present

## 2022-05-24 DIAGNOSIS — E1169 Type 2 diabetes mellitus with other specified complication: Secondary | ICD-10-CM | POA: Diagnosis not present

## 2022-05-24 DIAGNOSIS — I10 Essential (primary) hypertension: Secondary | ICD-10-CM | POA: Diagnosis not present

## 2022-06-20 DIAGNOSIS — Z5320 Procedure and treatment not carried out because of patient's decision for unspecified reasons: Secondary | ICD-10-CM | POA: Diagnosis not present

## 2022-06-20 DIAGNOSIS — E1169 Type 2 diabetes mellitus with other specified complication: Secondary | ICD-10-CM | POA: Diagnosis not present

## 2022-06-20 DIAGNOSIS — I10 Essential (primary) hypertension: Secondary | ICD-10-CM | POA: Diagnosis not present

## 2022-09-13 ENCOUNTER — Emergency Department (HOSPITAL_COMMUNITY)
Admission: EM | Admit: 2022-09-13 | Discharge: 2022-09-13 | Disposition: A | Discharge status: Home / Self Care | Payer: Medicare Other | Attending: Emergency Medicine | Admitting: Emergency Medicine

## 2022-09-13 ENCOUNTER — Other Ambulatory Visit: Payer: Self-pay

## 2022-09-13 ENCOUNTER — Emergency Department (HOSPITAL_COMMUNITY): Payer: Medicare Other

## 2022-09-13 ENCOUNTER — Encounter (HOSPITAL_COMMUNITY): Payer: Self-pay

## 2022-09-13 DIAGNOSIS — Z79899 Other long term (current) drug therapy: Secondary | ICD-10-CM | POA: Diagnosis not present

## 2022-09-13 DIAGNOSIS — I10 Essential (primary) hypertension: Secondary | ICD-10-CM | POA: Diagnosis not present

## 2022-09-13 DIAGNOSIS — J90 Pleural effusion, not elsewhere classified: Secondary | ICD-10-CM | POA: Diagnosis not present

## 2022-09-13 DIAGNOSIS — R109 Unspecified abdominal pain: Secondary | ICD-10-CM | POA: Diagnosis not present

## 2022-09-13 DIAGNOSIS — I509 Heart failure, unspecified: Secondary | ICD-10-CM

## 2022-09-13 DIAGNOSIS — I110 Hypertensive heart disease with heart failure: Secondary | ICD-10-CM | POA: Insufficient documentation

## 2022-09-13 DIAGNOSIS — N281 Cyst of kidney, acquired: Secondary | ICD-10-CM | POA: Diagnosis not present

## 2022-09-13 DIAGNOSIS — R0602 Shortness of breath: Secondary | ICD-10-CM | POA: Diagnosis not present

## 2022-09-13 DIAGNOSIS — J811 Chronic pulmonary edema: Secondary | ICD-10-CM | POA: Diagnosis not present

## 2022-09-13 DIAGNOSIS — R079 Chest pain, unspecified: Secondary | ICD-10-CM | POA: Diagnosis not present

## 2022-09-13 DIAGNOSIS — Z7984 Long term (current) use of oral hypoglycemic drugs: Secondary | ICD-10-CM | POA: Insufficient documentation

## 2022-09-13 DIAGNOSIS — R0789 Other chest pain: Secondary | ICD-10-CM | POA: Diagnosis not present

## 2022-09-13 DIAGNOSIS — R0600 Dyspnea, unspecified: Secondary | ICD-10-CM

## 2022-09-13 DIAGNOSIS — I700 Atherosclerosis of aorta: Secondary | ICD-10-CM | POA: Diagnosis not present

## 2022-09-13 LAB — BASIC METABOLIC PANEL
Anion gap: 10 (ref 5–15)
BUN: 23 mg/dL (ref 8–23)
CO2: 26 mmol/L (ref 22–32)
Calcium: 8.6 mg/dL — ABNORMAL LOW (ref 8.9–10.3)
Chloride: 101 mmol/L (ref 98–111)
Creatinine, Ser: 1.1 mg/dL — ABNORMAL HIGH (ref 0.44–1.00)
GFR, Estimated: 55 mL/min — ABNORMAL LOW (ref >60)
Glucose, Bld: 171 mg/dL — ABNORMAL HIGH (ref 70–99)
Potassium: 3.1 mmol/L — ABNORMAL LOW (ref 3.5–5.1)
Sodium: 137 mmol/L (ref 135–145)

## 2022-09-13 LAB — CBC
HCT: 36.9 % (ref 36.0–46.0)
Hemoglobin: 11.6 g/dL — ABNORMAL LOW (ref 12.0–15.0)
MCH: 26.9 pg (ref 26.0–34.0)
MCHC: 31.4 g/dL (ref 30.0–36.0)
MCV: 85.6 fL (ref 80.0–100.0)
Platelets: 263 10*3/uL (ref 150–400)
RBC: 4.31 MIL/uL (ref 3.87–5.11)
RDW: 15 % (ref 11.5–15.5)
WBC: 9.1 10*3/uL (ref 4.0–10.5)
nRBC: 0 % (ref 0.0–0.2)

## 2022-09-13 LAB — HEPATIC FUNCTION PANEL
ALT: 30 U/L (ref 0–44)
AST: 31 U/L (ref 15–41)
Albumin: 3.6 g/dL (ref 3.5–5.0)
Alkaline Phosphatase: 57 U/L (ref 38–126)
Bilirubin, Direct: 0.1 mg/dL (ref 0.0–0.2)
Indirect Bilirubin: 0.7 mg/dL (ref 0.3–0.9)
Total Bilirubin: 0.8 mg/dL (ref 0.3–1.2)
Total Protein: 6.8 g/dL (ref 6.5–8.1)

## 2022-09-13 LAB — TROPONIN I (HIGH SENSITIVITY)
Troponin I (High Sensitivity): 19 ng/L — ABNORMAL HIGH (ref <18)
Troponin I (High Sensitivity): 21 ng/L — ABNORMAL HIGH (ref <18)

## 2022-09-13 LAB — LIPASE, BLOOD: Lipase: 37 U/L (ref 11–51)

## 2022-09-13 LAB — BRAIN NATRIURETIC PEPTIDE: B Natriuretic Peptide: 548 pg/mL — ABNORMAL HIGH (ref 0.0–100.0)

## 2022-09-13 MED ORDER — IOHEXOL 350 MG/ML SOLN
100.0000 mL | Freq: Once | INTRAVENOUS | Status: AC | PRN
  Administered 2022-09-13: 100 mL via INTRAVENOUS

## 2022-09-13 MED ORDER — ASPIRIN 81 MG PO CHEW
324.0000 mg | CHEWABLE_TABLET | Freq: Once | ORAL | Status: AC
  Administered 2022-09-13: 324 mg via ORAL
  Filled 2022-09-13: qty 4

## 2022-09-13 MED ORDER — FUROSEMIDE 10 MG/ML IJ SOLN
40.0000 mg | Freq: Once | INTRAMUSCULAR | Status: AC
  Administered 2022-09-13: 40 mg via INTRAVENOUS
  Filled 2022-09-13: qty 4

## 2022-09-13 MED ORDER — FUROSEMIDE 20 MG PO TABS: 20.0000 mg | ORAL_TABLET | Freq: Every day | ORAL | 0 refills | Status: DC

## 2022-09-13 NOTE — ED Notes (Signed)
Pt has ambulated to the bathroom 4x to void since lasix was given.

## 2022-09-13 NOTE — ED Notes (Signed)
Pt ambulated on RA, spo2 95%

## 2022-09-13 NOTE — ED Provider Notes (Signed)
[facility] EMERGENCY DEPARTMENT Provider Note  CSN: [id number] Arrival date & time: 09/13/22 1552  Chief Complaint(s) chest heaviness  HPI Raven Green is a 67 y.o. female with past medical history as below, significant for DM, migraine headaches, hypertension, IBS who presents to the ED with complaint of gastric pain, chest pressure, heaviness.  Patient reports been having epigastric and right upper quadrant pressure, heaviness, fullness over the past year; not acutely worsened.  She began having chest pressure, heaviness over the past 2 days, gradually worsening.  Patient with difficulty sleeping last night secondary to chest heaviness, when she closes her eyes she feels like she is going to "choke" or "not get enough air."  No recent medication or diet changes, no fevers, no nausea or vomiting, no change in bowel or bladder function, no recent falls or head injuries.  No palpitations.  Reports fatigue over the past 3 weeks worsened from prior.   Past Medical History Past Medical History:  Diagnosis Date   Arthritis    Diabetes mellitus without complication ([place])    gestational   Endometriosis    History of migraine headaches    Hypertension    IBS (irritable bowel syndrome)    Patient Active Problem List   Diagnosis Date Noted   RUQ pain 05/05/2021   Abdominal pain 04/11/2021   Bloating 04/11/2021   IBS (irritable bowel syndrome) 04/11/2021   Arthritis of knee, right 04/30/2014   Splenic artery aneurysm ([place]) 06/25/2013   Hematuria 06/25/2013   Degenerative disc disease, lumbar 06/25/2013   Home Medication(s) Prior to Admission medications   Medication Sig Start Date End Date Taking? Authorizing Provider  chlorthalidone (HYGROTON) 25 MG tablet Take 12.5 mg by mouth in the morning.   Yes [provider]  furosemide (LASIX) 20 MG tablet Take 1 tablet (20 mg total) by mouth daily for 14 days. 09/13/22 09/27/22 Yes Wynona Dove A, DO  linaGLIPtin-metFORMIN HCl ER  (JENTADUETO XR) 12-998 MG TB24 Take 1 tablet by mouth daily at 6 PM.   Yes [provider]  metoprolol succinate (TOPROL-XL) 25 MG 24 hr tablet Take 12.5 mg by mouth in the morning.   Yes [provider]  naproxen sodium (ALEVE) 220 MG tablet Take 440 mg by mouth daily as needed (pain).   Yes [provider]  TOUJEO MAX SOLOSTAR 300 UNIT/ML Solostar Pen Inject 44 Units into the skin daily at 10 pm. 03/11/21  Yes [provider]                                                                                                                                    Past Surgical History Past Surgical History:  Procedure Laterality Date   BACK SURGERY     BIOPSY  04/20/2021   Procedure: BIOPSY;  Surgeon: Harvel Quale, MD;  Location: [facility];  Service: Gastroenterology;;  small bowel, gastric   BREAST BIOPSY  Left    Fibroadenoma   COLONOSCOPY WITH PROPOFOL N/A 04/20/2021   Procedure: COLONOSCOPY WITH PROPOFOL;  Surgeon: Harvel Quale, MD;  Location: [facility];  Service: Gastroenterology;  Laterality: N/A;   EMBOLIZATION  08/13/2013   Procedure: EMBOLIZATION;  Surgeon: Conrad Lake Don Pedro, MD;  Location: [facility] CATH LAB;  Service: Cardiovascular;;  splenic artery aneurysm   ESOPHAGOGASTRODUODENOSCOPY (EGD) WITH PROPOFOL N/A 04/20/2021   Procedure: ESOPHAGOGASTRODUODENOSCOPY (EGD) WITH PROPOFOL;  Surgeon: Harvel Quale, MD;  Location: [facility];  Service: Gastroenterology;  Laterality: N/A;  9:15   POLYPECTOMY  04/20/2021   Procedure: POLYPECTOMY;  Surgeon: Harvel Quale, MD;  Location: [facility];  Service: Gastroenterology;;   TONSILLECTOMY AND ADENOIDECTOMY     VISCERAL ANGIOGRAM N/A 08/13/2013   Procedure: VISCERAL ANGIOGRAM;  Surgeon: Conrad Schroon Lake, MD;  Location: [facility] CATH LAB;  Service: Cardiovascular;  Laterality: N/A;   Family History Family History  Problem Relation Age of Onset   Deep vein thrombosis Mother     Hyperlipidemia Mother    Hypertension Mother    Varicose Veins Mother    Other Mother        lupus anticoagulation   Diabetes Father    Hypertension Father    Peripheral vascular disease Father    Cancer Father        bladder   Diabetes Sister    Cancer Maternal Grandmother        breast   Breast cancer Maternal Grandmother    Gallstones Maternal Grandmother        gallstone stuck in liver duct, passed away while in  hospital   Cancer Maternal Grandfather        bladder    Social History Social History   Tobacco Use   Smoking status: Never   Smokeless tobacco: Never  Vaping Use   Vaping Use: Never used  Substance Use Topics   Alcohol use: Not Currently   Drug use: No   Allergies Codeine and Sulfa antibiotics  Review of Systems Review of Systems  Constitutional:  Positive for fatigue. Negative for chills and fever.  HENT:  Negative for facial swelling and trouble swallowing.   Eyes:  Negative for photophobia and visual disturbance.  Respiratory:  Negative for cough and shortness of breath.   Cardiovascular:  Positive for chest pain. Negative for palpitations.  Gastrointestinal:  Positive for abdominal pain. Negative for nausea and vomiting.  Endocrine: Negative for polydipsia and polyuria.  Genitourinary:  Negative for difficulty urinating and hematuria.  Musculoskeletal:  Negative for gait problem and joint swelling.  Skin:  Negative for pallor and rash.  Neurological:  Negative for syncope and headaches.  Psychiatric/Behavioral:  Negative for agitation and confusion.     Physical Exam Vital Signs  I have reviewed the triage vital signs BP 123/84   Pulse 98   Temp 98.4 F (36.9 C) (Oral)   Resp (!) 24   Ht '5\' 4"'$  (1.626 m)   Wt 100.2 kg   SpO2 98%   BMI 37.93 kg/m  Physical Exam Vitals and nursing note reviewed.  Constitutional:      General: She is not in acute distress.    Appearance: Normal appearance. She is not diaphoretic.  HENT:     Head:  Normocephalic and atraumatic.     Right Ear: External ear normal.     Left Ear: External ear normal.     Nose: Nose normal.     Mouth/Throat:     Mouth:  Mucous membranes are moist.  Eyes:     General: No scleral icterus.       Right eye: No discharge.        Left eye: No discharge.  Cardiovascular:     Rate and Rhythm: Normal rate and regular rhythm.     Pulses: Normal pulses.     Heart sounds: Normal heart sounds.  Pulmonary:     Effort: Pulmonary effort is normal. No respiratory distress.     Breath sounds: Normal breath sounds.  Abdominal:     General: Abdomen is flat.     Palpations: Abdomen is soft.     Tenderness: There is no abdominal tenderness.  Musculoskeletal:        General: Normal range of motion.     Cervical back: Normal range of motion.     Right lower leg: No edema.     Left lower leg: No edema.  Skin:    General: Skin is warm and dry.     Capillary Refill: Capillary refill takes less than 2 seconds.  Neurological:     Mental Status: She is alert and oriented to person, place, and time.     GCS: GCS eye subscore is 4. GCS verbal subscore is 5. GCS motor subscore is 6.  Psychiatric:        Mood and Affect: Mood normal.        Behavior: Behavior normal.     ED Results and Treatments Labs (all labs ordered are listed, but only abnormal results are displayed) Labs Reviewed  BASIC METABOLIC PANEL - Abnormal; Notable for the following components:      Result Value   Potassium 3.1 (*)    Glucose, Bld 171 (*)    Creatinine, Ser 1.10 (*)    Calcium 8.6 (*)    GFR, Estimated 55 (*)    All other components within normal limits  CBC - Abnormal; Notable for the following components:   Hemoglobin 11.6 (*)    All other components within normal limits  BRAIN NATRIURETIC PEPTIDE - Abnormal; Notable for the following components:   B Natriuretic Peptide 548.0 (*)    All other components within normal limits  TROPONIN I (HIGH SENSITIVITY) - Abnormal; Notable for  the following components:   Troponin I (High Sensitivity) 19 (*)    All other components within normal limits  TROPONIN I (HIGH SENSITIVITY) - Abnormal; Notable for the following components:   Troponin I (High Sensitivity) 21 (*)    All other components within normal limits  HEPATIC FUNCTION PANEL  LIPASE, BLOOD                                                                                                                          Radiology CT Angio Chest PE W and/or Wo Contrast  Result Date: 09/13/2022 CLINICAL DATA:  Epigastric pain EXAM: CT ANGIOGRAPHY CHEST CT ABDOMEN AND PELVIS WITH CONTRAST TECHNIQUE: Multidetector CT imaging of the chest was performed using  the standard protocol during bolus administration of intravenous contrast. Multiplanar CT image reconstructions and MIPs were obtained to evaluate the vascular anatomy. Multidetector CT imaging of the abdomen and pelvis was performed using the standard protocol during bolus administration of intravenous contrast. RADIATION DOSE REDUCTION: This exam was performed according to the departmental dose-optimization program which includes automated exposure control, adjustment of the mA and/or kV according to patient size and/or use of iterative reconstruction technique. CONTRAST:  164m OMNIPAQUE IOHEXOL 350 MG/ML SOLN COMPARISON:  None Available. FINDINGS: CTA CHEST FINDINGS Cardiovascular: No evidence of pulmonary embolus. Normal heart size. No pericardial effusion. Normal caliber thoracic aorta with moderate atherosclerotic disease. Dilated main pulmonary artery, measuring up to 3.7 cm. Mediastinum/Nodes: Thyroid esophagus are unremarkable. No pathologically enlarged lymph nodes seen in the chest. Lungs/Pleura: Central airways are patent. No consolidation, pleural effusion or pneumothorax. Mild diffuse ground-glass opacities. Trace bilateral pleural effusions. Musculoskeletal: No chest wall abnormality. No acute or significant osseous findings.  Review of the MIP images confirms the above findings. CT ABDOMEN and PELVIS FINDINGS Hepatobiliary: No focal liver abnormality is seen. No gallstones, gallbladder wall thickening, or biliary dilatation. Pancreas: Unremarkable. No pancreatic ductal dilatation or surrounding inflammatory changes. Embolization coils seen adjacent to the tail of the pancreas. Spleen: Normal in size without focal abnormality. Adrenals/Urinary Tract: Bilateral adrenal glands are unremarkable. No hydronephrosis or nephrolithiasis cyst. Simple appearing left renal cysts, no specific follow-up imaging is recommended. Bladder is unremarkable. Stomach/Bowel: Stomach is within normal limits. Appendix appears normal. No evidence of bowel wall thickening, distention, or inflammatory changes. Vascular/Lymphatic: Aortic atherosclerosis. No enlarged abdominal or pelvic lymph nodes. Reproductive: Uterus and bilateral adnexa are unremarkable. Other: No abdominal wall hernia or abnormality. No abdominopelvic ascites. Musculoskeletal: No acute or significant osseous findings. Review of the MIP images confirms the above findings. IMPRESSION: 1. No evidence of pulmonary embolus. 2. Mild diffuse ground-glass opacities and trace bilateral pleural effusions, likely due to pulmonary edema. 3. Dilated main pulmonary artery, findings can be seen in the setting of pulmonary hypertension. 4. No acute findings in the abdomen or pelvis. 5. Aortic Atherosclerosis (ICD10-I70.0). Electronically Signed   By: LYetta GlassmanM.D.   On: 09/13/2022 20:27   CT ABDOMEN PELVIS W CONTRAST  Result Date: 09/13/2022 CLINICAL DATA:  Epigastric pain EXAM: CT ANGIOGRAPHY CHEST CT ABDOMEN AND PELVIS WITH CONTRAST TECHNIQUE: Multidetector CT imaging of the chest was performed using the standard protocol during bolus administration of intravenous contrast. Multiplanar CT image reconstructions and MIPs were obtained to evaluate the vascular anatomy. Multidetector CT imaging of the  abdomen and pelvis was performed using the standard protocol during bolus administration of intravenous contrast. RADIATION DOSE REDUCTION: This exam was performed according to the departmental dose-optimization program which includes automated exposure control, adjustment of the mA and/or kV according to patient size and/or use of iterative reconstruction technique. CONTRAST:  1016mOMNIPAQUE IOHEXOL 350 MG/ML SOLN COMPARISON:  None Available. FINDINGS: CTA CHEST FINDINGS Cardiovascular: No evidence of pulmonary embolus. Normal heart size. No pericardial effusion. Normal caliber thoracic aorta with moderate atherosclerotic disease. Dilated main pulmonary artery, measuring up to 3.7 cm. Mediastinum/Nodes: Thyroid esophagus are unremarkable. No pathologically enlarged lymph nodes seen in the chest. Lungs/Pleura: Central airways are patent. No consolidation, pleural effusion or pneumothorax. Mild diffuse ground-glass opacities. Trace bilateral pleural effusions. Musculoskeletal: No chest wall abnormality. No acute or significant osseous findings. Review of the MIP images confirms the above findings. CT ABDOMEN and PELVIS FINDINGS Hepatobiliary: No focal liver abnormality is seen. No gallstones, gallbladder  wall thickening, or biliary dilatation. Pancreas: Unremarkable. No pancreatic ductal dilatation or surrounding inflammatory changes. Embolization coils seen adjacent to the tail of the pancreas. Spleen: Normal in size without focal abnormality. Adrenals/Urinary Tract: Bilateral adrenal glands are unremarkable. No hydronephrosis or nephrolithiasis cyst. Simple appearing left renal cysts, no specific follow-up imaging is recommended. Bladder is unremarkable. Stomach/Bowel: Stomach is within normal limits. Appendix appears normal. No evidence of bowel wall thickening, distention, or inflammatory changes. Vascular/Lymphatic: Aortic atherosclerosis. No enlarged abdominal or pelvic lymph nodes. Reproductive: Uterus and  bilateral adnexa are unremarkable. Other: No abdominal wall hernia or abnormality. No abdominopelvic ascites. Musculoskeletal: No acute or significant osseous findings. Review of the MIP images confirms the above findings. IMPRESSION: 1. No evidence of pulmonary embolus. 2. Mild diffuse ground-glass opacities and trace bilateral pleural effusions, likely due to pulmonary edema. 3. Dilated main pulmonary artery, findings can be seen in the setting of pulmonary hypertension. 4. No acute findings in the abdomen or pelvis. 5. Aortic Atherosclerosis (ICD10-I70.0). Electronically Signed   By: Yetta Glassman M.D.   On: 09/13/2022 20:27   DG Chest 2 View  Result Date: 09/13/2022 CLINICAL DATA:  Chest tightness and heaviness for several weeks worsened since last night EXAM: CHEST - 2 VIEW COMPARISON:  08/24/2021 FINDINGS: Enlargement of cardiac silhouette with pulmonary vascular congestion. Mediastinal contours normal. Lungs clear. No acute infiltrate, pleural effusion, or pneumothorax. Prior cervicothoracic fusion and scattered degenerative disc disease changes thoracic spine. Embolization coils LEFT upper quadrant. IMPRESSION: Enlargement of cardiac silhouette with pulmonary vascular congestion. No acute infiltrate. Electronically Signed   By: Lavonia Dana M.D.   On: 09/13/2022 16:27    Pertinent labs & imaging results that were available during my care of the patient were reviewed by me and considered in my medical decision making (see MDM for details).  Medications Ordered in ED Medications  aspirin chewable tablet 324 mg (324 mg Oral Given 09/13/22 1904)  iohexol (OMNIPAQUE) 350 MG/ML injection 100 mL (100 mLs Intravenous Contrast Given 09/13/22 2011)  furosemide (LASIX) injection 40 mg (40 mg Intravenous Given 09/13/22 2103)                                                                                                                                     Procedures .Critical Care  Performed by: Jeanell Sparrow, DO Authorized by: Jeanell Sparrow, DO   Critical care provider statement:    Critical care time (minutes):  30   Critical care time was exclusive of:  Separately billable procedures and treating other patients   Critical care was necessary to treat or prevent imminent or life-threatening deterioration of the following conditions:  Cardiac failure   Critical care was time spent personally by me on the following activities:  Development of treatment plan with patient or surrogate, discussions with consultants, evaluation of patient's response to treatment, examination of patient, ordering and review of laboratory studies, ordering  and review of radiographic studies, ordering and performing treatments and interventions, pulse oximetry, re-evaluation of patient's condition, review of old charts and obtaining history from patient or surrogate   Care discussed with: admitting provider     (including critical care time)  Medical Decision Making / ED Course   MDM:  TAVIE HASEMAN is a 67 y.o. female with past medical history as below, significant for DM, migraine headaches, hypertension, IBS who presents to the ED with complaint of gastric pain, chest pressure, heaviness.. The complaint involves an extensive differential diagnosis and also carries with it a high risk of complications and morbidity.  Serious etiology was considered. Ddx includes but is not limited to: Differential includes all life-threatening causes for chest pain. This includes but is not exclusive to acute coronary syndrome, aortic dissection, pulmonary embolism, cardiac tamponade, community-acquired pneumonia, pericarditis, musculoskeletal chest wall pain, etc.  On initial assessment the patient is: Resting comfortably on room air, HDS. Vital signs and nursing notes were reviewed  Clinical Course as of 09/14/22 0042  Wed Sep 13, 2022  2109 Pulse ox 95% w/ ambulation  [SG]  2109 B Natriuretic Peptide(!): 548.0 BNP is  elevated, she has slight pleural edema on CT; will give lasix and recheck. She is not hypoxic, she has mild chest discomfort. Mild trop leak.  [SG]  2224 Pt feeling better, she is not hypoxic. Urinating appropriately w/ lasix [SG]    Clinical Course User Index [SG] Wynona Dove A, DO   Patient elevated BNP, pleural edema on CT.  She is not hypoxic.  She is started on diuresis in the ED.  She is feeling better after diuresis.  No longer having chest pains having chest tightness.  She is amatory with a steady gait.  Not hypoxic with exertion.  Several possible new onset CHF.  Also concern for possible pulmonary hypertension on CT imaging.  Will start Lasix for home.  Ambulatory referral to cardiology was placed.  Recommend outpatient echo.  Patient may also require evaluation by pulmonology for pulm hypertension evaluation.  Patient has slight troponin leak at 21, no ongoing chest pain.  There is likely demand ischemia from CHF.  Labs otherwise stable, she is stable discharge with outpatient cardiology and pulmonology evaluation.  The patient improved significantly and was discharged in stable condition. Detailed discussions were had with the patient regarding current findings, and need for close f/u with PCP or on call doctor. The patient has been instructed to return immediately if the symptoms worsen in any way for re-evaluation. Patient verbalized understanding and is in agreement with current care plan. All questions answered prior to discharge.    Additional history obtained: -Additional history obtained from spouse -External records from outside source obtained and reviewed including: Chart review including previous notes, labs, imaging, consultation notes including prior urgent care visits, primary care documentation, prior labs and imaging   Lab Tests: -I ordered, reviewed, and interpreted labs.   The pertinent results include:   Labs Reviewed  BASIC METABOLIC PANEL - Abnormal;  Notable for the following components:      Result Value   Potassium 3.1 (*)    Glucose, Bld 171 (*)    Creatinine, Ser 1.10 (*)    Calcium 8.6 (*)    GFR, Estimated 55 (*)    All other components within normal limits  CBC - Abnormal; Notable for the following components:   Hemoglobin 11.6 (*)    All other components within normal limits  BRAIN NATRIURETIC PEPTIDE -  Abnormal; Notable for the following components:   B Natriuretic Peptide 548.0 (*)    All other components within normal limits  TROPONIN I (HIGH SENSITIVITY) - Abnormal; Notable for the following components:   Troponin I (High Sensitivity) 19 (*)    All other components within normal limits  TROPONIN I (HIGH SENSITIVITY) - Abnormal; Notable for the following components:   Troponin I (High Sensitivity) 21 (*)    All other components within normal limits  HEPATIC FUNCTION PANEL  LIPASE, BLOOD    Notable for as above  EKG   EKG Interpretation  Date/Time:  Wednesday September 13 2022 16:10:41 EST Ventricular Rate:  97 PR Interval:  182 QRS Duration: 88 QT Interval:  382 QTC Calculation: 485 R Axis:   -17 Text Interpretation: Normal sinus rhythm Anterior infarct , age undetermined Abnormal ECG When compared with ECG of 13-Aug-2013 07:09, Anterior infarct is now Present QT has lengthened similar to prior Confirmed by Wynona Dove ([id number]) on 09/13/2022 5:52:13 PM         Imaging Studies ordered: I ordered imaging studies including CTAP CTPE CXR I independently visualized the following imaging with scope of interpretation limited to determining acute life threatening conditions related to emergency care: CHF, ?pulm htn I independently visualized and interpreted imaging. I agree with the radiologist interpretation   Medicines ordered and prescription drug management: Meds ordered this encounter  Medications   aspirin chewable tablet 324 mg   iohexol (OMNIPAQUE) 350 MG/ML injection 100 mL   furosemide (LASIX)  injection 40 mg   furosemide (LASIX) 20 MG tablet    Sig: Take 1 tablet (20 mg total) by mouth daily for 14 days.    Dispense:  14 tablet    Refill:  0    -I have reviewed the patients home medicines and have made adjustments as needed   Consultations Obtained: na   Cardiac Monitoring: The patient was maintained on a cardiac monitor.  I personally viewed and interpreted the cardiac monitored which showed an underlying rhythm of: NSR  Social Determinants of Health:  Diagnosis or treatment significantly limited by social determinants of health: na   Reevaluation: After the interventions noted above, I reevaluated the patient and found that they have improved  Co morbidities that complicate the patient evaluation  Past Medical History:  Diagnosis Date   Arthritis    Diabetes mellitus without complication ([place])    gestational   Endometriosis    History of migraine headaches    Hypertension    IBS (irritable bowel syndrome)       Dispostion: Disposition decision including need for hospitalization was considered, and patient discharged from emergency department.    Final Clinical Impression(s) / ED Diagnoses Final diagnoses:  Acute congestive heart failure, unspecified heart failure type (HCC)  Dyspnea, unspecified type     This chart was dictated using voice recognition software.  Despite best efforts to proofread,  errors can occur which can change the documentation meaning.    Jeanell Sparrow, DO 09/14/22 [phone]

## 2022-09-13 NOTE — ED Notes (Signed)
Patient transported to CT 

## 2022-09-13 NOTE — ED Triage Notes (Signed)
Pt in lobby waiting for husband to get out of bathroom before she will come back to triage.

## 2022-09-13 NOTE — Discharge Instructions (Addendum)
Please follow up with cardiology in regards to congestive heart failure. Would recommend an echocardiogram. Depending on this you may need to also be seen by a lung doctor given possible pulmonary hypertension on you imaging today  It was a pleasure caring for you today in the emergency department.  Please return to the emergency department for any worsening or worrisome symptoms.

## 2022-09-13 NOTE — ED Triage Notes (Signed)
Pt reports she has had chest heaviness for several weeks that is worse with exertion, also reports she has been having abdominal swelling during this time as well.

## 2022-09-15 ENCOUNTER — Encounter: Payer: Self-pay | Admitting: Internal Medicine

## 2022-09-15 ENCOUNTER — Encounter: Payer: Self-pay | Admitting: *Deleted

## 2022-09-15 ENCOUNTER — Ambulatory Visit: Payer: Medicare Other | Attending: Internal Medicine | Admitting: Internal Medicine

## 2022-09-15 VITALS — BP 153/89 | HR 98 | Ht 64.0 in | Wt 224.6 lb

## 2022-09-15 DIAGNOSIS — R079 Chest pain, unspecified: Secondary | ICD-10-CM

## 2022-09-15 DIAGNOSIS — I10 Essential (primary) hypertension: Secondary | ICD-10-CM

## 2022-09-15 MED ORDER — FUROSEMIDE 20 MG PO TABS: 20.0000 mg | ORAL_TABLET | Freq: Every day | ORAL | 11 refills | Status: DC

## 2022-09-15 MED ORDER — METOPROLOL TARTRATE 25 MG PO TABS: 25.0000 mg | ORAL_TABLET | Freq: Two times a day (BID) | ORAL | 3 refills | Status: DC

## 2022-09-15 MED ORDER — NITROGLYCERIN 0.4 MG SL SUBL: 0.4000 mg | SUBLINGUAL_TABLET | SUBLINGUAL | 3 refills | Status: AC | PRN

## 2022-09-15 NOTE — Progress Notes (Signed)
Cardiology Office Note  Date: 09/15/2022   ID: Raven, Green Jul 25, 1956, MRN [id number]  PCP:  Celene Squibb, MD  Cardiologist:  None Electrophysiologist:  None   Reason for Office Visit: Evaluation chest heaviness at the request of Dr. Pearline Cables   History of Present Illness: Raven Green is a 67 y.o. female known to have HTN, DM 2, IBS, splenic artery aneurysm s/p embolization in 2014 was referred to cardiology clinic for evaluation of chest heaviness.  Patient has been having daily substernal exertional chest heaviness x 2 months, lasting for few minutes and resolves with rest. Associated with severe exertional fatigue and SOB.  She also reported having abdominal tightness that starts followed by chest heaviness. She has been having abdominal tightness for quite a while for which she underwent EGD and colonoscopy which were unremarkable. She had ER visit on 09/13/2022 for evaluation of chest heaviness and DOE. Due to imaging evidence of pulmonary edema, she was given IV Lasix with significant improvement in her symptoms.  She was just discharged on Lasix 20 mg once daily. Her pulmonary artery was also enlarged to 3.7 cm on CT chest. High-sensitivity troponins were mildly elevated at 19 and 21. Denies any dizziness, syncope, presyncope.  She does have leg swelling whenever she sits for a long time and so avoids it but recently has not seen much leg swelling. Denies smoking cigarettes, illicit drug abuse and alcohol use.  Past Medical History:  Diagnosis Date   Arthritis    Diabetes mellitus without complication ([place])    gestational   Endometriosis    History of migraine headaches    Hypertension    IBS (irritable bowel syndrome)     Past Surgical History:  Procedure Laterality Date   BACK SURGERY     BIOPSY  04/20/2021   Procedure: BIOPSY;  Surgeon: Montez Morita, Quillian Quince, MD;  Location: [facility];  Service: Gastroenterology;;  small bowel, gastric   BREAST BIOPSY Left     Fibroadenoma   COLONOSCOPY WITH PROPOFOL N/A 04/20/2021   Procedure: COLONOSCOPY WITH PROPOFOL;  Surgeon: Harvel Quale, MD;  Location: [facility];  Service: Gastroenterology;  Laterality: N/A;   EMBOLIZATION  08/13/2013   Procedure: EMBOLIZATION;  Surgeon: Conrad Rutland, MD;  Location: [facility] CATH LAB;  Service: Cardiovascular;;  splenic artery aneurysm   ESOPHAGOGASTRODUODENOSCOPY (EGD) WITH PROPOFOL N/A 04/20/2021   Procedure: ESOPHAGOGASTRODUODENOSCOPY (EGD) WITH PROPOFOL;  Surgeon: Harvel Quale, MD;  Location: [facility];  Service: Gastroenterology;  Laterality: N/A;  9:15   POLYPECTOMY  04/20/2021   Procedure: POLYPECTOMY;  Surgeon: Harvel Quale, MD;  Location: [facility];  Service: Gastroenterology;;   TONSILLECTOMY AND ADENOIDECTOMY     VISCERAL ANGIOGRAM N/A 08/13/2013   Procedure: VISCERAL ANGIOGRAM;  Surgeon: Conrad Macon, MD;  Location: [facility] CATH LAB;  Service: Cardiovascular;  Laterality: N/A;    Current Outpatient Medications  Medication Sig Dispense Refill   chlorthalidone (HYGROTON) 25 MG tablet Take 12.5 mg by mouth in the morning.     furosemide (LASIX) 20 MG tablet Take 1 tablet (20 mg total) by mouth daily for 14 days. 14 tablet 0   linaGLIPtin-metFORMIN HCl ER (JENTADUETO XR) 12-998 MG TB24 Take 1 tablet by mouth daily at 6 PM.     metoprolol succinate (TOPROL-XL) 25 MG 24 hr tablet Take 12.5 mg by mouth in the morning.     TOUJEO MAX SOLOSTAR 300 UNIT/ML Solostar Pen Inject 44 Units into the skin daily  at 10 pm.     naproxen sodium (ALEVE) 220 MG tablet Take 440 mg by mouth daily as needed (pain). (Patient not taking: Reported on 09/15/2022)     No current facility-administered medications for this visit.   Allergies:  Codeine and Sulfa antibiotics   Social History: The patient  reports that she has never smoked. She has never used smokeless tobacco. She reports that she does not currently use alcohol. She reports that she does  not use drugs.   Family History: The patient's family history includes Breast cancer in her maternal grandmother; Cancer in her father, maternal grandfather, and maternal grandmother; Deep vein thrombosis in her mother; Diabetes in her father and sister; Gallstones in her maternal grandmother; Hyperlipidemia in her mother; Hypertension in her father and mother; Other in her mother; Peripheral vascular disease in her father; Varicose Veins in her mother.   ROS:  Please see the history of present illness. Otherwise, complete review of systems is positive for none.  All other systems are reviewed and negative.   Physical Exam: VS:  There were no vitals taken for this visit., BMI There is no height or weight on file to calculate BMI.  Wt Readings from Last 3 Encounters:  09/13/22 221 lb (100.2 kg)  05/05/21 226 lb 6.4 oz (102.7 kg)  04/20/21 226 lb 11.2 oz (102.8 kg)    General: Patient appears comfortable at rest. HEENT: Conjunctiva and lids normal, oropharynx clear with moist mucosa. Neck: Supple, no elevated JVP or carotid bruits, no thyromegaly. Lungs: Clear to auscultation, nonlabored breathing at rest. Cardiac: Regular rate and rhythm, no S3 or significant systolic murmur, no pericardial rub. Abdomen: Soft, nontender, no hepatomegaly, bowel sounds present, no guarding or rebound. Extremities: No pitting edema, distal pulses 2+. Skin: Warm and dry. Musculoskeletal: No kyphosis. Neuropsychiatric: Alert and oriented x3, affect grossly appropriate.  ECG:  An ECG dated 09/13/2022 was personally reviewed today and demonstrated:  Normal sinus rhythm and Q waves in anterior leads (V1 to V4).  EKG from 2014 showed no evidence of Q waves in anterior leads.  Recent Labwork: 09/13/2022: ALT 30; AST 31; B Natriuretic Peptide 548.0; BUN 23; Creatinine, Ser 1.10; Hemoglobin 11.6; Platelets 263; Potassium 3.1; Sodium 137  No results found for: "CHOL", "TRIG", "HDL", "CHOLHDL", "VLDL", "LDLCALC",  "LDLDIRECT"  Other Studies Reviewed Today: I have reviewed the ER notes from 09/13/2022  Assessment and Plan: Patient is a 67 year old F known to have HTN, DM 2, IBS, splenic artery aneurysm s/p embolization in 2014 was referred to cardiology clinic for evaluation of chest heaviness.  # Cardiac chest pain -Patient has substernal exertional chest heaviness with abdominal tightness, DOE and exertional fatigue x 2 months prompting ER visit for further evaluation. Imaging showed pulmonary edema and she benefited from Lasix administration. EKG showed normal sinus rhythm and Q waves in anterior leads (V1 to V4) which is new compared to her old EKG from 2014. High sensitivity troponins were mildly elevated at 19 and 21. I will obtain 2D echocardiogram and pharmacological nuclear stress test. Low threshold to perform LHC on her due to her symptoms and risk factors. -Switch metoprolol succinate to metoprolol tartrate 25 mg twice daily and SL NTG 0.4 mg as needed for breakthrough chest pains.  # HTN, unclear control -Switch metoprolol succinate to metoprolol tartrate 25 mg twice daily -Continue chlorthalidone 12.5 mg and Lasix 20 mg once daily (will need to hold chlorthalidone based on the echo report). -Instructed patient to check her blood pressures in  a.m. and p.m. at home.  I have spent a total of 45 minutes with patient reviewing chart, EKGs, labs and examining patient as well as establishing an assessment and plan that was discussed with the patient.  > 50% of time was spent in direct patient care.       Medication Adjustments/Labs and Tests Ordered: Current medicines are reviewed at length with the patient today.  Concerns regarding medicines are outlined above.   Tests Ordered: No orders of the defined types were placed in this encounter.   Medication Changes: No orders of the defined types were placed in this encounter.   Disposition:  Follow up  1 month or sooner if abnormal imaging  results  Signed, Jhovani Griswold Fidel Levy, MD, 09/15/2022 11:03 AM    [place] [facility] [address] [place], [place] [address]

## 2022-09-15 NOTE — Patient Instructions (Signed)
Medication Instructions:   Stop Taking Toprol XL  Start Taking Lopressor 25 mg Two Times Daily  Start Taking Nitro 0.4 mg as needed   *If you need a refill on your cardiac medications before your next appointment, please call your pharmacy*   Lab Work: NONE   If you have labs (blood work) drawn today and your tests are completely normal, you will receive your results only by: [place] (if you have MyChart) OR A paper copy in the mail If you have any lab test that is abnormal or we need to change your treatment, we will call you to review the results.   Testing/Procedures: Your physician has requested that you have a lexiscan myoview. For further information please visit HugeFiesta.[email or web address]. Please follow instruction sheet, as given.  Your physician has requested that you have an echocardiogram. Echocardiography is a painless test that uses sound waves to create images of your heart. It provides your doctor with information about the size and shape of your heart and how well your heart's chambers and valves are working. This procedure takes approximately one hour. There are no restrictions for this procedure. Please do NOT wear cologne, perfume, aftershave, or lotions (deodorant is allowed). Please arrive 15 minutes prior to your appointment time.    Follow-Up: At [facility], you and your health needs are our priority.  As part of our continuing mission to provide you with exceptional heart care, we have created designated Provider Care Teams.  These Care Teams include your primary Cardiologist (physician) and Advanced Practice Providers (APPs -  Physician Assistants and Nurse Practitioners) who all work together to provide you with the care you need, when you need it.  We recommend signing up for the patient portal called "MyChart".  Sign up information is provided on this After Visit Summary.  MyChart is used to connect with patients for Virtual Visits (Telemedicine).   Patients are able to view lab/test results, encounter notes, upcoming appointments, etc.  Non-urgent messages can be sent to your provider as well.   To learn more about what you can do with MyChart, go to [email or web address].    Your next appointment:   1 month(s)  Provider:   Claudina Lick, MD    Other Instructions Thank you for choosing [facility]!

## 2022-09-19 ENCOUNTER — Telehealth: Payer: Self-pay | Admitting: Internal Medicine

## 2022-09-19 NOTE — Telephone Encounter (Signed)
Patient notified and verbalized understanding. 

## 2022-09-19 NOTE — Telephone Encounter (Signed)
Pt c/o medication issue:  1. Name of Medication: furosemide (LASIX) 20 MG tablet   2. How are you currently taking this medication (dosage and times per day)? 1 tablet daily  3. Are you having a reaction (difficulty breathing--STAT)? no  4. What is your medication issue? Patient states she was told to hold her metoprolol the morning of her stress test. She would like to know whether she needs to hold her fluid pill as well. She says she is not sure if she needs to hold it that morning or skip taking it the day before.

## 2022-09-21 ENCOUNTER — Encounter (HOSPITAL_COMMUNITY): Payer: Medicare Other

## 2022-09-21 ENCOUNTER — Other Ambulatory Visit (HOSPITAL_COMMUNITY)
Admission: RE | Admit: 2022-09-21 | Discharge: 2022-09-21 | Disposition: A | Discharge status: Home / Self Care | Payer: Medicare Other | Source: Ambulatory Visit | Attending: Internal Medicine | Admitting: Internal Medicine

## 2022-09-21 ENCOUNTER — Telehealth: Payer: Self-pay | Admitting: *Deleted

## 2022-09-21 ENCOUNTER — Telehealth: Payer: Self-pay | Admitting: Internal Medicine

## 2022-09-21 DIAGNOSIS — Z79899 Other long term (current) drug therapy: Secondary | ICD-10-CM

## 2022-09-21 LAB — MAGNESIUM: Magnesium: 2.1 mg/dL (ref 1.7–2.4)

## 2022-09-21 LAB — BASIC METABOLIC PANEL
Anion gap: 10 (ref 5–15)
BUN: 21 mg/dL (ref 8–23)
CO2: 30 mmol/L (ref 22–32)
Calcium: 8.9 mg/dL (ref 8.9–10.3)
Chloride: 97 mmol/L — ABNORMAL LOW (ref 98–111)
Creatinine, Ser: 0.98 mg/dL (ref 0.44–1.00)
GFR, Estimated: >60 mL/min (ref >60)
Glucose, Bld: 148 mg/dL — ABNORMAL HIGH (ref 70–99)
Potassium: 3.3 mmol/L — ABNORMAL LOW (ref 3.5–5.1)
Sodium: 137 mmol/L (ref 135–145)

## 2022-09-21 MED ORDER — MAGNESIUM 400 MG PO TABS: 400.0000 mg | ORAL_TABLET | Freq: Two times a day (BID) | ORAL | 11 refills | Status: DC

## 2022-09-21 MED ORDER — POTASSIUM CHLORIDE CRYS ER 20 MEQ PO TBCR: EXTENDED_RELEASE_TABLET | ORAL | 3 refills | Status: DC

## 2022-09-21 NOTE — Telephone Encounter (Signed)
Call returned and results given. Pt verbalized understanding

## 2022-09-21 NOTE — Telephone Encounter (Signed)
-----  Message from Chalmers Guest, MD sent at 09/21/2022 11:21 AM EST ----- K is 3.3. Start K supplements 40 mEq twice daily for 3 days followed by 20 mEq twice daily.  Mg levels are normal. No need of magnesium supplements. If patient continues to have left leg pain despite electrolyte repletion, she will need to follow up with PCP for further management.

## 2022-09-21 NOTE — Telephone Encounter (Signed)
Pt notified of Dr. Marsa Aris response. Orders placed.

## 2022-09-21 NOTE — Telephone Encounter (Signed)
Vishnu P Mallipeddi, MD 09/21/2022 11:21 AM EST     K is 3.3. Start K supplements 40 mEq twice daily for 3 days followed by 20 mEq twice daily.  Mg levels are normal. No need of magnesium supplements. If patient continues to have left leg pain despite electrolyte repletion, she will need to follow up with PCP for further management.

## 2022-09-21 NOTE — Telephone Encounter (Signed)
Pt states she has a pain going down her left left. She states she was told by the RN if she had problems with the lasix pill to have her magnesium checked. Pt unsure if the pain in her leg is related to the lasix or not, please advise.   Pt also cancelled her stress tests today due to her not feeling well. Pt was not aware she had two scheduled for today

## 2022-09-21 NOTE — Telephone Encounter (Signed)
Patient returned call

## 2022-09-21 NOTE — Telephone Encounter (Signed)
Patient was given wrong instructions by [facility], filled by [facility]  I spoke with pharmacist,Summer, and clarified patients script should read as potassium  40 meq BID x 3 days, THEN 20 meq BID there after.   [facility] pharmacy requested that patient return bottle and get correct rx,patient agrees to do so.

## 2022-09-21 NOTE — Telephone Encounter (Signed)
Called patient with test results. No answer. Left message to call back. New orders placed. Pharmacy contacted to d/c magnesium and first potassium order.

## 2022-09-21 NOTE — Telephone Encounter (Signed)
Pt states that for a few nights she has had leg pain in the left leg. Pt reports the pain last night was 10/10 last night and right now it is 5/10. She tried applying heat to the leg but it did not help. Pt denies that leg is swollen, red or warn to touch. Pt states that she was unaware that she was to do a treadmill stress test. She states that she is unable to do that. Pt is wondering is her potassium, or magnesium may need to be checked. She describes pain as a aching pain. She would like her levels checked before she is given more medicine. Please advise.

## 2022-09-21 NOTE — Telephone Encounter (Signed)
Patient is following up. She states she has questions regarding new prescription.

## 2022-09-26 ENCOUNTER — Other Ambulatory Visit (HOSPITAL_COMMUNITY)
Admission: RE | Admit: 2022-09-26 | Discharge: 2022-09-26 | Disposition: A | Discharge status: Home / Self Care | Payer: Medicare Other | Source: Ambulatory Visit | Attending: Internal Medicine | Admitting: Internal Medicine

## 2022-09-26 DIAGNOSIS — Z79899 Other long term (current) drug therapy: Secondary | ICD-10-CM | POA: Insufficient documentation

## 2022-09-26 LAB — BASIC METABOLIC PANEL
Anion gap: 11 (ref 5–15)
BUN: 20 mg/dL (ref 8–23)
CO2: 23 mmol/L (ref 22–32)
Calcium: 8.7 mg/dL — ABNORMAL LOW (ref 8.9–10.3)
Chloride: 105 mmol/L (ref 98–111)
Creatinine, Ser: 0.97 mg/dL (ref 0.44–1.00)
GFR, Estimated: >60 mL/min (ref >60)
Glucose, Bld: 143 mg/dL — ABNORMAL HIGH (ref 70–99)
Potassium: 4.6 mmol/L (ref 3.5–5.1)
Sodium: 139 mmol/L (ref 135–145)

## 2022-09-26 LAB — MAGNESIUM: Magnesium: 2.2 mg/dL (ref 1.7–2.4)

## 2022-09-27 ENCOUNTER — Telehealth: Payer: Self-pay | Admitting: Internal Medicine

## 2022-09-27 MED ORDER — FUROSEMIDE 40 MG PO TABS: 40.0000 mg | ORAL_TABLET | Freq: Every day | ORAL | 11 refills | Status: DC

## 2022-09-27 NOTE — Telephone Encounter (Signed)
Pt notified and lasix called in to [facility].

## 2022-09-27 NOTE — Telephone Encounter (Signed)
Pt informed to continue Potassium 20 meq two times daily .

## 2022-09-27 NOTE — Telephone Encounter (Signed)
Spoke with pt who states that she has had abdominal swelling since stopping the fluid pill, and that she has gained 2 pounds.Pt reports that she is unable to sleep at night. Please advise.

## 2022-09-27 NOTE — Telephone Encounter (Signed)
Patient was calling to get labs results, and to also say since she is not taking the fluid pills her stupid has swell back.

## 2022-09-28 DIAGNOSIS — I10 Essential (primary) hypertension: Secondary | ICD-10-CM | POA: Diagnosis not present

## 2022-09-28 DIAGNOSIS — E1169 Type 2 diabetes mellitus with other specified complication: Secondary | ICD-10-CM | POA: Diagnosis not present

## 2022-10-03 ENCOUNTER — Telehealth: Payer: Self-pay | Admitting: Internal Medicine

## 2022-10-03 DIAGNOSIS — I10 Essential (primary) hypertension: Secondary | ICD-10-CM | POA: Diagnosis not present

## 2022-10-03 DIAGNOSIS — E1169 Type 2 diabetes mellitus with other specified complication: Secondary | ICD-10-CM | POA: Diagnosis not present

## 2022-10-03 DIAGNOSIS — Z5320 Procedure and treatment not carried out because of patient's decision for unspecified reasons: Secondary | ICD-10-CM | POA: Diagnosis not present

## 2022-10-03 DIAGNOSIS — Z0001 Encounter for general adult medical examination with abnormal findings: Secondary | ICD-10-CM | POA: Diagnosis not present

## 2022-10-03 DIAGNOSIS — E119 Type 2 diabetes mellitus without complications: Secondary | ICD-10-CM | POA: Diagnosis not present

## 2022-10-03 NOTE — Telephone Encounter (Signed)
Pt c/o medication issue:  1. Name of Medication: furosemide (LASIX) 40 MG tablet   2. How are you currently taking this medication (dosage and times per day)? Take 1 tablet (40 mg total) by mouth daily.   3. Are you having a reaction (difficulty breathing--STAT)?   4. What is your medication issue? Patient states since she started taking this medication she gets a charlie horse when she is laying in bed when she goes to turn in bed in the mornings.  It only happens when she is in bed. She said she is also on potassium.

## 2022-10-03 NOTE — Telephone Encounter (Signed)
Vishnu P Mallipeddi, MD 09/21/2022 11:21 AM EST     K is 3.3. Start K supplements 40 mEq twice daily for 3 days followed by 20 mEq twice daily.  Mg levels are normal. No need of magnesium supplements. If patient continues to have left leg pain despite electrolyte repletion, she will need to follow up with PCP for further management.     Potassium 4.6 on 09/26/22 Vishnu P Mallipeddi, MD 09/28/2022 10:30 AM EST     Normal serum K, Mg and creatinine. Continue Lasix as instructed.    I will advise patient to follow up with her pcp  She said Dr.Mallipeddi advised her to wear compression stockings, she will buy some and try them out.

## 2022-10-05 ENCOUNTER — Ambulatory Visit (HOSPITAL_COMMUNITY)
Admission: RE | Admit: 2022-10-05 | Discharge: 2022-10-05 | Disposition: A | Discharge status: Home / Self Care | Payer: Medicare Other | Source: Ambulatory Visit | Attending: Internal Medicine | Admitting: Internal Medicine

## 2022-10-05 DIAGNOSIS — R079 Chest pain, unspecified: Secondary | ICD-10-CM

## 2022-10-05 LAB — ECHOCARDIOGRAM COMPLETE
AR max vel: 1.87 cm2
AV Area VTI: 2.05 cm2
AV Area mean vel: 2.16 cm2
AV Mean grad: 4 mmHg
AV Peak grad: 8 mmHg
Ao pk vel: 1.42 m/s
Area-P 1/2: 4.06 cm2
Calc EF: 42.7 %
MV M vel: 4.87 m/s
MV Peak grad: 94.9 mmHg
Radius: 0.3 cm
S' Lateral: 5.35 cm
Single Plane A2C EF: 53.7 %
Single Plane A4C EF: 37.7 %

## 2022-10-05 NOTE — Progress Notes (Signed)
*  PRELIMINARY RESULTS* Echocardiogram 2D Echocardiogram has been performed.  [place] 10/05/2022, 9:30 AM

## 2022-10-06 ENCOUNTER — Telehealth: Payer: Self-pay

## 2022-10-06 NOTE — Telephone Encounter (Signed)
-----   Message from Chalmers Guest, MD sent at 10/05/2022  6:41 PM EST ----- Heart pumping function is decreased to 30-35% (normal should be 55%). Please schedule the patient to see me on 10/06/2022 (overbook okay) or next week to schedule LHC. Cancel Nuclear stress test.

## 2022-10-06 NOTE — Telephone Encounter (Signed)
Patient notified and verbalized understanding. Patient had no further questions at this time. Pt scheduled to see provider on 2/12 @ 1:40 pm in the [place] office. PCP copied.

## 2022-10-09 ENCOUNTER — Encounter: Payer: Self-pay | Admitting: Internal Medicine

## 2022-10-09 ENCOUNTER — Ambulatory Visit (HOSPITAL_COMMUNITY): Payer: Medicare Other

## 2022-10-09 ENCOUNTER — Ambulatory Visit: Payer: Medicare Other | Attending: Internal Medicine | Admitting: Internal Medicine

## 2022-10-09 ENCOUNTER — Telehealth: Payer: Self-pay | Admitting: Internal Medicine

## 2022-10-09 VITALS — BP 146/79 | HR 83 | Ht 64.5 in | Wt 213.2 lb

## 2022-10-09 DIAGNOSIS — R252 Cramp and spasm: Secondary | ICD-10-CM

## 2022-10-09 DIAGNOSIS — R079 Chest pain, unspecified: Secondary | ICD-10-CM

## 2022-10-09 DIAGNOSIS — I272 Pulmonary hypertension, unspecified: Secondary | ICD-10-CM

## 2022-10-09 DIAGNOSIS — R29898 Other symptoms and signs involving the musculoskeletal system: Secondary | ICD-10-CM

## 2022-10-09 DIAGNOSIS — I429 Cardiomyopathy, unspecified: Secondary | ICD-10-CM

## 2022-10-09 NOTE — Patient Instructions (Addendum)
Medication Instructions:  Your physician recommends that you continue on your current medications as directed. Please refer to the Current Medication list given to you today.  Labwork: none  Testing/Procedures: Your physician has requested that you have a cardiac catheterization. Cardiac catheterization is used to diagnose and/or treat various heart conditions. Doctors may recommend this procedure for a number of different reasons. The most common reason is to evaluate chest pain. Chest pain can be a symptom of coronary artery disease (CAD), and cardiac catheterization can show whether plaque is narrowing or blocking your heart's arteries. This procedure is also used to evaluate the valves, as well as measure the blood flow and oxygen levels in different parts of your heart. For further information please visit HugeFiesta.tn. Please follow instruction sheet, as given. Your physician has requested that you have a lower extremity arterial exercise duplex with ABI's. During this test, exercise and ultrasound are used to evaluate arterial blood flow in the legs. Allow one hour for this exam. There are no restrictions or special instructions.  Follow-Up: Your physician recommends that you schedule a follow-up appointment in: 1 month  Any Other Special Instructions Will Be Listed Below (If Applicable).  If you need a refill on your cardiac medications before your next appointment, please call your pharmacy.  [place] [facility] [place] AT [address] [id number] [place] [address] Dept: [phone] Loc: [phone]  TONI SCHNIPKE  10/09/2022  You are scheduled for a Cardiac Catheterization on Thursday, February 15 with Dr. Shelva Majestic.  1. Please arrive at the [facility] (Main Entrance A) at [facility]: [address] [place], [place] [address] at 7:00 AM (This time is two hours before your  procedure to ensure your preparation). Free valet parking service is available.   Special note: Every effort is made to have your procedure done on time. Please understand that emergencies sometimes delay scheduled procedures.  2. Diet: Do not eat solid foods after midnight.  The patient may have clear liquids until 5am upon the day of the procedure.  3. Labs: done on 09/26/2022  4. Medication instructions in preparation for your procedure: on the morning of your heart cath, Hold furosemide, potassium, and jentaduet xr--you may take the rest of your medications that morning with a sip of water   Contrast Allergy: No  Take only 22 units of insulin the night before your procedure. Do not take any insulin on the day of the procedure.  Do not take Diabetes Med Jentadueto XR on the day of the procedure and HOLD 48 HOURS AFTER THE PROCEDURE.  On the morning of your procedure, take your Aspirin 81 mg and any morning medicines NOT listed above.  You may use sips of water.  5. Plan for one night stay--bring personal belongings. 6. Bring a current list of your medications and current insurance cards. 7. You MUST have a responsible person to drive you home. 8. Someone MUST be with you the first 24 hours after you arrive home or your discharge will be delayed. 9. Please wear clothes that are easy to get on and off and wear slip-on shoes.  Thank you for allowing [place] to care for you!   -- [facility]

## 2022-10-09 NOTE — Telephone Encounter (Signed)
PERCERT:  Right and Left Heart Cath dx: new onset cardiomyopathy, Thursday, 10/12/2022 @9$ :00 am with Dr. Claiborne Billings

## 2022-10-10 ENCOUNTER — Telehealth: Payer: Self-pay | Admitting: *Deleted

## 2022-10-10 DIAGNOSIS — I428 Other cardiomyopathies: Secondary | ICD-10-CM | POA: Insufficient documentation

## 2022-10-10 DIAGNOSIS — I429 Cardiomyopathy, unspecified: Secondary | ICD-10-CM | POA: Insufficient documentation

## 2022-10-10 DIAGNOSIS — I272 Pulmonary hypertension, unspecified: Secondary | ICD-10-CM | POA: Insufficient documentation

## 2022-10-10 DIAGNOSIS — R252 Cramp and spasm: Secondary | ICD-10-CM | POA: Insufficient documentation

## 2022-10-10 NOTE — H&P (View-Only) (Signed)
Cardiology Office Note  Date: 10/10/2022   ID: Raven Green, Raven Green 06-Dec-1955, MRN [id number]  PCP:  Celene Squibb, MD  Cardiologist:  Chalmers Guest, MD Electrophysiologist:  None   Reason for Office Visit: Follow-up of abnormal echo results   History of Present Illness: Raven Green is a 67 y.o. female known to have HTN, DM 2, IBS, splenic artery aneurysm s/p embolization in 2014 presented to cardiology clinic for abnormal echo results.  Patient was initially referred to cardiology clinic in 08/2022 for evaluation of substernal exertional chest heaviness x 2 months, lasting for few minutes and resolving with rest.  Associated with severe exertional fatigue and SOB.  She also had abdominal tightness that starts followed by chest heaviness.  She underwent EGD and colonoscopy for further evaluation of abdominal tightness in the past which were unremarkable.  She had ER visit on 09/13/2022 for evaluation of chest heaviness and DOE.  Due to imaging evidence of pulmonary edema, she was given IV Lasix with significant improvement in her symptoms.  She was discharged on Lasix 20 mg once daily. Her pulmonary artery was also enlarged to 3.7 cm on CT chest. High sensitive troponins were mildly elevated at 19 and 21.  Echocardiogram from 09/2022 showed new onset cardiomyopathy, LVEF 30 to 35%.  She is here for follow-up visit.  Patient is limiting her daily activities due to fear of chest heaviness, denies any DOE, leg swelling, palpitations but has severe cramps being in her left lower extremity both day and night time. Ongoing for quite some time.  Denies smoking cigarettes Past Medical History:  Diagnosis Date   Arthritis    Diabetes mellitus without complication ([employer])    gestational   Endometriosis    History of migraine headaches    Hypertension    IBS (irritable bowel syndrome)     Past Surgical History:  Procedure Laterality Date   BACK SURGERY     BIOPSY  04/20/2021   Procedure:  BIOPSY;  Surgeon: Montez Morita, Quillian Quince, MD;  [facility];  Service: Gastroenterology;;  small bowel, gastric   BREAST BIOPSY Left    Fibroadenoma   COLONOSCOPY WITH PROPOFOL N/A 04/20/2021   Procedure: COLONOSCOPY WITH PROPOFOL;  Surgeon: Harvel Quale, MD;  [facility];  Service: Gastroenterology;  Laterality: N/A;   EMBOLIZATION  08/13/2013   Procedure: EMBOLIZATION;  Surgeon: Conrad McKittrick, MD;  [facility] C[facility] LAB;  Service: Cardiovascular;;  splenic artery aneurysm   ESOPHAGOGASTRODUODENOSCOPY (EGD) WITH PROPOFOL N/A 04/20/2021   Procedure: ESOPHAGOGASTRODUODENOSCOPY (EGD) WITH PROPOFOL;  Surgeon: Harvel Quale, MD;  [facility];  Service: Gastroenterology;  Laterality: N/A;  9:15   POLYPECTOMY  04/20/2021   Procedure: POLYPECTOMY;  Surgeon: Harvel Quale, MD;  [facility];  Service: Gastroenterology;;   TONSILLECTOMY AND ADENOIDECTOMY     VISCERAL ANGIOGRAM N/A 08/13/2013   Procedure: VISCERAL ANGIOGRAM;  Surgeon: Conrad Naponee, MD;  Location: [facility] CATH LAB;  Service: Cardiovascular;  Laterality: N/A;    Current Outpatient Medications  Medication Sig Dispense Refill   furosemide (LASIX) 40 MG tablet Take 1 tablet (40 mg total) by mouth daily. 30 tablet 11   linaGLIPtin-metFORMIN HCl ER (JENTADUETO XR) 12-998 MG TB24 Take 1 tablet by mouth daily at 6 PM.     metoprolol tartrate (LOPRESSOR) 25 MG tablet Take 1 tablet (25 mg total) by mouth 2 (two) times daily. 180 tablet 3   nitroGLYCERIN (NITROSTAT) 0.4 MG  SL tablet Place 1 tablet (0.4 mg total) under the tongue every 5 (five) minutes as needed for chest pain. 25 tablet 3   potassium chloride SA (KLOR-CON M) 20 MEQ tablet Take 2 tablets (40 mEq total) by mouth 2 (two) times daily for 3 days, THEN 1 tablet (20 mEq total) 2 (two) times daily. (Patient taking differently: Take 1 tablet (20 mEq total) 2 (two) times daily.) 180 tablet 3   TOUJEO MAX SOLOSTAR  300 UNIT/ML Solostar Pen Inject 44 Units into the skin daily at 10 pm.     No current facility-administered medications for this visit.   Allergies:  Codeine and Sulfa antibiotics   Social History: The patient  reports that she has never smoked. She has never used smokeless tobacco. She reports that she does not currently use alcohol. She reports that she does not use drugs.   Family History: The patient's family history includes Breast cancer in her maternal grandmother; Cancer in her father, maternal grandfather, and maternal grandmother; Deep vein thrombosis in her mother; Diabetes in her father and sister; Gallstones in her maternal grandmother; Hyperlipidemia in her mother; Hypertension in her father and mother; Other in her mother; Peripheral vascular disease in her father; Varicose Veins in her mother.   ROS:  Please see the history of present illness. Otherwise, complete review of systems is positive for none.  All other systems are reviewed and negative.   Physical Exam: VS:  BP (!) 146/79   Pulse 83   Ht 5' 4.5" (1.638 m)   Wt 213 lb 3.2 oz (96.7 kg)   SpO2 97%   BMI 36.03 kg/m , BMI Body mass index is 36.03 kg/m.  Wt Readings from Last 3 Encounters:  10/09/22 213 lb 3.2 oz (96.7 kg)  09/15/22 224 lb 9.6 oz (101.9 kg)  09/13/22 221 lb (100.2 kg)    General: Patient appears comfortable at rest. HEENT: Conjunctiva and lids normal, oropharynx clear with moist mucosa. Neck: Supple, no elevated JVP or carotid bruits, no thyromegaly. Lungs: Clear to auscultation, nonlabored breathing at rest. Cardiac: Regular rate and rhythm, no S3 or significant systolic murmur, no pericardial rub. Abdomen: Soft, nontender, no hepatomegaly, bowel sounds present, no guarding or rebound. Extremities: No pitting edema, distal pulses 2+. Skin: Warm and dry. Musculoskeletal: No kyphosis. Neuropsychiatric: Alert and oriented x3, affect grossly appropriate.  Recent Labwork: 09/13/2022: ALT 30; AST  31; B Natriuretic Peptide 548.0; Hemoglobin 11.6; Platelets 263 09/26/2022: BUN 20; Creatinine, Ser 0.97; Magnesium 2.2; Potassium 4.6; Sodium 139  No results found for: "CHOL", "TRIG", "HDL", "CHOLHDL", "VLDL", "LDLCALC", "LDLDIRECT"  Other Studies Reviewed Today: Echocardiogram from 09/2022 LVEF 99991111 G2 DD RV systolic function is normal TR inadequate for assessing PA pressure  Assessment and Plan: Patient is a 67 year old F known to have HTN, DM 2, IBS, splenic artery aneurysm s/p embolization in 2014 presented to cardiology clinic for abnormal echo results.  # New onset cardiomyopathy LVEF 30 to 35% (echocardiogram in 2/24), currently compensated # Enlarged pulmonary trunk on CT chest, consistent with pulmonary HTN # Grade 2 diastolic dysfunction -Continue Lasix 40 mg once daily -Continue metoprolol tartrate 25 mg twice daily -Patient will benefit from invasive ischemia evaluation with LHC due to new onset cardiomyopathy, LVEF 30 to 35%. EKG showed Q waves in V1 to V3, NSR and no ST-T changes.  CT chest showed enlarged pulmonary trunk, 3.7 cm consistent with pulmonary HTN. She will benefit from [place] for further evaluation of pulmonary HTN. The risks and benefits  of cardiac catheterization have been discussed with the patient. These include bleeding, infection, kidney damage, stroke, heart attack, death. The patient understands these risks and is willing to proceed.  # HTN, controlled -Continue Lasix 40 mg once daily -Continue metoprolol tartrate 25 mg twice daily  # Left lower extremity cramps, rule out PAD -Obtain ABI with ultrasound arterial lower extremities  I have spent a total of 33 minutes with patient reviewing chart, EKGs, labs and examining patient as well as establishing an assessment and plan that was discussed with the patient.  > 50% of time was spent in direct patient care.    Medication Adjustments/Labs and Tests Ordered: Current medicines are reviewed at length with  the patient today.  Concerns regarding medicines are outlined above.   Tests Ordered: Orders Placed This Encounter  Procedures   EKG 12-Lead   VAS [place] LOWER EXT ART SEG MULTI (SEGMENTALS & LE RAYNAUDS)    Medication Changes: No orders of the defined types were placed in this encounter.   Disposition:  Follow up  1 month post [place], MD, 10/10/2022 3:41 PM    [facility] [place], [place], [place] [address]

## 2022-10-10 NOTE — Telephone Encounter (Signed)
Cardiac Catheterization scheduled at [facility] for: Thursday October 12, 2022 9 AM Arrival time and place: [facility] Main Entrance A at: 7 AM  Nothing to eat after midnight prior to procedure, clear liquids until 5 AM day of procedure.  Medication instructions: -Hold:  Lasix/KCl-AM of procedure   Insulin-1/2 usual dose HS prior to procedure   [name] of procedure and 48 hours post procedure -Other usual morning medications can be taken with sips of water including aspirin 81 mg.  Confirmed patient has responsible adult to drive home post procedure and be with patient first 24 hours after arriving home.  Patient reports no new symptoms concerning for COVID-19 in the past 10 days  Reviewed procedure instructions with patient.

## 2022-10-10 NOTE — Progress Notes (Signed)
Cardiology Office Note  Date: 10/10/2022   ID: Raven, Green April 29, 1956, MRN [id number]  PCP:  Celene Squibb, MD  Cardiologist:  Chalmers Guest, MD Electrophysiologist:  None   Reason for Office Visit: Follow-up of abnormal echo results   History of Present Illness: Raven Green is a 67 y.o. female known to have HTN, DM 2, IBS, splenic artery aneurysm s/p embolization in 2014 presented to cardiology clinic for abnormal echo results.  Patient was initially referred to cardiology clinic in 08/2022 for evaluation of substernal exertional chest heaviness x 2 months, lasting for few minutes and resolving with rest.  Associated with severe exertional fatigue and SOB.  She also had abdominal tightness that starts followed by chest heaviness.  She underwent EGD and colonoscopy for further evaluation of abdominal tightness in the past which were unremarkable.  She had ER visit on 09/13/2022 for evaluation of chest heaviness and DOE.  Due to imaging evidence of pulmonary edema, she was given IV Lasix with significant improvement in her symptoms.  She was discharged on Lasix 20 mg once daily. Her pulmonary artery was also enlarged to 3.7 cm on CT chest. High sensitive troponins were mildly elevated at 19 and 21.  Echocardiogram from 09/2022 showed new onset cardiomyopathy, LVEF 30 to 35%.  She is here for follow-up visit.  Patient is limiting her daily activities due to fear of chest heaviness, denies any DOE, leg swelling, palpitations but has severe cramps being in her left lower extremity both day and night time. Ongoing for quite some time.  Denies smoking cigarettes Past Medical History:  Diagnosis Date   Arthritis    Diabetes mellitus without complication ([place])    gestational   Endometriosis    History of migraine headaches    Hypertension    IBS (irritable bowel syndrome)     Past Surgical History:  Procedure Laterality Date   BACK SURGERY     BIOPSY  04/20/2021   Procedure:  BIOPSY;  Surgeon: Montez Morita, Quillian Quince, MD;  [facility];  Service: Gastroenterology;;  small bowel, gastric   BREAST BIOPSY Left    Fibroadenoma   COLONOSCOPY WITH PROPOFOL N/A 04/20/2021   Procedure: COLONOSCOPY WITH PROPOFOL;  Surgeon: Harvel Quale, MD;  [facility];  Service: Gastroenterology;  Laterality: N/A;   EMBOLIZATION  08/13/2013   Procedure: EMBOLIZATION;  Surgeon: Conrad Welcome, MD;  Location[facility] CATH LAB;  Service: Cardiovascular;;  splenic artery aneurysm   ESOPHAGOGASTRODUODENOSCOPY (EGD) WITH PROPOFOL N/A 04/20/2021   Procedure: ESOPHAGOGASTRODUODENOSCOPY (EGD) WITH PROPOFOL;  Surgeon: Harvel Quale, MD;  [facility];  Service: Gastroenterology;  Laterality: N/A;  9:15   POLYPECTOMY  04/20/2021   Procedure: POLYPECTOMY;  Surgeon: Harvel Quale, MD;  [facility];  Service: Gastroenterology;;   TONSILLECTOMY AND ADENOIDECTOMY     VISCERAL ANGIOGRAM N/A 08/13/2013   Procedure: VISCERAL ANGIOGRAM;  Surgeon: Conrad Galax, MD;  Location: [facility] CATH LAB;  Service: Cardiovascular;  Laterality: N/A;    Current Outpatient Medications  Medication Sig Dispense Refill   furosemide (LASIX) 40 MG tablet Take 1 tablet (40 mg total) by mouth daily. 30 tablet 11   linaGLIPtin-metFORMIN HCl ER (JENTADUETO XR) 12-998 MG TB24 Take 1 tablet by mouth daily at 6 PM.     metoprolol tartrate (LOPRESSOR) 25 MG tablet Take 1 tablet (25 mg total) by mouth 2 (two) times daily. 180 tablet 3   nitroGLYCERIN (NITROSTAT) 0.4 MG  SL tablet Place 1 tablet (0.4 mg total) under the tongue every 5 (five) minutes as needed for chest pain. 25 tablet 3   potassium chloride SA (KLOR-CON M) 20 MEQ tablet Take 2 tablets (40 mEq total) by mouth 2 (two) times daily for 3 days, THEN 1 tablet (20 mEq total) 2 (two) times daily. (Patient taking differently: Take 1 tablet (20 mEq total) 2 (two) times daily.) 180 tablet 3   TOUJEO MAX SOLOSTAR  300 UNIT/ML Solostar Pen Inject 44 Units into the skin daily at 10 pm.     No current facility-administered medications for this visit.   Allergies:  Codeine and Sulfa antibiotics   Social History: The patient  reports that she has never smoked. She has never used smokeless tobacco. She reports that she does not currently use alcohol. She reports that she does not use drugs.   Family History: The patient's family history includes Breast cancer in her maternal grandmother; Cancer in her father, maternal grandfather, and maternal grandmother; Deep vein thrombosis in her mother; Diabetes in her father and sister; Gallstones in her maternal grandmother; Hyperlipidemia in her mother; Hypertension in her father and mother; Other in her mother; Peripheral vascular disease in her father; Varicose Veins in her mother.   ROS:  Please see the history of present illness. Otherwise, complete review of systems is positive for none.  All other systems are reviewed and negative.   Physical Exam: VS:  BP (!) 146/79   Pulse 83   Ht 5' 4.5" (1.638 m)   Wt 213 lb 3.2 oz (96.7 kg)   SpO2 97%   BMI 36.03 kg/m , BMI Body mass index is 36.03 kg/m.  Wt Readings from Last 3 Encounters:  10/09/22 213 lb 3.2 oz (96.7 kg)  09/15/22 224 lb 9.6 oz (101.9 kg)  09/13/22 221 lb (100.2 kg)    General: Patient appears comfortable at rest. HEENT: Conjunctiva and lids normal, oropharynx clear with moist mucosa. Neck: Supple, no elevated JVP or carotid bruits, no thyromegaly. Lungs: Clear to auscultation, nonlabored breathing at rest. Cardiac: Regular rate and rhythm, no S3 or significant systolic murmur, no pericardial rub. Abdomen: Soft, nontender, no hepatomegaly, bowel sounds present, no guarding or rebound. Extremities: No pitting edema, distal pulses 2+. Skin: Warm and dry. Musculoskeletal: No kyphosis. Neuropsychiatric: Alert and oriented x3, affect grossly appropriate.  Recent Labwork: 09/13/2022: ALT 30; AST  31; B Natriuretic Peptide 548.0; Hemoglobin 11.6; Platelets 263 09/26/2022: BUN 20; Creatinine, Ser 0.97; Magnesium 2.2; Potassium 4.6; Sodium 139  No results found for: "CHOL", "TRIG", "HDL", "CHOLHDL", "VLDL", "LDLCALC", "LDLDIRECT"  Other Studies Reviewed Today: Echocardiogram from 09/2022 LVEF 99991111 G2 DD RV systolic function is normal TR inadequate for assessing PA pressure  Assessment and Plan: Patient is a 67 year old F known to have HTN, DM 2, IBS, splenic artery aneurysm s/p embolization in 2014 presented to cardiology clinic for abnormal echo results.  # New onset cardiomyopathy LVEF 30 to 35% (echocardiogram in 2/24), currently compensated # Enlarged pulmonary trunk on CT chest, consistent with pulmonary HTN # Grade 2 diastolic dysfunction -Continue Lasix 40 mg once daily -Continue metoprolol tartrate 25 mg twice daily -Patient will benefit from invasive ischemia evaluation with LHC due to new onset cardiomyopathy, LVEF 30 to 35%. EKG showed Q waves in V1 to V3, NSR and no ST-T changes.  CT chest showed enlarged pulmonary trunk, 3.7 cm consistent with pulmonary HTN. She will benefit from [place] for further evaluation of pulmonary HTN. The risks and benefits  of cardiac catheterization have been discussed with the patient. These include bleeding, infection, kidney damage, stroke, heart attack, death. The patient understands these risks and is willing to proceed.  # HTN, controlled -Continue Lasix 40 mg once daily -Continue metoprolol tartrate 25 mg twice daily  # Left lower extremity cramps, rule out PAD -Obtain ABI with ultrasound arterial lower extremities  I have spent a total of 33 minutes with patient reviewing chart, EKGs, labs and examining patient as well as establishing an assessment and plan that was discussed with the patient.  > 50% of time was spent in direct patient care.    Medication Adjustments/Labs and Tests Ordered: Current medicines are reviewed at length with  the patient today.  Concerns regarding medicines are outlined above.   Tests Ordered: Orders Placed This Encounter  Procedures   EKG 12-Lead   VAS [place] LOWER EXT ART SEG MULTI (SEGMENTALS & LE RAYNAUDS)    Medication Changes: No orders of the defined types were placed in this encounter.   Disposition:  Follow up  1 month post [place], MD, 10/10/2022 3:41 PM    [facility] [place], [place], [place] [id number]

## 2022-10-12 ENCOUNTER — Encounter (HOSPITAL_COMMUNITY): Payer: Self-pay | Admitting: Cardiovascular Disease

## 2022-10-12 ENCOUNTER — Ambulatory Visit (HOSPITAL_COMMUNITY)
Admission: RE | Admit: 2022-10-12 | Discharge: 2022-10-12 | Disposition: A | Discharge status: Home / Self Care | Payer: Medicare Other | Attending: Cardiovascular Disease | Admitting: Cardiovascular Disease

## 2022-10-12 ENCOUNTER — Encounter (HOSPITAL_COMMUNITY)
Admission: RE | Disposition: A | Discharge status: Home / Self Care | Payer: Self-pay | Source: Home / Self Care | Attending: Cardiovascular Disease

## 2022-10-12 ENCOUNTER — Other Ambulatory Visit: Payer: Self-pay

## 2022-10-12 DIAGNOSIS — Z79899 Other long term (current) drug therapy: Secondary | ICD-10-CM | POA: Diagnosis not present

## 2022-10-12 DIAGNOSIS — E119 Type 2 diabetes mellitus without complications: Secondary | ICD-10-CM | POA: Insufficient documentation

## 2022-10-12 DIAGNOSIS — Z7984 Long term (current) use of oral hypoglycemic drugs: Secondary | ICD-10-CM | POA: Insufficient documentation

## 2022-10-12 DIAGNOSIS — Z794 Long term (current) use of insulin: Secondary | ICD-10-CM | POA: Diagnosis not present

## 2022-10-12 DIAGNOSIS — I272 Pulmonary hypertension, unspecified: Secondary | ICD-10-CM | POA: Insufficient documentation

## 2022-10-12 DIAGNOSIS — I110 Hypertensive heart disease with heart failure: Secondary | ICD-10-CM | POA: Insufficient documentation

## 2022-10-12 DIAGNOSIS — K589 Irritable bowel syndrome without diarrhea: Secondary | ICD-10-CM | POA: Diagnosis not present

## 2022-10-12 DIAGNOSIS — I420 Dilated cardiomyopathy: Secondary | ICD-10-CM | POA: Insufficient documentation

## 2022-10-12 DIAGNOSIS — I509 Heart failure, unspecified: Secondary | ICD-10-CM | POA: Diagnosis not present

## 2022-10-12 DIAGNOSIS — I2510 Atherosclerotic heart disease of native coronary artery without angina pectoris: Secondary | ICD-10-CM | POA: Diagnosis not present

## 2022-10-12 HISTORY — PX: RIGHT/LEFT HEART CATH AND CORONARY ANGIOGRAPHY: CATH118266

## 2022-10-12 LAB — POCT I-STAT 7, (LYTES, BLD GAS, ICA,H+H)
Acid-Base Excess: 2 mmol/L (ref 0.0–2.0)
Bicarbonate: 27.7 mmol/L (ref 20.0–28.0)
Calcium, Ion: 1.22 mmol/L (ref 1.15–1.40)
HCT: 36 % (ref 36.0–46.0)
Hemoglobin: 12.2 g/dL (ref 12.0–15.0)
O2 Saturation: 95 %
Potassium: 3.7 mmol/L (ref 3.5–5.1)
Sodium: 143 mmol/L (ref 135–145)
TCO2: 29 mmol/L (ref 22–32)
pCO2 arterial: 45.4 mmHg (ref 32–48)
pH, Arterial: 7.394 (ref 7.35–7.45)
pO2, Arterial: 76 mmHg — ABNORMAL LOW (ref 83–108)

## 2022-10-12 LAB — POCT I-STAT EG7
Acid-Base Excess: 2 mmol/L (ref 0.0–2.0)
Acid-Base Excess: 2 mmol/L (ref 0.0–2.0)
Bicarbonate: 27.8 mmol/L (ref 20.0–28.0)
Bicarbonate: 28.4 mmol/L — ABNORMAL HIGH (ref 20.0–28.0)
Calcium, Ion: 1.2 mmol/L (ref 1.15–1.40)
Calcium, Ion: 1.22 mmol/L (ref 1.15–1.40)
HCT: 35 % — ABNORMAL LOW (ref 36.0–46.0)
HCT: 36 % (ref 36.0–46.0)
Hemoglobin: 11.9 g/dL — ABNORMAL LOW (ref 12.0–15.0)
Hemoglobin: 12.2 g/dL (ref 12.0–15.0)
O2 Saturation: 72 %
O2 Saturation: 72 %
Potassium: 3.7 mmol/L (ref 3.5–5.1)
Potassium: 3.7 mmol/L (ref 3.5–5.1)
Sodium: 143 mmol/L (ref 135–145)
Sodium: 143 mmol/L (ref 135–145)
TCO2: 29 mmol/L (ref 22–32)
TCO2: 30 mmol/L (ref 22–32)
pCO2, Ven: 48.2 mmHg (ref 44–60)
pCO2, Ven: 49.3 mmHg (ref 44–60)
pH, Ven: 7.369 (ref 7.25–7.43)
pH, Ven: 7.369 (ref 7.25–7.43)
pO2, Ven: 39 mmHg (ref 32–45)
pO2, Ven: 40 mmHg (ref 32–45)

## 2022-10-12 SURGERY — RIGHT/LEFT HEART CATH AND CORONARY ANGIOGRAPHY
Anesthesia: LOCAL

## 2022-10-12 MED ORDER — LIDOCAINE HCL (PF) 1 % IJ SOLN
INTRAMUSCULAR | Status: AC
  Filled 2022-10-12: qty 30

## 2022-10-12 MED ORDER — SODIUM CHLORIDE 0.9 % IV SOLN: 250.0000 mL | INTRAVENOUS | Status: DC | PRN

## 2022-10-12 MED ORDER — FENTANYL CITRATE (PF) 100 MCG/2ML IJ SOLN
INTRAMUSCULAR | Status: AC
  Filled 2022-10-12: qty 2

## 2022-10-12 MED ORDER — VERAPAMIL HCL 2.5 MG/ML IV SOLN
INTRAVENOUS | Status: AC
  Filled 2022-10-12: qty 2

## 2022-10-12 MED ORDER — HEPARIN SODIUM (PORCINE) 1000 UNIT/ML IJ SOLN
INTRAMUSCULAR | Status: DC | PRN
  Administered 2022-10-12: 5000 [IU] via INTRAVENOUS

## 2022-10-12 MED ORDER — SODIUM CHLORIDE 0.9 % IV SOLN: INTRAVENOUS | Status: DC

## 2022-10-12 MED ORDER — HYDRALAZINE HCL 20 MG/ML IJ SOLN: 10.0000 mg | INTRAMUSCULAR | Status: DC | PRN

## 2022-10-12 MED ORDER — MIDAZOLAM HCL 2 MG/2ML IJ SOLN
INTRAMUSCULAR | Status: DC | PRN
  Administered 2022-10-12: 2 mg via INTRAVENOUS

## 2022-10-12 MED ORDER — LIDOCAINE HCL (PF) 1 % IJ SOLN
INTRAMUSCULAR | Status: DC | PRN
  Administered 2022-10-12 (×3): 2 mL

## 2022-10-12 MED ORDER — HEPARIN SODIUM (PORCINE) 1000 UNIT/ML IJ SOLN
INTRAMUSCULAR | Status: AC
  Filled 2022-10-12: qty 10

## 2022-10-12 MED ORDER — HEPARIN (PORCINE) IN NACL 1000-0.9 UT/500ML-% IV SOLN
INTRAVENOUS | Status: DC | PRN
  Administered 2022-10-12 (×2): 500 mL

## 2022-10-12 MED ORDER — SODIUM CHLORIDE 0.9% FLUSH: 3.0000 mL | INTRAVENOUS | Status: DC | PRN

## 2022-10-12 MED ORDER — FENTANYL CITRATE (PF) 100 MCG/2ML IJ SOLN
INTRAMUSCULAR | Status: DC | PRN
  Administered 2022-10-12: 25 ug via INTRAVENOUS

## 2022-10-12 MED ORDER — MIDAZOLAM HCL 2 MG/2ML IJ SOLN
INTRAMUSCULAR | Status: AC
  Filled 2022-10-12: qty 2

## 2022-10-12 MED ORDER — ASPIRIN 81 MG PO CHEW: 81.0000 mg | CHEWABLE_TABLET | Freq: Every day | ORAL | Status: DC

## 2022-10-12 MED ORDER — ACETAMINOPHEN 325 MG PO TABS: 650.0000 mg | ORAL_TABLET | ORAL | Status: DC | PRN

## 2022-10-12 MED ORDER — LABETALOL HCL 5 MG/ML IV SOLN: 10.0000 mg | INTRAVENOUS | Status: DC | PRN

## 2022-10-12 MED ORDER — ASPIRIN 81 MG PO CHEW: 81.0000 mg | CHEWABLE_TABLET | ORAL | Status: DC

## 2022-10-12 MED ORDER — SODIUM CHLORIDE 0.9% FLUSH: 3.0000 mL | Freq: Two times a day (BID) | INTRAVENOUS | Status: DC

## 2022-10-12 MED ORDER — VERAPAMIL HCL 2.5 MG/ML IV SOLN
INTRAVENOUS | Status: DC | PRN
  Administered 2022-10-12: 10 mL via INTRA_ARTERIAL

## 2022-10-12 MED ORDER — DIAZEPAM 5 MG PO TABS: 5.0000 mg | ORAL_TABLET | Freq: Four times a day (QID) | ORAL | Status: DC | PRN

## 2022-10-12 MED ORDER — ONDANSETRON HCL 4 MG/2ML IJ SOLN: 4.0000 mg | Freq: Four times a day (QID) | INTRAMUSCULAR | Status: DC | PRN

## 2022-10-12 SURGICAL SUPPLY — 13 items
CATH BALLN WEDGE 5F 110CM (CATHETERS) IMPLANT
CATH OPTITORQUE TIG 4.0 5F (CATHETERS) IMPLANT
DEVICE RAD COMP TR BAND LRG (VASCULAR PRODUCTS) IMPLANT
GLIDESHEATH SLEND SS 6F .021 (SHEATH) IMPLANT
GUIDEWIRE INQWIRE 1.5J.035X260 (WIRE) IMPLANT
INQWIRE 1.5J .035X260CM (WIRE) ×1
KIT HEART LEFT (KITS) ×1 IMPLANT
PACK CARDIAC CATHETERIZATION (CUSTOM PROCEDURE TRAY) ×1 IMPLANT
SHEATH GLIDE SLENDER 4/5FR (SHEATH) IMPLANT
SHEATH PROBE COVER 6X72 (BAG) IMPLANT
TRANSDUCER W/STOPCOCK (MISCELLANEOUS) ×1 IMPLANT
TUBING CIL FLEX 10 FLL-RA (TUBING) ×1 IMPLANT
WIRE EMERALD 3MM-J .025X260CM (WIRE) IMPLANT

## 2022-10-12 NOTE — Discharge Instructions (Signed)

## 2022-10-12 NOTE — Progress Notes (Signed)
TR BAND REMOVAL  LOCATION:    right radial  DEFLATED PER PROTOCOL:    Yes.    TIME BAND OFF / DRESSING APPLIED:    1150 gauze dressing applied   SITE UPON ARRIVAL:    Level 0  SITE AFTER BAND REMOVAL:    Level 0  CIRCULATION SENSATION AND MOVEMENT:    Within Normal Limits   Yes.    COMMENTS:   no issues noted

## 2022-10-12 NOTE — Interval H&P Note (Signed)
Cath Lab Visit (complete for each Cath Lab visit)  Clinical Evaluation Leading to the Procedure:   ACS: No.  Non-ACS:    Anginal Classification: CCS II  Anti-ischemic medical therapy: Minimal Therapy (1 class of medications)  Non-Invasive Test Results: No non-invasive testing performed  Prior CABG: No previous CABG      History and Physical Interval Note:  10/12/2022 9:04 AM  Raven Green  has presented today for surgery, with the diagnosis of cardiomyopathy.  The various methods of treatment have been discussed with the patient and family. After consideration of risks, benefits and other options for treatment, the patient has consented to  Procedure(s): RIGHT/LEFT HEART CATH AND CORONARY ANGIOGRAPHY (N/A) as a surgical intervention.  The patient's history has been reviewed, patient examined, no change in status, stable for surgery.  I have reviewed the patient's chart and labs.  Questions were answered to the patient's satisfaction.     Shelva Majestic

## 2022-10-19 ENCOUNTER — Ambulatory Visit: Payer: Medicare Other | Admitting: Internal Medicine

## 2022-10-25 ENCOUNTER — Ambulatory Visit: Payer: Medicare Other | Attending: Internal Medicine

## 2022-10-25 DIAGNOSIS — R29898 Other symptoms and signs involving the musculoskeletal system: Secondary | ICD-10-CM

## 2022-11-01 ENCOUNTER — Ambulatory Visit: Payer: Medicare Other | Attending: Internal Medicine | Admitting: Internal Medicine

## 2022-11-01 ENCOUNTER — Encounter: Payer: Self-pay | Admitting: Internal Medicine

## 2022-11-01 ENCOUNTER — Ambulatory Visit: Payer: Medicare Other | Admitting: Internal Medicine

## 2022-11-01 VITALS — BP 138/72 | HR 80 | Ht 64.0 in | Wt 217.6 lb

## 2022-11-01 DIAGNOSIS — I429 Cardiomyopathy, unspecified: Secondary | ICD-10-CM | POA: Diagnosis not present

## 2022-11-01 DIAGNOSIS — I428 Other cardiomyopathies: Secondary | ICD-10-CM | POA: Diagnosis not present

## 2022-11-01 DIAGNOSIS — Z79899 Other long term (current) drug therapy: Secondary | ICD-10-CM | POA: Diagnosis not present

## 2022-11-01 MED ORDER — ENTRESTO 24-26 MG PO TABS: 1.0000 | ORAL_TABLET | Freq: Two times a day (BID) | ORAL | 6 refills | Status: DC

## 2022-11-01 MED ORDER — DAPAGLIFLOZIN PROPANEDIOL 10 MG PO TABS: 10.0000 mg | ORAL_TABLET | Freq: Every day | ORAL | 6 refills | Status: DC

## 2022-11-01 NOTE — Patient Instructions (Addendum)
Medication Instructions:  Your physician has recommended you make the following change in your medication:  Start entresto 24/26 mg twice daily Start farxiga 10 mg daily Continue other medications the same  Labwork: BMET in 5 days (11/06/2022) Non-fasting [facility] ([place]. [place])  Testing/Procedures: none  Follow-Up: Your physician recommends that you schedule a follow-up appointment in: 1 month  Any Other Special Instructions Will Be Listed Below (If Applicable). You have been referred to Cardiac Rehab  If you need a refill on your cardiac medications before your next appointment, please call your pharmacy.

## 2022-11-02 NOTE — Progress Notes (Addendum)
Cardiology Office Note  Date: 11/02/2022   ID: Raven, Green 12-Feb-1956, MRN [id number]  PCP:  Celene Squibb, MD  Cardiologist:  Chalmers Guest, MD Electrophysiologist:  None   Reason for Office Visit: Follow-up of abnormal echo results   History of Present Illness: Raven Green is a 67 y.o. female known to have NICM LVEF 30 to 35%, HTN, DM 2, IBS, splenic artery aneurysm s/p embolization in 2014 presented to cardiology clinic for follow-up visit.  Patient was initially referred to cardiology clinic in 08/2022 for evaluation of substernal exertional chest heaviness x 2 months, lasting for few minutes and resolving with rest.  Associated with severe exertional fatigue and SOB. She also had abdominal tightness that starts followed by chest heaviness.  She underwent EGD and colonoscopy for further evaluation of abdominal tightness in the past which were unremarkable.  She had ER visit on 09/13/2022 for evaluation of chest heaviness and DOE.  Due to imaging evidence of pulmonary edema, she was given IV Lasix with significant improvement in her symptoms.  She was discharged on Lasix 20 mg once daily. Her pulmonary artery was also enlarged to 3.7 cm on CT chest. High sensitive troponins were mildly elevated at 19 and 21.  Echocardiogram from 09/2022 showed new onset cardiomyopathy, LVEF 30 to 35%.  She underwent LHC in 09/2022 which showed nonobstructive CAD (10% proximal to mid LAD stenosis) and RHC showed mild pulmonary HTN. She is here for follow-up visit.  Denies any chest heaviness, denies any DOE, leg swelling, palpitations, dizziness, syncope.  She has severe muscle cramps and said it worsened after beginning Lasix. Denies smoking cigarettes.  Past Medical History:  Diagnosis Date   Arthritis    Diabetes mellitus without complication ([place])    gestational   Endometriosis    History of migraine headaches    Hypertension    IBS (irritable bowel syndrome)     Past Surgical  History:  Procedure Laterality Date   BACK SURGERY     BIOPSY  04/20/2021   Procedure: BIOPSY;  Surgeon: Montez Morita, Quillian Quince, MD;  Location: [facility];  Service: Gastroenterology;;  small bowel, gastric   BREAST BIOPSY Left    Fibroadenoma   COLONOSCOPY WITH PROPOFOL N/A 04/20/2021   Procedure: COLONOSCOPY WITH PROPOFOL;  Surgeon: Harvel Quale, MD;  Location: [facility];  Service: Gastroenterology;  Laterality: N/A;   EMBOLIZATION  08/13/2013   Procedure: EMBOLIZATION;  Surgeon: Conrad Dundee, MD;  Location: [facility] CATH LAB;  Service: Cardiovascular;;  splenic artery aneurysm   ESOPHAGOGASTRODUODENOSCOPY (EGD) WITH PROPOFOL N/A 04/20/2021   Procedure: ESOPHAGOGASTRODUODENOSCOPY (EGD) WITH PROPOFOL;  Surgeon: Harvel Quale, MD;  Location: [facility];  Service: Gastroenterology;  Laterality: N/A;  9:15   POLYPECTOMY  04/20/2021   Procedure: POLYPECTOMY;  Surgeon: Harvel Quale, MD;  Location: [facility];  Service: Gastroenterology;;   RIGHT/LEFT HEART CATH AND CORONARY ANGIOGRAPHY N/A 10/12/2022   Procedure: RIGHT/LEFT HEART CATH AND CORONARY ANGIOGRAPHY;  Surgeon: Troy Sine, MD;  Location: [facility];  Service: Cardiovascular;  Laterality: N/A;   TONSILLECTOMY AND ADENOIDECTOMY     VISCERAL ANGIOGRAM N/A 08/13/2013   Procedure: VISCERAL ANGIOGRAM;  Surgeon: Conrad Howe, MD;  Location: [facility] CATH LAB;  Service: Cardiovascular;  Laterality: N/A;    Current Outpatient Medications  Medication Sig Dispense Refill   dapagliflozin propanediol (FARXIGA) 10 MG TABS tablet Take 1 tablet (10 mg total) by mouth daily before breakfast. 30  tablet 6   furosemide (LASIX) 40 MG tablet Take 1 tablet (40 mg total) by mouth daily. 30 tablet 11   linaGLIPtin-metFORMIN HCl ER (JENTADUETO XR) 12-998 MG TB24 Take 1 tablet by mouth daily at 6 PM.     metoprolol tartrate (LOPRESSOR) 25 MG tablet Take 1 tablet (25 mg total) by mouth 2 (two) times daily. 180  tablet 3   nitroGLYCERIN (NITROSTAT) 0.4 MG SL tablet Place 1 tablet (0.4 mg total) under the tongue every 5 (five) minutes as needed for chest pain. 25 tablet 3   potassium chloride SA (KLOR-CON M) 20 MEQ tablet Take 2 tablets (40 mEq total) by mouth 2 (two) times daily for 3 days, THEN 1 tablet (20 mEq total) 2 (two) times daily. (Patient taking differently: Take 1 tablet (20 mEq total) 2 (two) times daily.) 180 tablet 3   sacubitril-valsartan (ENTRESTO) 24-26 MG Take 1 tablet by mouth 2 (two) times daily. 60 tablet 6   TOUJEO MAX SOLOSTAR 300 UNIT/ML Solostar Pen Inject 44 Units into the skin daily at 10 pm.     No current facility-administered medications for this visit.   Allergies:  Codeine and Sulfa antibiotics   Social History: The patient  reports that she has never smoked. She has never used smokeless tobacco. She reports that she does not currently use alcohol. She reports that she does not use drugs.   Family History: The patient's family history includes Breast cancer in her maternal grandmother; Cancer in her father, maternal grandfather, and maternal grandmother; Deep vein thrombosis in her mother; Diabetes in her father and sister; Gallstones in her maternal grandmother; Hyperlipidemia in her mother; Hypertension in her father and mother; Other in her mother; Peripheral vascular disease in her father; Varicose Veins in her mother.   ROS:  Please see the history of present illness. Otherwise, complete review of systems is positive for none.  All other systems are reviewed and negative.   Physical Exam: VS:  BP 138/72   Pulse 80   Ht '5\' 4"'$  (1.626 m)   Wt 217 lb 9.6 oz (98.7 kg)   SpO2 96%   BMI 37.35 kg/m , BMI Body mass index is 37.35 kg/m.  Wt Readings from Last 3 Encounters:  11/01/22 217 lb 9.6 oz (98.7 kg)  10/12/22 213 lb (96.6 kg)  10/09/22 213 lb 3.2 oz (96.7 kg)    General: Patient appears comfortable at rest. HEENT: Conjunctiva and lids normal, oropharynx clear  with moist mucosa. Neck: Supple, no elevated JVP or carotid bruits, no thyromegaly. Lungs: Clear to auscultation, nonlabored breathing at rest. Cardiac: Regular rate and rhythm, no S3 or significant systolic murmur, no pericardial rub. Abdomen: Soft, nontender, no hepatomegaly, bowel sounds present, no guarding or rebound. Extremities: No pitting edema, distal pulses 2+. Skin: Warm and dry. Musculoskeletal: No kyphosis. Neuropsychiatric: Alert and oriented x3, affect grossly appropriate.  Recent Labwork: 09/13/2022: ALT 30; AST 31; B Natriuretic Peptide 548.0; Platelets 263 09/26/2022: BUN 20; Creatinine, Ser 0.97; Magnesium 2.2 10/12/2022: Hemoglobin 12.2; Potassium 3.7; Sodium 143  No results found for: "CHOL", "TRIG", "HDL", "CHOLHDL", "VLDL", "LDLCALC", "LDLDIRECT"  Other Studies Reviewed Today: RHC/LHC in 09/2022 10% LAD stenosis Nonobstructive CAD Mild pulmonary hypertension  Echocardiogram from 09/2022 LVEF 99991111 G2 DD RV systolic function is normal TR inadequate for assessing PA pressure  Assessment and Plan: Patient is a 67 year old F known to have NICM LVEF 30-35%, HTN, DM 2, IBS, splenic artery aneurysm s/p embolization in 2014 presented to cardiology clinic for follow-up  visit.  # NICM LVEF 30 to 35% (diagnosed in 07/2022) # Chronic heart failure -Patient underwent LHC on 10/12/2022 for new onset cardiomyopathy, LHC showed nonobstructive CAD (proximal LAD to mid LAD 10% stenosis) and RHC showed mild pulmonary hypertension. -Continue Lasix 40 mg once daily (instructed patient to hold Lasix for 1 day and evaluate her symptoms of muscle cramps) -Continue metoprolol tartrate 25 mg twice daily -Start Entresto 24-26 mg twice daily -Start Farxiga 10 mg once daily -Obtain BMP in 5 days -Ambulatory referral to cardiac rehabilitation for chronic heart failure and mild pulmonary HTN  # Mild pulmonary HTN -Continue above management  # HTN, controlled -As stated above  # Left  lower extremity cramps, rule out PAD -PAD is ruled out, instructed patient to hold Lasix for 1 day and evaluate if the symptoms of muscle cramps resolve.  I have spent a total of 33 minutes with patient reviewing chart, EKGs, labs and examining patient as well as establishing an assessment and plan that was discussed with the patient.  > 50% of time was spent in direct patient care.    Medication Adjustments/Labs and Tests Ordered: Current medicines are reviewed at length with the patient today.  Concerns regarding medicines are outlined above.   Tests Ordered: Orders Placed This Encounter  Procedures   Basic metabolic panel   AMB referral to cardiac rehabilitation    Medication Changes: Meds ordered this encounter  Medications   sacubitril-valsartan (ENTRESTO) 24-26 MG    Sig: Take 1 tablet by mouth 2 (two) times daily.    Dispense:  60 tablet    Refill:  6    11/01/2022 NEW   dapagliflozin propanediol (FARXIGA) 10 MG TABS tablet    Sig: Take 1 tablet (10 mg total) by mouth daily before breakfast.    Dispense:  30 tablet    Refill:  6    11/01/2022 NEW    Disposition:  Follow up 1 month  Signed Timon Geissinger Fidel Levy, MD, 11/02/2022 11:49 AM    [facility], [place], [place] [address]

## 2022-11-03 ENCOUNTER — Ambulatory Visit: Payer: Medicare Other | Admitting: Internal Medicine

## 2022-11-06 DIAGNOSIS — I429 Cardiomyopathy, unspecified: Secondary | ICD-10-CM | POA: Diagnosis not present

## 2022-11-06 DIAGNOSIS — Z79899 Other long term (current) drug therapy: Secondary | ICD-10-CM | POA: Diagnosis not present

## 2022-11-07 LAB — BASIC METABOLIC PANEL
BUN/Creatinine Ratio: 18 (ref 12–28)
BUN: 17 mg/dL (ref 8–27)
CO2: 23 mmol/L (ref 20–29)
Calcium: 9.1 mg/dL (ref 8.7–10.3)
Chloride: 107 mmol/L — ABNORMAL HIGH (ref 96–106)
Creatinine, Ser: 0.93 mg/dL (ref 0.57–1.00)
Glucose: 139 mg/dL — ABNORMAL HIGH (ref 70–99)
Potassium: 4.9 mmol/L (ref 3.5–5.2)
Sodium: 145 mmol/L — ABNORMAL HIGH (ref 134–144)
eGFR: 68 mL/min/{1.73_m2} (ref >59)

## 2022-11-10 ENCOUNTER — Telehealth: Payer: Self-pay | Admitting: Internal Medicine

## 2022-11-10 NOTE — Telephone Encounter (Signed)
Patient would like to get recent blood work test results.

## 2022-11-13 NOTE — Telephone Encounter (Signed)
Patient is calling back in regards to lab results and requesting an update. Please advise.

## 2022-11-13 NOTE — Telephone Encounter (Signed)
-----   Message from Chalmers Guest, MD sent at 11/12/2022  9:08 AM EDT ----- Normal serum creatinine. Continue current medications.

## 2022-11-13 NOTE — Telephone Encounter (Signed)
Patient informed. Copy sent to PCP °

## 2022-11-16 ENCOUNTER — Encounter (HOSPITAL_COMMUNITY)
Admission: RE | Admit: 2022-11-16 | Discharge: 2022-11-16 | Disposition: A | Discharge status: Home / Self Care | Payer: Medicare Other | Source: Ambulatory Visit | Attending: Internal Medicine | Admitting: Internal Medicine

## 2022-11-16 VITALS — Ht 64.0 in | Wt 220.5 lb

## 2022-11-16 DIAGNOSIS — I5022 Chronic systolic (congestive) heart failure: Secondary | ICD-10-CM | POA: Diagnosis not present

## 2022-11-16 LAB — GLUCOSE, CAPILLARY: Glucose-Capillary: 155 mg/dL — ABNORMAL HIGH (ref 70–99)

## 2022-11-16 NOTE — Progress Notes (Signed)
Cardiac Individual Treatment Plan  Patient Details  Name: Raven Green MRN: [id number] Date of Birth: July 25, 1956 Referring Provider:   Flowsheet Row CARDIAC REHAB PHASE II ORIENTATION from 11/16/2022 in [place]  Referring Provider Dr. Dellia Cloud       Initial Encounter Date:  Flowsheet Row CARDIAC REHAB PHASE II ORIENTATION from 11/16/2022 in [place]  Date 11/16/22       Visit Diagnosis: Heart failure, chronic systolic (HCC)  Patient's Home Medications on Admission:  Current Outpatient Medications:    dapagliflozin propanediol (FARXIGA) 10 MG TABS tablet, Take 1 tablet (10 mg total) by mouth daily before breakfast., Disp: 30 tablet, Rfl: 6   furosemide (LASIX) 40 MG tablet, Take 1 tablet (40 mg total) by mouth daily., Disp: 30 tablet, Rfl: 11   linaGLIPtin-metFORMIN HCl ER (JENTADUETO XR) 12-998 MG TB24, Take 1 tablet by mouth daily at 6 PM., Disp: , Rfl:    metoprolol tartrate (LOPRESSOR) 25 MG tablet, Take 1 tablet (25 mg total) by mouth 2 (two) times daily., Disp: 180 tablet, Rfl: 3   nitroGLYCERIN (NITROSTAT) 0.4 MG SL tablet, Place 1 tablet (0.4 mg total) under the tongue every 5 (five) minutes as needed for chest pain., Disp: 25 tablet, Rfl: 3   potassium chloride SA (KLOR-CON M) 20 MEQ tablet, Take 2 tablets (40 mEq total) by mouth 2 (two) times daily for 3 days, THEN 1 tablet (20 mEq total) 2 (two) times daily. (Patient taking differently: Take 1 tablet (20 mEq total) 2 (two) times daily.), Disp: 180 tablet, Rfl: 3   sacubitril-valsartan (ENTRESTO) 24-26 MG, Take 1 tablet by mouth 2 (two) times daily., Disp: 60 tablet, Rfl: 6   TOUJEO MAX SOLOSTAR 300 UNIT/ML Solostar Pen, Inject 44 Units into the skin daily at 10 pm., Disp: , Rfl:   Past Medical History: Past Medical History:  Diagnosis Date   Arthritis    Diabetes mellitus without complication ([place])    gestational   Endometriosis    History of migraine headaches     Hypertension    IBS (irritable bowel syndrome)     Tobacco Use: Social History   Tobacco Use  Smoking Status Never  Smokeless Tobacco Never    Labs: Review Flowsheet       Latest Ref Rng & Units 08/13/2013 10/12/2022  Labs for ITP Cardiac and Pulmonary Rehab  PH, Arterial 7.35 - 7.45 - 7.394   PCO2 arterial 32 - 48 mmHg - 45.4   Bicarbonate 20.0 - 28.0 mmol/L - 27.7  28.4  27.8   TCO2 22 - 32 mmol/L 28  29  30  29    O2 Saturation % - 95  72  72     Capillary Blood Glucose: Lab Results  Component Value Date   GLUCAP 155 (H) 11/16/2022   GLUCAP 147 (H) 04/20/2021   GLUCAP 136 (H) 08/13/2013     Exercise Target Goals: Exercise Program Goal: Individual exercise prescription set using results from initial 6 min walk test and THRR while considering  patient's activity barriers and safety.   Exercise Prescription Goal: Starting with aerobic activity 30 plus minutes a day, 3 days per week for initial exercise prescription. Provide home exercise prescription and guidelines that participant acknowledges understanding prior to discharge.  Activity Barriers & Risk Stratification:  Activity Barriers & Cardiac Risk Stratification - 11/16/22 1323       Activity Barriers & Cardiac Risk Stratification   Activity Barriers Arthritis;Back Problems;Neck/Spine Problems;Joint Problems;Deconditioning;Decreased Ventricular Function  Cardiac Risk Stratification High             6 Minute Walk:  6 Minute Walk     Row Name 11/16/22 1316         6 Minute Walk   Phase Initial     Distance 1100 feet     Walk Time 6 minutes     # of Rest Breaks 0     MPH 2.08     METS 2.54     RPE 12     VO2 Peak 8.89     Symptoms No     Resting HR 75 bpm     Resting BP 112/60     Resting Oxygen Saturation  95 %     Exercise Oxygen Saturation  during 6 min walk 96 %     Max Ex. HR 98 bpm     Max Ex. BP 140/60     2 Minute Post BP 120/60              Oxygen Initial  Assessment:   Oxygen Re-Evaluation:   Oxygen Discharge (Final Oxygen Re-Evaluation):   Initial Exercise Prescription:  Initial Exercise Prescription - 11/16/22 1300       Date of Initial Exercise RX and Referring Provider   Date 11/16/22    Referring Provider Dr. Dellia Cloud    Expected Discharge Date 02/16/23      NuStep   Level 1    SPM 80    Minutes 39      Prescription Details   Frequency (times per week) 3    Duration Progress to 30 minutes of continuous aerobic without signs/symptoms of physical distress      Intensity   THRR 40-80% of Max Heartrate 63-123    Ratings of Perceived Exertion 11-13      Resistance Training   Training Prescription Yes    Weight 0    Reps 10-15             Perform Capillary Blood Glucose checks as needed.  Exercise Prescription Changes:   Exercise Comments:   Exercise Goals and Review:   Exercise Goals     Row Name 11/16/22 1319             Exercise Goals   Increase Physical Activity Yes       Intervention Provide advice, education, support and counseling about physical activity/exercise needs.;Develop an individualized exercise prescription for aerobic and resistive training based on initial evaluation findings, risk stratification, comorbidities and participant's personal goals.       Expected Outcomes Short Term: Attend rehab on a regular basis to increase amount of physical activity.;Long Term: Add in home exercise to make exercise part of routine and to increase amount of physical activity.;Long Term: Exercising regularly at least 3-5 days a week.       Increase Strength and Stamina Yes       Intervention Provide advice, education, support and counseling about physical activity/exercise needs.;Develop an individualized exercise prescription for aerobic and resistive training based on initial evaluation findings, risk stratification, comorbidities and participant's personal goals.       Expected Outcomes Short  Term: Increase workloads from initial exercise prescription for resistance, speed, and METs.;Short Term: Perform resistance training exercises routinely during rehab and add in resistance training at home;Long Term: Improve cardiorespiratory fitness, muscular endurance and strength as measured by increased METs and functional capacity (6MWT)       Able to understand and use rate of  perceived exertion (RPE) scale Yes       Intervention Provide education and explanation on how to use RPE scale       Expected Outcomes Short Term: Able to use RPE daily in rehab to express subjective intensity level;Long Term:  Able to use RPE to guide intensity level when exercising independently       Knowledge and understanding of Target Heart Rate Range (THRR) Yes       Intervention Provide education and explanation of THRR including how the numbers were predicted and where they are located for reference       Expected Outcomes Short Term: Able to state/look up THRR;Short Term: Able to use daily as guideline for intensity in rehab;Long Term: Able to use THRR to govern intensity when exercising independently       Able to check pulse independently Yes       Intervention Provide education and demonstration on how to check pulse in carotid and radial arteries.;Review the importance of being able to check your own pulse for safety during independent exercise       Expected Outcomes Short Term: Able to explain why pulse checking is important during independent exercise;Long Term: Able to check pulse independently and accurately       Understanding of Exercise Prescription Yes       Intervention Provide education, explanation, and written materials on patient's individual exercise prescription       Expected Outcomes Short Term: Able to explain program exercise prescription;Long Term: Able to explain home exercise prescription to exercise independently                Exercise Goals Re-Evaluation :    Discharge  Exercise Prescription (Final Exercise Prescription Changes):   Nutrition:  Target Goals: Understanding of nutrition guidelines, daily intake of sodium 1500mg , cholesterol 200mg , calories 30% from fat and 7% or less from saturated fats, daily to have 5 or more servings of fruits and vegetables.  Biometrics:  Pre Biometrics - 11/16/22 1406       Pre Biometrics   Height 5\' 4"  (1.626 m)    Weight 100 kg    BMI (Calculated) 37.82              Nutrition Therapy Plan and Nutrition Goals:  Nutrition Therapy & Goals - 11/16/22 1330       Personal Nutrition Goals   Comments Pt is on regular diet but is trying to limit her sugar and salt intake.      Intervention Plan   Intervention Nutrition handout(s) given to patient.    Expected Outcomes Short Term Goal: Understand basic principles of dietary content, such as calories, fat, sodium, cholesterol and nutrients.             Nutrition Assessments:  Nutrition Assessments - 11/16/22 1330       MEDFICTS Scores   Pre Score 56            MEDIFICTS Score Key: ?70 Need to make dietary changes  40-70 Heart Healthy Diet ? 40 Therapeutic Level Cholesterol Diet   Picture Your Plate Scores: D34-534 Unhealthy dietary pattern with much room for improvement. 41-50 Dietary pattern unlikely to meet recommendations for good health and room for improvement. 51-60 More healthful dietary pattern, with some room for improvement.  >60 Healthy dietary pattern, although there may be some specific behaviors that could be improved.    Nutrition Goals Re-Evaluation:   Nutrition Goals Discharge (Final Nutrition Goals Re-Evaluation):   Psychosocial:  Target Goals: Acknowledge presence or absence of significant depression and/or stress, maximize coping skills, provide positive support system. Participant is able to verbalize types and ability to use techniques and skills needed for reducing stress and depression.  Initial Review &  Psychosocial Screening:  Initial Psych Review & Screening - 11/16/22 1328       Initial Review   Current issues with None Identified      Family Dynamics   Good Support System? Yes    Comments pt's husband      Barriers   Psychosocial barriers to participate in program There are no identifiable barriers or psychosocial needs.      Screening Interventions   Interventions Encouraged to exercise;Provide feedback about the scores to participant;Other (comment)    Expected Outcomes Short Term goal: Identification and review with participant of any Quality of Life or Depression concerns found by scoring the questionnaire.;Long Term goal: The participant improves quality of Life and PHQ9 Scores as seen by post scores and/or verbalization of changes             Quality of Life Scores:  Quality of Life - 11/16/22 1333       Quality of Life   Select Quality of Life      Quality of Life Scores   Health/Function Pre 14.5 %    Socioeconomic Pre 30 %    Psych/Spiritual Pre 19.71 %    Family Pre 21.6 %    GLOBAL Pre 19.5 %            Scores of 19 and below usually indicate a poorer quality of life in these areas.  A difference of  2-3 points is a clinically meaningful difference.  A difference of 2-3 points in the total score of the Quality of Life Index has been associated with significant improvement in overall quality of life, self-image, physical symptoms, and general health in studies assessing change in quality of life.  PHQ-9: Review Flowsheet       11/16/2022  Depression screen PHQ 2/9  Decreased Interest 1  Down, Depressed, Hopeless 0  PHQ - 2 Score 1  Altered sleeping 1  Tired, decreased energy 3  Change in appetite 2  Feeling bad or failure about yourself  0  Trouble concentrating 0  Moving slowly or fidgety/restless 0  Suicidal thoughts 0  PHQ-9 Score 7  Difficult doing work/chores Somewhat difficult   Interpretation of Total Score  Total Score Depression  Severity:  1-4 = Minimal depression, 5-9 = Mild depression, 10-14 = Moderate depression, 15-19 = Moderately severe depression, 20-27 = Severe depression   Psychosocial Evaluation and Intervention:  Psychosocial Evaluation - 11/16/22 1345       Psychosocial Evaluation & Interventions   Interventions Stress management education;Relaxation education;Encouraged to exercise with the program and follow exercise prescription    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  She scored a 7 on her PHQ-9, which relates to her fatigue, overeating, and trouble sleeping when she takes Lasix.  She is currently taking Lasix every few days, since the medication causes muscle aches in her legs and buttocks.  Her goals are to lose weight, increase her strength and endurance, and get back to "feeling normal."  We will continue to monitor her progress toward meeting these goals.    Expected Outcomes The pt will continue to have no identifiable psychosocial issues.    Continue Psychosocial Services  No Follow up required  Psychosocial Re-Evaluation:   Psychosocial Discharge (Final Psychosocial Re-Evaluation):   Vocational Rehabilitation: Provide vocational rehab assistance to qualifying candidates.   Vocational Rehab Evaluation & Intervention:  Vocational Rehab - 11/16/22 1251       Initial Vocational Rehab Evaluation & Intervention   Assessment shows need for Vocational Rehabilitation No      Vocational Rehab Re-Evaulation   Comments retired             Education: Education Goals: Education classes will be provided on a weekly basis, covering required topics. Participant will state understanding/return demonstration of topics presented.  Learning Barriers/Preferences:  Learning Barriers/Preferences - 11/16/22 1332       Learning Barriers/Preferences   Learning Barriers None    Learning Preferences Group Instruction             Education Topics: Hypertension, Hypertension  Reduction -Define heart disease and high blood pressure. Discus how high blood pressure affects the body and ways to reduce high blood pressure.   Exercise and Your Heart -Discuss why it is important to exercise, the FITT principles of exercise, normal and abnormal responses to exercise, and how to exercise safely.   Angina -Discuss definition of angina, causes of angina, treatment of angina, and how to decrease risk of having angina.   Cardiac Medications -Review what the following cardiac medications are used for, how they affect the body, and side effects that may occur when taking the medications.  Medications include Aspirin, Beta blockers, calcium channel blockers, ACE Inhibitors, angiotensin receptor blockers, diuretics, digoxin, and antihyperlipidemics.   Congestive Heart Failure -Discuss the definition of CHF, how to live with CHF, the signs and symptoms of CHF, and how keep track of weight and sodium intake.   Heart Disease and Intimacy -Discus the effect sexual activity has on the heart, how changes occur during intimacy as we age, and safety during sexual activity.   Smoking Cessation / COPD -Discuss different methods to quit smoking, the health benefits of quitting smoking, and the definition of COPD.   Nutrition I: Fats -Discuss the types of cholesterol, what cholesterol does to the heart, and how cholesterol levels can be controlled.   Nutrition II: Labels -Discuss the different components of food labels and how to read food label   Heart Parts/Heart Disease and PAD -Discuss the anatomy of the heart, the pathway of blood circulation through the heart, and these are affected by heart disease.   Stress I: Signs and Symptoms -Discuss the causes of stress, how stress may lead to anxiety and depression, and ways to limit stress.   Stress II: Relaxation -Discuss different types of relaxation techniques to limit stress.   Warning Signs of Stroke / TIA -Discuss  definition of a stroke, what the signs and symptoms are of a stroke, and how to identify when someone is having stroke.   Knowledge Questionnaire Score:  Knowledge Questionnaire Score - 11/16/22 1333       Knowledge Questionnaire Score   Pre Score 21/24             Core Components/Risk Factors/Patient Goals at Admission:  Personal Goals and Risk Factors at Admission - 11/16/22 1336       Core Components/Risk Factors/Patient Goals on Admission    Weight Management Weight Loss;Yes    Intervention Weight Management: Develop a combined nutrition and exercise program designed to reach desired caloric intake, while maintaining appropriate intake of nutrient and fiber, sodium and fats, and appropriate energy expenditure required for the weight goal.;Weight  Management: Provide education and appropriate resources to help participant work on and attain dietary goals.;Weight Management/Obesity: Establish reasonable short term and long term weight goals.;Obesity: Provide education and appropriate resources to help participant work on and attain dietary goals.    Expected Outcomes Understanding of distribution of calorie intake throughout the day with the consumption of 4-5 meals/snacks;Understanding recommendations for meals to include 15-35% energy as protein, 25-35% energy from fat, 35-60% energy from carbohydrates, less than 200mg  of dietary cholesterol, 20-35 gm of total fiber daily;Weight Loss: Understanding of general recommendations for a balanced deficit meal plan, which promotes 1-2 lb weight loss per week and includes a negative energy balance of [id number] kcal/d;Weight Maintenance: Understanding of the daily nutrition guidelines, which includes 25-35% calories from fat, 7% or less cal from saturated fats, less than 200mg  cholesterol, less than 1.5gm of sodium, & 5 or more servings of fruits and vegetables daily;Long Term: Adherence to nutrition and physical activity/exercise program aimed toward  attainment of established weight goal;Short Term: Continue to assess and modify interventions until short term weight is achieved    Improve shortness of breath with ADL's Yes    Intervention Provide education, individualized exercise plan and daily activity instruction to help decrease symptoms of SOB with activities of daily living.    Expected Outcomes Short Term: Improve cardiorespiratory fitness to achieve a reduction of symptoms when performing ADLs;Long Term: Be able to perform more ADLs without symptoms or delay the onset of symptoms    Diabetes Yes    Intervention Provide education about signs/symptoms and action to take for hypo/hyperglycemia.;Provide education about proper nutrition, including hydration, and aerobic/resistive exercise prescription along with prescribed medications to achieve blood glucose in normal ranges: Fasting glucose 65-99 mg/dL    Expected Outcomes Short Term: Participant verbalizes understanding of the signs/symptoms and immediate care of hyper/hypoglycemia, proper foot care and importance of medication, aerobic/resistive exercise and nutrition plan for blood glucose control.;Long Term: Attainment of HbA1C < 7%.    Heart Failure Yes    Intervention Provide a combined exercise and nutrition program that is supplemented with education, support and counseling about heart failure. Directed toward relieving symptoms such as shortness of breath, decreased exercise tolerance, and extremity edema.    Expected Outcomes Improve functional capacity of life;Short term: Attendance in program 2-3 days a week with increased exercise capacity. Reported lower sodium intake. Reported increased fruit and vegetable intake. Reports medication compliance.;Short term: Daily weights obtained and reported for increase. Utilizing diuretic protocols set by physician.;Long term: Adoption of self-care skills and reduction of barriers for early signs and symptoms recognition and intervention leading to  self-care maintenance.    Hypertension Yes    Intervention Provide education on lifestyle modifcations including regular physical activity/exercise, weight management, moderate sodium restriction and increased consumption of fresh fruit, vegetables, and low fat dairy, alcohol moderation, and smoking cessation.;Monitor prescription use compliance.    Expected Outcomes Short Term: Continued assessment and intervention until BP is < 140/37mm HG in hypertensive participants. < 130/28mm HG in hypertensive participants with diabetes, heart failure or chronic kidney disease.;Long Term: Maintenance of blood pressure at goal levels.    Personal Goal Other Yes    Personal Goal Get to feeling "back to normal", increase strength and endurance.    Intervention Pt will attend [facility] program 3 days a week and begin a home exercise program.    Expected Outcomes Pt will meet both her personal and program goals.  Core Components/Risk Factors/Patient Goals Review:    Core Components/Risk Factors/Patient Goals at Discharge (Final Review):    ITP Comments:   Comments: Patient arrived for 1st visit/orientation/education at 12:30. Patient was referred to CR by Dr. Dellia Cloud due to chronic systolic heart failure ([id number]). During orientation advised patient on arrival and appointment times what to wear, what to do before, during and after exercise. Reviewed attendance and class policy.  Pt is scheduled to return Cardiac Rehab on 11/20/22 at 9:30. Pt was advised to come to class 15 minutes before class starts.  Discussed RPE/Dpysnea scales. Patient participated in warm up stretches. Patient was able to complete 6 minute walk test.  Telemetry: NSR.  Patient was measured for the equipment. Discussed equipment safety with patient. Took patient pre-anthropometric measurements. Patient finished visit at 14:40.

## 2022-11-20 ENCOUNTER — Ambulatory Visit: Payer: Medicare Other | Admitting: Internal Medicine

## 2022-11-20 ENCOUNTER — Encounter (HOSPITAL_COMMUNITY)
Admission: RE | Admit: 2022-11-20 | Discharge: 2022-11-20 | Disposition: A | Discharge status: Home / Self Care | Payer: Medicare Other | Source: Ambulatory Visit | Attending: Internal Medicine | Admitting: Internal Medicine

## 2022-11-20 VITALS — Wt 218.3 lb

## 2022-11-20 DIAGNOSIS — I5022 Chronic systolic (congestive) heart failure: Secondary | ICD-10-CM

## 2022-11-20 NOTE — Progress Notes (Signed)
Daily Session Note  Patient Details  Name: Raven Green MRN: [id number] Date of Birth: 1956/06/07 Referring Provider:   Flowsheet Row CARDIAC REHAB PHASE II ORIENTATION from 11/16/2022 in [place]  Referring Provider Dr. Dellia Cloud       Encounter Date: 11/20/2022  Check In:  Session Check In - 11/20/22 0928       Check-In   Supervising physician immediately available to respond to emergencies [facility] MD immediately available    Physician(s) Dr Domenic Polite    Location [facility]    Staff Present Leana Roe, BS, Exercise Physiologist;Dalton Sherrie George, MS, ACSM-CEP;Madelyn Flavors, RN, BSN    Virtual Visit No    Medication changes reported     No    Fall or balance concerns reported    No    Tobacco Cessation No Change    Warm-up and Cool-down Performed as group-led instruction    Resistance Training Performed Yes    VAD Patient? No    PAD/SET Patient? No      Pain Assessment   Currently in Pain? No/denies    Pain Score 0-No pain    Multiple Pain Sites No             Capillary Blood Glucose: No results found for this or any previous visit (from the past 24 hour(s)).    Social History   Tobacco Use  Smoking Status Never  Smokeless Tobacco Never    Goals Met:  Independence with exercise equipment Exercise tolerated well No report of concerns or symptoms today Strength training completed today  Goals Unmet:  Not Applicable  Comments: Checkout at 1030.   Dr. Carlyle Dolly is Medical Director for [facility] Cardiac Re[facility]

## 2022-11-22 ENCOUNTER — Encounter (HOSPITAL_COMMUNITY)
Admission: RE | Admit: 2022-11-22 | Discharge: 2022-11-22 | Disposition: A | Discharge status: Home / Self Care | Payer: Medicare Other | Source: Ambulatory Visit | Attending: Internal Medicine | Admitting: Internal Medicine

## 2022-11-22 DIAGNOSIS — I5022 Chronic systolic (congestive) heart failure: Secondary | ICD-10-CM | POA: Diagnosis not present

## 2022-11-22 NOTE — Progress Notes (Signed)
Cardiac Individual Treatment Plan  Patient Details  Name: Raven Green MRN: [id number] Date of Birth: 09/18/1955 Referring Provider:   Flowsheet Row CARDIAC REHAB PHASE II ORIENTATION from 11/16/2022 in [facility]  Referring Provider Dr. Dellia Cloud       Initial Encounter Date:  Flowsheet Row CARDIAC REHAB PHASE II ORIENTATION from 11/16/2022 in [facility]  Date 11/16/22       Visit Diagnosis: Heart failure, chronic systolic (HCC)  Patient's Home Medications on Admission:  Current Outpatient Medications:    dapagliflozin propanediol (FARXIGA) 10 MG TABS tablet, Take 1 tablet (10 mg total) by mouth daily before breakfast., Disp: 30 tablet, Rfl: 6   furosemide (LASIX) 40 MG tablet, Take 1 tablet (40 mg total) by mouth daily., Disp: 30 tablet, Rfl: 11   linaGLIPtin-metFORMIN HCl ER (JENTADUETO XR) 12-998 MG TB24, Take 1 tablet by mouth daily at 6 PM., Disp: , Rfl:    metoprolol tartrate (LOPRESSOR) 25 MG tablet, Take 1 tablet (25 mg total) by mouth 2 (two) times daily., Disp: 180 tablet, Rfl: 3   nitroGLYCERIN (NITROSTAT) 0.4 MG SL tablet, Place 1 tablet (0.4 mg total) under the tongue every 5 (five) minutes as needed for chest pain., Disp: 25 tablet, Rfl: 3   potassium chloride SA (KLOR-CON M) 20 MEQ tablet, Take 2 tablets (40 mEq total) by mouth 2 (two) times daily for 3 days, THEN 1 tablet (20 mEq total) 2 (two) times daily. (Patient taking differently: Take 1 tablet (20 mEq total) 2 (two) times daily.), Disp: 180 tablet, Rfl: 3   sacubitril-valsartan (ENTRESTO) 24-26 MG, Take 1 tablet by mouth 2 (two) times daily., Disp: 60 tablet, Rfl: 6   TOUJEO MAX SOLOSTAR 300 UNIT/ML Solostar Pen, Inject 44 Units into the skin daily at 10 pm., Disp: , Rfl:   Past Medical History: Past Medical History:  Diagnosis Date   Arthritis    Diabetes mellitus without complication ([place])    gestational   Endometriosis    History of migraine headaches     Hypertension    IBS (irritable bowel syndrome)     Tobacco Use: Social History   Tobacco Use  Smoking Status Never  Smokeless Tobacco Never    Labs: Review Flowsheet       Latest Ref Rng & Units 08/13/2013 10/12/2022  Labs for ITP Cardiac and Pulmonary Rehab  PH, Arterial 7.35 - 7.45 - 7.394   PCO2 arterial 32 - 48 mmHg - 45.4   Bicarbonate 20.0 - 28.0 mmol/L - 27.7  28.4  27.8   TCO2 22 - 32 mmol/L 28  29  30  29    O2 Saturation % - 95  72  72     Capillary Blood Glucose: Lab Results  Component Value Date   GLUCAP 155 (H) 11/16/2022   GLUCAP 147 (H) 04/20/2021   GLUCAP 136 (H) 08/13/2013    POCT Glucose     Row Name 11/20/22 1039             POCT Blood Glucose   Pre-Exercise 213 mg/dL                Exercise Target Goals: Exercise Program Goal: Individual exercise prescription set using results from initial 6 min walk test and THRR while considering  patient's activity barriers and safety.   Exercise Prescription Goal: Starting with aerobic activity 30 plus minutes a day, 3 days per week for initial exercise prescription. Provide home exercise prescription and guidelines that participant  acknowledges understanding prior to discharge.  Activity Barriers & Risk Stratification:  Activity Barriers & Cardiac Risk Stratification - 11/16/22 1323       Activity Barriers & Cardiac Risk Stratification   Activity Barriers Arthritis;Back Problems;Neck/Spine Problems;Joint Problems;Deconditioning;Decreased Ventricular Function    Cardiac Risk Stratification High             6 Minute Walk:  6 Minute Walk     Row Name 11/16/22 1316         6 Minute Walk   Phase Initial     Distance 1100 feet     Walk Time 6 minutes     # of Rest Breaks 0     MPH 2.08     METS 2.54     RPE 12     VO2 Peak 8.89     Symptoms No     Resting HR 75 bpm     Resting BP 112/60     Resting Oxygen Saturation  95 %     Exercise Oxygen Saturation  during 6 min walk 96 %      Max Ex. HR 98 bpm     Max Ex. BP 140/60     2 Minute Post BP 120/60              Oxygen Initial Assessment:   Oxygen Re-Evaluation:   Oxygen Discharge (Final Oxygen Re-Evaluation):   Initial Exercise Prescription:  Initial Exercise Prescription - 11/16/22 1300       Date of Initial Exercise RX and Referring Provider   Date 11/16/22    Referring Provider Dr. Dellia Cloud    Expected Discharge Date 02/16/23      NuStep   Level 1    SPM 80    Minutes 39      Prescription Details   Frequency (times per week) 3    Duration Progress to 30 minutes of continuous aerobic without signs/symptoms of physical distress      Intensity   THRR 40-80% of Max Heartrate 63-123    Ratings of Perceived Exertion 11-13      Resistance Training   Training Prescription Yes    Weight 0    Reps 10-15             Perform Capillary Blood Glucose checks as needed.  Exercise Prescription Changes:   Exercise Prescription Changes     Row Name 11/20/22 1000             Response to Exercise   Blood Pressure (Admit) 112/60       Blood Pressure (Exercise) 132/74       Blood Pressure (Exit) 112/62       Heart Rate (Admit) 85 bpm       Heart Rate (Exercise) 98 bpm       Heart Rate (Exit) 95 bpm       Rating of Perceived Exertion (Exercise) 12       Duration Continue with 30 min of aerobic exercise without signs/symptoms of physical distress.       Intensity THRR unchanged         Resistance Training   Training Prescription Yes       Weight 3       Reps 10-15       Time 10 Minutes         NuStep   Level 1       SPM 80       Minutes 39  METs 1.96                Exercise Comments:   Exercise Goals and Review:   Exercise Goals     Row Name 11/16/22 1319 11/20/22 1041           Exercise Goals   Increase Physical Activity Yes Yes      Intervention Provide advice, education, support and counseling about physical activity/exercise needs.;Develop an  individualized exercise prescription for aerobic and resistive training based on initial evaluation findings, risk stratification, comorbidities and participant's personal goals. Provide advice, education, support and counseling about physical activity/exercise needs.;Develop an individualized exercise prescription for aerobic and resistive training based on initial evaluation findings, risk stratification, comorbidities and participant's personal goals.      Expected Outcomes Short Term: Attend rehab on a regular basis to increase amount of physical activity.;Long Term: Add in home exercise to make exercise part of routine and to increase amount of physical activity.;Long Term: Exercising regularly at least 3-5 days a week. Short Term: Attend rehab on a regular basis to increase amount of physical activity.;Long Term: Add in home exercise to make exercise part of routine and to increase amount of physical activity.;Long Term: Exercising regularly at least 3-5 days a week.      Increase Strength and Stamina Yes Yes      Intervention Provide advice, education, support and counseling about physical activity/exercise needs.;Develop an individualized exercise prescription for aerobic and resistive training based on initial evaluation findings, risk stratification, comorbidities and participant's personal goals. Provide advice, education, support and counseling about physical activity/exercise needs.;Develop an individualized exercise prescription for aerobic and resistive training based on initial evaluation findings, risk stratification, comorbidities and participant's personal goals.      Expected Outcomes Short Term: Increase workloads from initial exercise prescription for resistance, speed, and METs.;Short Term: Perform resistance training exercises routinely during rehab and add in resistance training at home;Long Term: Improve cardiorespiratory fitness, muscular endurance and strength as measured by increased  METs and functional capacity (6MWT) Short Term: Increase workloads from initial exercise prescription for resistance, speed, and METs.;Short Term: Perform resistance training exercises routinely during rehab and add in resistance training at home;Long Term: Improve cardiorespiratory fitness, muscular endurance and strength as measured by increased METs and functional capacity (6MWT)      Able to understand and use rate of perceived exertion (RPE) scale Yes Yes      Intervention Provide education and explanation on how to use RPE scale Provide education and explanation on how to use RPE scale      Expected Outcomes Short Term: Able to use RPE daily in rehab to express subjective intensity level;Long Term:  Able to use RPE to guide intensity level when exercising independently Short Term: Able to use RPE daily in rehab to express subjective intensity level;Long Term:  Able to use RPE to guide intensity level when exercising independently      Knowledge and understanding of Target Heart Rate Range (THRR) Yes Yes      Intervention Provide education and explanation of THRR including how the numbers were predicted and where they are located for reference Provide education and explanation of THRR including how the numbers were predicted and where they are located for reference      Expected Outcomes Short Term: Able to state/look up THRR;Short Term: Able to use daily as guideline for intensity in rehab;Long Term: Able to use THRR to govern intensity when exercising independently Short Term: Able to state/look up  THRR;Short Term: Able to use daily as guideline for intensity in rehab;Long Term: Able to use THRR to govern intensity when exercising independently      Able to check pulse independently Yes Yes      Intervention Provide education and demonstration on how to check pulse in carotid and radial arteries.;Review the importance of being able to check your own pulse for safety during independent exercise Provide  education and demonstration on how to check pulse in carotid and radial arteries.;Review the importance of being able to check your own pulse for safety during independent exercise      Expected Outcomes Short Term: Able to explain why pulse checking is important during independent exercise;Long Term: Able to check pulse independently and accurately Short Term: Able to explain why pulse checking is important during independent exercise;Long Term: Able to check pulse independently and accurately      Understanding of Exercise Prescription Yes Yes      Intervention Provide education, explanation, and written materials on patient's individual exercise prescription Provide education, explanation, and written materials on patient's individual exercise prescription      Expected Outcomes Short Term: Able to explain program exercise prescription;Long Term: Able to explain home exercise prescription to exercise independently Short Term: Able to explain program exercise prescription;Long Term: Able to explain home exercise prescription to exercise independently               Exercise Goals Re-Evaluation :  Exercise Goals Re-Evaluation     Row Name 11/20/22 1043             Exercise Goal Re-Evaluation   Exercise Goals Review Increase Physical Activity;Increase Strength and Stamina;Able to understand and use rate of perceived exertion (RPE) scale;Knowledge and understanding of Target Heart Rate Range (THRR);Able to check pulse independently;Understanding of Exercise Prescription       Comments Pt has completed 1 exercise session of CR. She is limited by what she described as muscle pain/stiffness that she attributes to Lasix. She has been very sedentary over the past couple of months. She exercised at 1.96 METs on the stepper during her first session. Will continue to monitor and progress as able.       Expected Outcomes Through exercise at rehab and at home, the patient will meet their stated goals.                  Discharge Exercise Prescription (Final Exercise Prescription Changes):  Exercise Prescription Changes - 11/20/22 1000       Response to Exercise   Blood Pressure (Admit) 112/60    Blood Pressure (Exercise) 132/74    Blood Pressure (Exit) 112/62    Heart Rate (Admit) 85 bpm    Heart Rate (Exercise) 98 bpm    Heart Rate (Exit) 95 bpm    Rating of Perceived Exertion (Exercise) 12    Duration Continue with 30 min of aerobic exercise without signs/symptoms of physical distress.    Intensity THRR unchanged      Resistance Training   Training Prescription Yes    Weight 3    Reps 10-15    Time 10 Minutes      NuStep   Level 1    SPM 80    Minutes 39    METs 1.96             Nutrition:  Target Goals: Understanding of nutrition guidelines, daily intake of sodium 1500mg , cholesterol 200mg , calories 30% from fat and 7% or less from saturated  fats, daily to have 5 or more servings of fruits and vegetables.  Biometrics:  Pre Biometrics - 11/16/22 1406       Pre Biometrics   Height 5\' 4"  (1.626 m)    Weight 100 kg    BMI (Calculated) 37.82              Nutrition Therapy Plan and Nutrition Goals:  Nutrition Therapy & Goals - 11/16/22 1425       Personal Nutrition Goals   Comments Patient scored 56 on diet assessment. We provide educational sessions on heart healthy nutrition with handouts and assistance with RD referral if patient is interested.      Intervention Plan   Intervention Nutrition handout(s) given to patient.    Expected Outcomes Short Term Goal: Understand basic principles of dietary content, such as calories, fat, sodium, cholesterol and nutrients.             Nutrition Assessments:  Nutrition Assessments - 11/16/22 1330       MEDFICTS Scores   Pre Score 56            MEDIFICTS Score Key: ?70 Need to make dietary changes  40-70 Heart Healthy Diet ? 40 Therapeutic Level Cholesterol Diet   Picture Your Plate  Scores: [id number] Unhealthy dietary pattern with much room for improvement. 41-50 Dietary pattern unlikely to meet recommendations for good health and room for improvement. 51-60 More healthful dietary pattern, with some room for improvement.  >60 Healthy dietary pattern, although there may be some specific behaviors that could be improved.    Nutrition Goals Re-Evaluation:   Nutrition Goals Discharge (Final Nutrition Goals Re-Evaluation):   Psychosocial: Target Goals: Acknowledge presence or absence of significant depression and/or stress, maximize coping skills, provide positive support system. Participant is able to verbalize types and ability to use techniques and skills needed for reducing stress and depression.  Initial Review & Psychosocial Screening:  Initial Psych Review & Screening - 11/16/22 1328       Initial Review   Current issues with None Identified      Family Dynamics   Good Support System? Yes    Comments pt's husband      Barriers   Psychosocial barriers to participate in program There are no identifiable barriers or psychosocial needs.      Screening Interventions   Interventions Encouraged to exercise;Provide feedback about the scores to participant;Other (comment)    Expected Outcomes Short Term goal: Identification and review with participant of any Quality of Life or Depression concerns found by scoring the questionnaire.;Long Term goal: The participant improves quality of Life and PHQ9 Scores as seen by post scores and/or verbalization of changes             Quality of Life Scores:  Quality of Life - 11/16/22 1333       Quality of Life   Select Quality of Life      Quality of Life Scores   Health/Function Pre 14.5 %    Socioeconomic Pre 30 %    Psych/Spiritual Pre 19.71 %    Family Pre 21.6 %    GLOBAL Pre 19.5 %            Scores of 19 and below usually indicate a poorer quality of life in these areas.  A difference of  2-3 points is a  clinically meaningful difference.  A difference of 2-3 points in the total score of the Quality of Life Index has been associated with  significant improvement in overall quality of life, self-image, physical symptoms, and general health in studies assessing change in quality of life.  PHQ-9: Review Flowsheet       11/16/2022  Depression screen PHQ 2/9  Decreased Interest 1  Down, Depressed, Hopeless 0  PHQ - 2 Score 1  Altered sleeping 1  Tired, decreased energy 3  Change in appetite 2  Feeling bad or failure about yourself  0  Trouble concentrating 0  Moving slowly or fidgety/restless 0  Suicidal thoughts 0  PHQ-9 Score 7  Difficult doing work/chores Somewhat difficult   Interpretation of Total Score  Total Score Depression Severity:  1-4 = Minimal depression, 5-9 = Mild depression, 10-14 = Moderate depression, 15-19 = Moderately severe depression, 20-27 = Severe depression   Psychosocial Evaluation and Intervention:  Psychosocial Evaluation - 11/16/22 1447       Psychosocial Evaluation & Interventions   Interventions Stress management education;Relaxation education;Encouraged to exercise with the program and follow exercise prescription    Comments Patient has no identifiable psychosocial issues.  She has a good support system with her husband.  She has no other family in this area since she and her husband left [place] for her husband's job 40 years ago.  She scored a 7 on her PHQ-9, which relates to her fatigue, overeating, and trouble sleeping when she takes Lasix.  She is currently taking Lasix every few days, since the medication causes muscle aches in her legs and buttocks.  Her goals are to lose weight, increase her strength and endurance, and get back to "feeling normal."  We will continue to monitor her progress toward meeting these goals.    Expected Outcomes The pt will continue to have no identifiable psychosocial issues.    Continue Psychosocial Services  No Follow up  required             Psychosocial Re-Evaluation:  Psychosocial Re-Evaluation     [place] Name 11/20/22 1254             Psychosocial Re-Evaluation   Current issues with None Identified       Comments Patient is new to the program. She has completed 1 session. She continues to have no psychosocial barriers identified. We will continue to monitor her progress in the program.       Expected Outcomes Patient will continue to have no psychosocial barriers identified.       Interventions Relaxation education;Stress management education;Encouraged to attend Cardiac Rehabilitation for the exercise       Continue Psychosocial Services  No Follow up required                Psychosocial Discharge (Final Psychosocial Re-Evaluation):  Psychosocial Re-Evaluation - 11/20/22 1254       Psychosocial Re-Evaluation   Current issues with None Identified    Comments Patient is new to the program. She has completed 1 session. She continues to have no psychosocial barriers identified. We will continue to monitor her progress in the program.    Expected Outcomes Patient will continue to have no psychosocial barriers identified.    Interventions Relaxation education;Stress management education;Encouraged to attend Cardiac Rehabilitation for the exercise    Continue Psychosocial Services  No Follow up required             Vocational Rehabilitation: Provide vocational rehab assistance to qualifying candidates.   Vocational Rehab Evaluation & Intervention:  Vocational Rehab - 11/16/22 1251       Initial Vocational Rehab Evaluation &  Intervention   Assessment shows need for Vocational Rehabilitation No      Vocational Rehab Re-[facility]va[facility]   Comments retired             Education: Education Goals: Education classes will be provided on a weekly basis, covering required topics. Participant will state understanding/return demonstration of topics presented.  Learning  Barriers/Preferences:  Learning Barriers/Preferences - 11/16/22 1332       Learning Barriers/Preferences   Learning Barriers None    Learning Preferences Group Instruction             Education Topics: Hypertension, Hypertension Reduction -Define heart disease and high blood pressure. Discus how high blood pressure affects the body and ways to reduce high blood pressure.   Exercise and Your Heart -Discuss why it is important to exercise, the FITT principles of exercise, normal and abnormal responses to exercise, and how to exercise safely.   Angina -Discuss definition of angina, causes of angina, treatment of angina, and how to decrease risk of having angina.   Cardiac Medications -Review what the following cardiac medications are used for, how they affect the body, and side effects that may occur when taking the medications.  Medications include Aspirin, Beta blockers, calcium channel blockers, ACE Inhibitors, angiotensin receptor blockers, diuretics, digoxin, and antihyperlipidemics.   Congestive Heart Failure -Discuss the definition of CHF, how to live with CHF, the signs and symptoms of CHF, and how keep track of weight and sodium intake.   Heart Disease and Intimacy -Discus the effect sexual activity has on the heart, how changes occur during intimacy as we age, and safety during sexual activity.   Smoking Cessation / COPD -Discuss different methods to quit smoking, the health benefits of quitting smoking, and the definition of COPD.   Nutrition I: Fats -Discuss the types of cholesterol, what cholesterol does to the heart, and how cholesterol levels can be controlled.   Nutrition II: Labels -Discuss the different components of food labels and how to read food label   Heart Parts/Heart Disease and PAD -Discuss the anatomy of the heart, the pathway of blood circulation through the heart, and these are affected by heart disease.   Stress I: Signs and  Symptoms -Discuss the causes of stress, how stress may lead to anxiety and depression, and ways to limit stress.   Stress II: Relaxation -Discuss different types of relaxation techniques to limit stress.   Warning Signs of Stroke / TIA -Discuss definition of a stroke, what the signs and symptoms are of a stroke, and how to identify when someone is having stroke.   Knowledge Questionnaire Score:  Knowledge Questionnaire Score - 11/16/22 1333       Knowledge Questionnaire Score   Pre Score 21/24             Core Components/Risk Factors/Patient Goals at Admission:  Personal Goals and Risk Factors at Admission - 11/16/22 1336       Core Components/Risk Factors/Patient Goals on Admission    Weight Management Weight Loss;Yes    Intervention Weight Management: Develop a combined nutrition and exercise program designed to reach desired caloric intake, while maintaining appropriate intake of nutrient and fiber, sodium and fats, and appropriate energy expenditure required for the weight goal.;Weight Management: Provide education and appropriate resources to help participant work on and attain dietary goals.;Weight Management/Obesity: Establish reasonable short term and long term weight goals.;Obesity: Provide education and appropriate resources to help participant work on and attain dietary goals.    Expected Outcomes  Understanding of distribution of calorie intake throughout the day with the consumption of 4-5 meals/snacks;Understanding recommendations for meals to include 15-35% energy as protein, 25-35% energy from fat, 35-60% energy from carbohydrates, less than 200mg  of dietary cholesterol, 20-35 gm of total fiber daily;Weight Loss: Understanding of general recommendations for a balanced deficit meal plan, which promotes 1-2 lb weight loss per week and includes a negative energy balance of [phone] kcal/d;Weight Maintenance: Understanding of the daily nutrition guidelines, which includes  25-35% calories from fat, 7% or less cal from saturated fats, less than 200mg  cholesterol, less than 1.5gm of sodium, & 5 or more servings of fruits and vegetables daily;Long Term: Adherence to nutrition and physical activity/exercise program aimed toward attainment of established weight goal;Short Term: Continue to assess and modify interventions until short term weight is achieved    Improve shortness of breath with ADL's Yes    Intervention Provide education, individualized exercise plan and daily activity instruction to help decrease symptoms of SOB with activities of daily living.    Expected Outcomes Short Term: Improve cardiorespiratory fitness to achieve a reduction of symptoms when performing ADLs;Long Term: Be able to perform more ADLs without symptoms or delay the onset of symptoms    Diabetes Yes    Intervention Provide education about signs/symptoms and action to take for hypo/hyperglycemia.;Provide education about proper nutrition, including hydration, and aerobic/resistive exercise prescription along with prescribed medications to achieve blood glucose in normal ranges: Fasting glucose 65-99 mg/dL    Expected Outcomes Short Term: Participant verbalizes understanding of the signs/symptoms and immediate care of hyper/hypoglycemia, proper foot care and importance of medication, aerobic/resistive exercise and nutrition plan for blood glucose control.;Long Term: Attainment of HbA1C < 7%.    Heart Failure Yes    Intervention Provide a combined exercise and nutrition program that is supplemented with education, support and counseling about heart failure. Directed toward relieving symptoms such as shortness of breath, decreased exercise tolerance, and extremity edema.    Expected Outcomes Improve functional capacity of life;Short term: Attendance in program 2-3 days a week with increased exercise capacity. Reported lower sodium intake. Reported increased fruit and vegetable intake. Reports medication  compliance.;Short term: Daily weights obtained and reported for increase. Utilizing diuretic protocols set by physician.;Long term: Adoption of self-care skills and reduction of barriers for early signs and symptoms recognition and intervention leading to self-care maintenance.    Hypertension Yes    Intervention Provide education on lifestyle modifcations including regular physical activity/exercise, weight management, moderate sodium restriction and increased consumption of fresh fruit, vegetables, and low fat dairy, alcohol moderation, and smoking cessation.;Monitor prescription use compliance.    Expected Outcomes Short Term: Continued assessment and intervention until BP is < 140/20mm HG in hypertensive participants. < 130/64mm HG in hypertensive participants with diabetes, heart failure or chronic kidney disease.;Long Term: Maintenance of blood pressure at goal levels.    Personal Goal Other Yes    Personal Goal Get to feeling "back to normal", increase strength and endurance.    Intervention Pt will attend CR program 3 days a week and begin a home exercise program.    Expected Outcomes Pt will meet both her personal and program goals.             Core Components/Risk Factors/Patient Goals Review:   Goals and Risk Factor Review     Row Name 11/20/22 1256             Core Components/Risk Factors/Patient Goals Review   Personal Goals Review Weight  Management/Obesity;Improve shortness of breath with ADL's;Diabetes;Hypertension;Heart Failure;Other       Review Patient was referred to CR with chronic systolic HF. She has multiple risk factors for CAD and is participating in the program for risk modification. She has completed 1 sessions. Her current weight is 218.4 lbs. Her DM is managed with Toujeo; Jentadueto and [place].  She has no A1C on file. Her personal goals for the program are to lose weight; increase her strength and endurance and get back to feeling normal. We will continue to  monitor her progress as she works towards meeting these goals.       Expected Outcomes Patient will complete the program meeting both personal and program goals.                Core Components/Risk Factors/Patient Goals at Discharge (Final Review):   Goals and Risk Factor Review - 11/20/22 1256       Core Components/Risk Factors/Patient Goals Review   Personal Goals Review Weight Management/Obesity;Improve shortness of breath with ADL's;Diabetes;Hypertension;Heart Failure;Other    Review Patient was referred to CR with chronic systolic HF. She has multiple risk factors for CAD and is participating in the program for risk modification. She has completed 1 sessions. Her current weight is 218.4 lbs. Her DM is managed with Toujeo; Jentadueto and [place].  She has no A1C on file. Her personal goals for the program are to lose weight; increase her strength and endurance and get back to feeling normal. We will continue to monitor her progress as she works towards meeting these goals.    Expected Outcomes Patient will complete the program meeting both personal and program goals.             ITP Comments:   Comments: ITP REVIEW Pt is making expected progress toward Cardiac Rehab goals after completing 2 sessions. Recommend continued exercise, life style modification, education, and increased stamina and strength.

## 2022-11-22 NOTE — Progress Notes (Signed)
Daily Session Note  Patient Details  Name: DESLYN SCHMELTER MRN: [id number] Date of Birth: 1956-01-05 Referring Provider:   Flowsheet Row CARDIAC REHAB PHASE II ORIENTATION from 11/16/2022 in [place]  Referring Provider Dr. Dellia Cloud       Encounter Date: 11/22/2022  Check In:  Session Check In - 11/22/22 0930       Check-In   Supervising physician immediately available to respond to emergencies [facility] MD immediately available    Physician(s) Dr Domenic Polite    Location [facility]    Staff Present Leana Roe, BS, Exercise Physiologist;Hillary Troutman BSN, RN    Virtual Visit No    Medication changes reported     No    Fall or balance concerns reported    No    Tobacco Cessation No Change    Warm-up and Cool-down Performed as group-led instruction    Resistance Training Performed Yes    VAD Patient? No    PAD/SET Patient? No      Pain Assessment   Currently in Pain? No/denies    Pain Score 0-No pain    Multiple Pain Sites No             Capillary Blood Glucose: No results found for this or any previous visit (from the past 24 hour(s)).    Social History   Tobacco Use  Smoking Status Never  Smokeless Tobacco Never    Goals Met:  Independence with exercise equipment Exercise tolerated well No report of concerns or symptoms today Strength training completed today  Goals Unmet:  Not Applicable  Comments: check out 1030   Dr. Carlyle Dolly is Medical Director for [facility]

## 2022-11-24 ENCOUNTER — Encounter (HOSPITAL_COMMUNITY): Payer: Medicare Other

## 2022-11-27 ENCOUNTER — Encounter (HOSPITAL_COMMUNITY)
Admission: RE | Admit: 2022-11-27 | Discharge: 2022-11-27 | Disposition: A | Discharge status: Home / Self Care | Payer: Medicare Other | Source: Ambulatory Visit | Attending: Internal Medicine | Admitting: Internal Medicine

## 2022-11-27 DIAGNOSIS — I5022 Chronic systolic (congestive) heart failure: Secondary | ICD-10-CM | POA: Insufficient documentation

## 2022-11-27 NOTE — Progress Notes (Signed)
Daily Session Note  Patient Details  Name: Raven Green MRN: [id number] Date of Birth: Jan 09, 1956 Referring Provider:   Flowsheet Row CARDIAC REHAB PHASE II ORIENTATION from 11/16/2022 in [place]  Referring Provider Dr. Dellia Cloud       Encounter Date: 11/27/2022  Check In:  Session Check In - 11/27/22 0930       Check-In   Supervising physician immediately available to respond to emergencies [facility] MD immediately available    Physician(s) Dr Dellia Cloud    Location [facility]    Staff Present Leana Roe, BS, Exercise Physiologist;Shrita Thien Hassell Done, RN, BSN;Dalton Sherrie George, MS, ACSM-CEP    Virtual Visit No    Medication changes reported     No    Fall or balance concerns reported    No    Tobacco Cessation No Change    Warm-up and Cool-down Performed as group-led instruction    Resistance Training Performed Yes    VAD Patient? No    PAD/SET Patient? No      Pain Assessment   Currently in Pain? Yes    Pain Score 6     Pain Location Hip    Pain Orientation Right;Left    Pain Descriptors / Indicators Aching    Pain Type Chronic pain    Multiple Pain Sites Yes      2nd Pain Site   Pain Score 6    Pain Location Leg    Pain Orientation Right;Left    Pain Descriptors / Indicators Aching    Pain Type Chronic pain             Capillary Blood Glucose: No results found for this or any previous visit (from the past 24 hour(s)).    Social History   Tobacco Use  Smoking Status Never  Smokeless Tobacco Never    Goals Met:  Independence with exercise equipment Exercise tolerated well No report of concerns or symptoms today Strength training completed today  Goals Unmet:  Not Applicable  Comments: Checkout at 1030.   Dr. Carlyle Dolly is Medical Director for [facility] Cardiac Rehab

## 2022-11-29 ENCOUNTER — Encounter (HOSPITAL_COMMUNITY)
Admission: RE | Admit: 2022-11-29 | Discharge: 2022-11-29 | Disposition: A | Discharge status: Home / Self Care | Payer: Medicare Other | Source: Ambulatory Visit | Attending: Internal Medicine | Admitting: Internal Medicine

## 2022-11-29 DIAGNOSIS — I5022 Chronic systolic (congestive) heart failure: Secondary | ICD-10-CM | POA: Diagnosis not present

## 2022-11-29 NOTE — Progress Notes (Signed)
Daily Session Note  Patient Details  Name: Raven Green MRN: [id number] Date of Birth: 04-28-1956 Referring Provider:   Flowsheet Row CARDIAC REHAB PHASE II ORIENTATION from 11/16/2022 in [place]  Referring Provider Dr. Dellia Cloud       Encounter Date: 11/29/2022  Check In:  Session Check In - 11/29/22 0930       Check-In   Supervising physician immediately available to respond to emergencies [facility] MD immediately available    Physician(s) Dr Dellia Cloud    Location [facility]    Staff Present Leana Roe, BS, Exercise Physiologist;Dalton Sherrie George, MS, ACSM-CEP;Melven Sartorius BSN, RN    Virtual Visit No    Medication changes reported     No    Fall or balance concerns reported    No    Tobacco Cessation No Change    Warm-up and Cool-down Performed as group-led Higher education careers adviser Performed Yes    VAD Patient? No    PAD/SET Patient? No      Pain Assessment   Currently in Pain? No/denies    Pain Score 0-No pain    Multiple Pain Sites No             Capillary Blood Glucose: No results found for this or any previous visit (from the past 24 hour(s)).    Social History   Tobacco Use  Smoking Status Never  Smokeless Tobacco Never    Goals Met:  Independence with exercise equipment Exercise tolerated well No report of concerns or symptoms today Strength training completed today  Goals Unmet:  Not Applicable  Comments: check out at 10:30   Dr. Carlyle Dolly is Medical Director for [place]

## 2022-12-01 ENCOUNTER — Encounter (HOSPITAL_COMMUNITY)
Admission: RE | Admit: 2022-12-01 | Discharge: 2022-12-01 | Disposition: A | Discharge status: Home / Self Care | Payer: Medicare Other | Source: Ambulatory Visit | Attending: Internal Medicine | Admitting: Internal Medicine

## 2022-12-01 ENCOUNTER — Encounter (HOSPITAL_COMMUNITY): Payer: Medicare Other

## 2022-12-01 DIAGNOSIS — I5022 Chronic systolic (congestive) heart failure: Secondary | ICD-10-CM

## 2022-12-01 NOTE — Progress Notes (Signed)
Daily Session Note  Patient Details  Name: Raven Green MRN: [id number] Date of Birth: 07-Sep-1955 Referring Provider:   Flowsheet Row CARDIAC REHAB PHASE II ORIENTATION from 11/16/2022 in [facility] CARDIAC REHABILITATION  Referring Provider Dr. Jenene Slicker       Encounter Date: 12/01/2022  Check In:  Session Check In - 12/01/22 0923       Check-In   Supervising physician immediately available to respond to emergencies [facility] MD immediately available    Physician(s) Dr Jenene Slicker    Location [facility]    Staff Present Ross Ludwig, BS, Exercise Physiologist;Dalton Debbe Mounts, MS, ACSM-CEP;Staci Righter, RN, BSN    Virtual Visit No    Medication changes reported     No    Fall or balance concerns reported    No    Tobacco Cessation No Change    Warm-up and Cool-down Performed as group-led instruction    Resistance Training Performed Yes    VAD Patient? No    PAD/SET Patient? No      Pain Assessment   Currently in Pain? No/denies    Pain Score 0-No pain    Multiple Pain Sites No             Capillary Blood Glucose: No results found for this or any previous visit (from the past 24 hour(s)).    Social History   Tobacco Use  Smoking Status Never  Smokeless Tobacco Never    Goals Met:  Independence with exercise equipment Exercise tolerated well No report of concerns or symptoms today Strength training completed today  Goals Unmet:  Not Applicable  Comments: Checkout at 1030.   Dr. Dina Rich is Medical Director for [facility] Cardiac Rehab

## 2022-12-04 ENCOUNTER — Encounter (HOSPITAL_COMMUNITY)
Admission: RE | Admit: 2022-12-04 | Discharge: 2022-12-04 | Disposition: A | Discharge status: Home / Self Care | Payer: Medicare Other | Source: Ambulatory Visit | Attending: Internal Medicine | Admitting: Internal Medicine

## 2022-12-04 VITALS — Wt 221.8 lb

## 2022-12-04 DIAGNOSIS — I5022 Chronic systolic (congestive) heart failure: Secondary | ICD-10-CM | POA: Diagnosis not present

## 2022-12-04 NOTE — Progress Notes (Signed)
Daily Session Note  Patient Details  Name: Raven Green MRN: [id number] Date of Birth: [birth year] Referring Provider:   Flowsheet Row CARDIAC REHAB PHASE II ORIENTATION from 11/16/2022 in [facility] CARDIAC REHABILITATION  Referring Provider Dr. Jenene Slicker       Encounter Date: 12/04/2022  Check In:  Session Check In - 12/04/22 0930       Check-In   Supervising physician immediately available to respond to emergencies [facility] MD immediately available    Physician(s) Dr Wyline Mood    Location [facility]    Staff Present Ross Ludwig, BS, Exercise Physiologist;Dalton Debbe Mounts, MS, ACSM-CEP;Staci Righter, RN, BSN    Virtual Visit No    Medication changes reported     No    Fall or balance concerns reported    No    Tobacco Cessation No Change    Warm-up and Cool-down Performed as group-led instruction    Resistance Training Performed Yes      Pain Assessment   Currently in Pain? No/denies    Pain Score 0-No pain    Multiple Pain Sites No             Capillary Blood Glucose: No results found for this or any previous visit (from the past 24 hour(s)).    Social History   Tobacco Use  Smoking Status Never  Smokeless Tobacco Never    Goals Met:  Independence with exercise equipment Exercise tolerated well No report of concerns or symptoms today Strength training completed today  Goals Unmet:  Not Applicable  Comments: Checkout at 1030.   Dr. Dina Rich is Medical Director for [facility] Cardiac Rehab

## 2022-12-05 ENCOUNTER — Ambulatory Visit: Payer: Medicare Other | Attending: Internal Medicine | Admitting: Internal Medicine

## 2022-12-05 ENCOUNTER — Encounter: Payer: Self-pay | Admitting: Internal Medicine

## 2022-12-05 VITALS — BP 118/72 | HR 80 | Ht 64.0 in | Wt 220.0 lb

## 2022-12-05 DIAGNOSIS — Z79899 Other long term (current) drug therapy: Secondary | ICD-10-CM

## 2022-12-05 DIAGNOSIS — I428 Other cardiomyopathies: Secondary | ICD-10-CM

## 2022-12-05 DIAGNOSIS — I10 Essential (primary) hypertension: Secondary | ICD-10-CM

## 2022-12-05 DIAGNOSIS — R252 Cramp and spasm: Secondary | ICD-10-CM

## 2022-12-05 MED ORDER — SPIRONOLACTONE 25 MG PO TABS: 25.0000 mg | ORAL_TABLET | Freq: Every day | ORAL | 1 refills | Status: DC

## 2022-12-05 MED ORDER — ENTRESTO 49-51 MG PO TABS: 1.0000 | ORAL_TABLET | Freq: Two times a day (BID) | ORAL | 6 refills | Status: DC

## 2022-12-05 NOTE — Patient Instructions (Addendum)
Medication Instructions:  Your physician has recommended you make the following change in your medication:  Increase entresto to 49/51 mg twice daily Start spironolactone 25 mg daily Continue other medications the same  Labwork: CMET on 12/11/2022 @[facility] Non-fasting  Testing/Procedures: Your physician has requested that you have an echocardiogram in 3 months Echocardiography is a painless test that uses sound waves to create images of your heart. It provides your doctor with information about the size and shape of your heart and how well your heart's chambers and valves are working. This procedure takes approximately one hour. There are no restrictions for this procedure. Please do NOT wear cologne, perfume, aftershave, or lotions (deodorant is allowed). Please arrive 15 minutes prior to your appointment time.  Follow-Up: Your physician recommends that you schedule a follow-up appointment in: 4 months  Any Other Special Instructions Will Be Listed Below (If Applicable).  If you need a refill on your cardiac medications before your next appointment, please call your pharmacy.

## 2022-12-05 NOTE — Progress Notes (Signed)
Cardiology Office Note  Date: 12/05/2022   ID: Raven Green, DOB 1955-10-30, MRN [id number]  PCP:  Benita Stabile, MD  Cardiologist:  Marjo Bicker, MD Electrophysiologist:  None   Reason for Office Visit: Follow-up of an CM   History of Present Illness: Raven Green is a 67 y.o. female known to have NICM LVEF 30 to 35%, HTN, DM 2, IBS, splenic artery aneurysm s/p embolization in 2014 presented to cardiology clinic for follow-up visit.  Patient was initially referred to cardiology clinic in 08/2022 for evaluation of substernal exertional chest heaviness x 2 months, lasting for few minutes and resolving with rest.  Associated with severe exertional fatigue and SOB. She also had abdominal tightness that starts followed by chest heaviness.  She underwent EGD and colonoscopy for further evaluation of abdominal tightness in the past which were unremarkable.  She had ER visit on 09/13/2022 for evaluation of chest heaviness and DOE.  Due to imaging evidence of pulmonary edema, she was given IV Lasix with significant improvement in her symptoms.  She was discharged on Lasix 20 mg once daily. Her pulmonary artery was also enlarged to 3.7 cm on CT chest. High sensitive troponins were mildly elevated at 19 and 21.  Echocardiogram from 09/2022 showed new onset cardiomyopathy, LVEF 30 to 35%.  She underwent LHC in 09/2022 which showed nonobstructive CAD (10% proximal to mid LAD stenosis) and RHC showed mild pulmonary HTN. She is here for follow-up visit.  Denies any chest heaviness, denies any DOE, leg swelling, palpitations, dizziness, syncope.  She has severe muscle cramps and said it worsened after beginning Lasix.  After she started to take Lasix once in couple of days, leg aching completely resolved.  Denies smoking cigarettes.  Past Medical History:  Diagnosis Date   Arthritis    Diabetes mellitus without complication    gestational   Endometriosis    History of migraine headaches     Hypertension    IBS (irritable bowel syndrome)     Past Surgical History:  Procedure Laterality Date   BACK SURGERY     BIOPSY  04/20/2021   Procedure: BIOPSY;  Surgeon: Marguerita Merles, Reuel Boom, MD;  [facility];  Service: Gastroenterology;;  small bowel, gastric   BREAST BIOPSY Left    Fibroadenoma   COLONOSCOPY WITH PROPOFOL N/A 04/20/2021   Procedure: COLONOSCOPY WITH PROPOFOL;  Surgeon: Dolores Frame, MD;  Location: [facility];  Service: Gastroenterology;  Laterality: N/A;   EMBOLIZATION  08/13/2013   Procedure: EMBOLIZATION;  Surgeon: Fransisco Hertz, MD;  Location: [facility] CATH LAB;  Service: Cardiovascular;;  splenic artery aneurysm   ESOPHAGOGASTRODUODENOSCOPY (EGD) WITH PROPOFOL N/A 04/20/2021   Procedure: ESOPHAGOGASTRODUODENOSCOPY (EGD) WITH PROPOFOL;  Surgeon: Dolores Frame, MD;  Location[facility];  Service: Gastroenterology;  Laterality: N/A;  9:15   POLYPECTOMY  04/20/2021   Procedure: POLYPECTOMY;  Surgeon: Dolores Frame, MD;  Location: [facility];  Service: Gastroenterology;;   RIGHT/LEFT HEART CATH AND CORONARY ANGIOGRAPHY N/A 10/12/2022   Procedure: RIGHT/LEFT HEART CATH AND CORONARY ANGIOGRAPHY;  Surgeon: Lennette Bihari, MD;  Location: [facility] INVASIVE CV LAB;  Service: Cardiovascular;  Laterality: N/A;   TONSILLECTOMY AND ADENOIDECTOMY     VISCERAL ANGIOGRAM N/A 08/13/2013   Procedure: VISCERAL ANGIOGRAM;  Surgeon: Fransisco Hertz, MD;  Location: [facility] CATH LAB;  Service: Cardiovascular;  Laterality: N/A;    Current Outpatient Medications  Medication Sig Dispense Refill   dapagliflozin propanediol (FARXIGA) 10  MG TABS tablet Take 1 tablet (10 mg total) by mouth daily before breakfast. 30 tablet 6   furosemide (LASIX) 40 MG tablet Take 1 tablet (40 mg total) by mouth daily. 30 tablet 11   linaGLIPtin-metFORMIN HCl ER (JENTADUETO XR) 12-998 MG TB24 Take 1 tablet by mouth daily at 6 PM.     metoprolol tartrate (LOPRESSOR) 25 MG  tablet Take 1 tablet (25 mg total) by mouth 2 (two) times daily. 180 tablet 3   nitroGLYCERIN (NITROSTAT) 0.4 MG SL tablet Place 1 tablet (0.4 mg total) under the tongue every 5 (five) minutes as needed for chest pain. 25 tablet 3   potassium chloride SA (KLOR-CON M) 20 MEQ tablet Take 2 tablets (40 mEq total) by mouth 2 (two) times daily for 3 days, THEN 1 tablet (20 mEq total) 2 (two) times daily. (Patient taking differently: Take 1 tablet (20 mEq total) 2 (two) times daily.) 180 tablet 3   sacubitril-valsartan (ENTRESTO) 24-26 MG Take 1 tablet by mouth 2 (two) times daily. 60 tablet 6   TOUJEO MAX SOLOSTAR 300 UNIT/ML Solostar Pen Inject 44 Units into the skin daily at 10 pm.     No current facility-administered medications for this visit.   Allergies:  Codeine and Sulfa antibiotics   Social History: The patient  reports that she has never smoked. She has never used smokeless tobacco. She reports that she does not currently use alcohol. She reports that she does not use drugs.   Family History: The patient's family history includes Breast cancer in her maternal grandmother; Cancer in her father, maternal grandfather, and maternal grandmother; Deep vein thrombosis in her mother; Diabetes in her father and sister; Gallstones in her maternal grandmother; Hyperlipidemia in her mother; Hypertension in her father and mother; Other in her mother; Peripheral vascular disease in her father; Varicose Veins in her mother.   ROS:  Please see the history of present illness. Otherwise, complete review of systems is positive for none.  All other systems are reviewed and negative.   Physical Exam: VS:  BP 118/72   Pulse 80   Ht 5\' 4"  (1.626 m)   Wt 220 lb (99.8 kg)   SpO2 96%   BMI 37.76 kg/m , BMI Body mass index is 37.76 kg/m.  Wt Readings from Last 3 Encounters:  12/05/22 220 lb (99.8 kg)  12/04/22 221 lb 12.5 oz (100.6 kg)  11/20/22 218 lb 4.1 oz (99 kg)    General: Patient appears comfortable  at rest. HEENT: Conjunctiva and lids normal, oropharynx clear with moist mucosa. Neck: Supple, no elevated JVP or carotid bruits, no thyromegaly. Lungs: Clear to auscultation, nonlabored breathing at rest. Cardiac: Regular rate and rhythm, no S3 or significant systolic murmur, no pericardial rub. Abdomen: Soft, nontender, no hepatomegaly, bowel sounds present, no guarding or rebound. Extremities: No pitting edema, distal pulses 2+. Skin: Warm and dry. Musculoskeletal: No kyphosis. Neuropsychiatric: Alert and oriented x3, affect grossly appropriate.  Recent Labwork: 09/13/2022: ALT 30; AST 31; B Natriuretic Peptide 548.0; Platelets 263 09/26/2022: Magnesium 2.2 10/12/2022: Hemoglobin 12.2 11/06/2022: BUN 17; Creatinine, Ser 0.93; Potassium 4.9; Sodium 145  No results found for: "CHOL", "TRIG", "HDL", "CHOLHDL", "VLDL", "LDLCALC", "LDLDIRECT"  Other Studies Reviewed Today: RHC/LHC in 09/2022 10% LAD stenosis Nonobstructive CAD Mild pulmonary hypertension  Echocardiogram from 09/2022 LVEF 30-35% G2 DD RV systolic function is normal TR inadequate for assessing PA pressure  Assessment and Plan: Patient is a 67 year old F known to have NICM LVEF 30-35%, HTN, DM 2,  IBS, splenic artery aneurysm s/p embolization in 2014 presented to cardiology clinic for follow-up visit.  # NICM LVEF 30 to 35% (diagnosed in 07/2022) # Chronic heart failure -Patient underwent LHC on 10/12/2022 for new onset cardiomyopathy, LHC showed nonobstructive CAD (proximal LAD to mid LAD 10% stenosis) and RHC showed mild pulmonary hypertension. -Continue Lasix 40 mg once in couple of days due to severe LLE aching if she takes it daily -Continue metoprolol tartrate 25 mg twice daily -Increase Entresto 49-51 mg twice daily -Continue Farxiga 10 mg once daily -Start spironolactone 25 mg once daily -Obtain CMP in 5 days -Continue cardiac rehab sessions for chronic heart failure  # Mild pulmonary HTN -Continue above  management  # HTN, controlled -As stated above  # Left lower extremity aching, rule out PAD -PAD is ruled out, LLE aching are likely secondary to Lasix use. She does not have any aching of left lower extremity if she takes Lasix every couple of days. Continue current regimen of Lasix use, once in every couple of days.  I have spent a total of 33 minutes with patient reviewing chart, EKGs, labs and examining patient as well as establishing an assessment and plan that was discussed with the patient.  > 50% of time was spent in direct patient care.    Medication Adjustments/Labs and Tests Ordered: Current medicines are reviewed at length with the patient today.  Concerns regarding medicines are outlined above.   Tests Ordered: Orders Placed This Encounter  Procedures   Comprehensive metabolic panel   ECHOCARDIOGRAM COMPLETE     Medication Changes: Meds ordered this encounter  Medications   sacubitril-valsartan (ENTRESTO) 49-51 MG    Sig: Take 1 tablet by mouth 2 (two) times daily.    Dispense:  60 tablet    Refill:  6    12/05/2022 dose increase   spironolactone (ALDACTONE) 25 MG tablet    Sig: Take 1 tablet (25 mg total) by mouth daily.    Dispense:  90 tablet    Refill:  1    12/05/2022 NEW      Disposition:  Follow up 4 months  Signed Esma Kilts Verne Spurr, MD, 12/05/2022 3:13 PM    [facility] [address] [place], [place], [place] [address]

## 2022-12-06 ENCOUNTER — Encounter (HOSPITAL_COMMUNITY)
Admission: RE | Admit: 2022-12-06 | Discharge: 2022-12-06 | Disposition: A | Discharge status: Home / Self Care | Payer: Medicare Other | Source: Ambulatory Visit | Attending: Internal Medicine | Admitting: Internal Medicine

## 2022-12-06 DIAGNOSIS — I5022 Chronic systolic (congestive) heart failure: Secondary | ICD-10-CM | POA: Diagnosis not present

## 2022-12-06 NOTE — Progress Notes (Signed)
Daily Session Note  Patient Details  Name: Raven Green MRN: [id number] Date of Birth: 10-09-1955 Referring Provider:   Flowsheet Row CARDIAC REHAB PHASE II ORIENTATION from 11/16/2022 in [facility] CARDIAC REHABILITATION  Referring Provider Dr. Jenene Slicker       Encounter Date: 12/06/2022  Check In:  Session Check In - 12/06/22 0929       Check-In   Supervising physician immediately available to respond to emergencies [facility] MD immediately available    Physician(s) Dr Wyline Mood    Location [facility]    Staff Present Ross Ludwig, BS, Exercise Physiologist;Arlynn Mcdermid Daphine Deutscher, RN, Neal Dy, RN, BSN    Virtual Visit No    Medication changes reported     No    Fall or balance concerns reported    No    Tobacco Cessation No Change    Warm-up and Cool-down Performed as group-led instruction    Resistance Training Performed Yes      Pain Assessment   Currently in Pain? No/denies    Pain Score 0-No pain    Multiple Pain Sites No             Capillary Blood Glucose: No results found for this or any previous visit (from the past 24 hour(s)).    Social History   Tobacco Use  Smoking Status Never  Smokeless Tobacco Never    Goals Met:  Independence with exercise equipment Exercise tolerated well No report of concerns or symptoms today Strength training completed today  Goals Unmet:  Not Applicable  Comments: Checkout to 1030.   Dr. Dina Rich is Medical Director for [facility] Cardiac Rehab

## 2022-12-08 ENCOUNTER — Encounter (HOSPITAL_COMMUNITY): Payer: Medicare Other

## 2022-12-11 ENCOUNTER — Other Ambulatory Visit (HOSPITAL_COMMUNITY)
Admission: RE | Admit: 2022-12-11 | Discharge: 2022-12-11 | Disposition: A | Discharge status: Home / Self Care | Payer: Medicare Other | Source: Ambulatory Visit | Attending: Internal Medicine | Admitting: Internal Medicine

## 2022-12-11 ENCOUNTER — Encounter (HOSPITAL_COMMUNITY)
Admission: RE | Admit: 2022-12-11 | Discharge: 2022-12-11 | Disposition: A | Discharge status: Home / Self Care | Payer: Medicare Other | Source: Ambulatory Visit | Attending: Internal Medicine | Admitting: Internal Medicine

## 2022-12-11 DIAGNOSIS — Z79899 Other long term (current) drug therapy: Secondary | ICD-10-CM | POA: Insufficient documentation

## 2022-12-11 DIAGNOSIS — I5022 Chronic systolic (congestive) heart failure: Secondary | ICD-10-CM | POA: Diagnosis not present

## 2022-12-11 DIAGNOSIS — I428 Other cardiomyopathies: Secondary | ICD-10-CM | POA: Insufficient documentation

## 2022-12-11 LAB — COMPREHENSIVE METABOLIC PANEL
ALT: 15 U/L (ref 0–44)
AST: 16 U/L (ref 15–41)
Albumin: 3.4 g/dL — ABNORMAL LOW (ref 3.5–5.0)
Alkaline Phosphatase: 66 U/L (ref 38–126)
Anion gap: 4 — ABNORMAL LOW (ref 5–15)
BUN: 16 mg/dL (ref 8–23)
CO2: 27 mmol/L (ref 22–32)
Calcium: 8.8 mg/dL — ABNORMAL LOW (ref 8.9–10.3)
Chloride: 106 mmol/L (ref 98–111)
Creatinine, Ser: 0.85 mg/dL (ref 0.44–1.00)
GFR, Estimated: >60 mL/min (ref >60)
Glucose, Bld: 98 mg/dL (ref 70–99)
Potassium: 4.7 mmol/L (ref 3.5–5.1)
Sodium: 137 mmol/L (ref 135–145)
Total Bilirubin: 0.4 mg/dL (ref 0.3–1.2)
Total Protein: 7.2 g/dL (ref 6.5–8.1)

## 2022-12-11 NOTE — Progress Notes (Signed)
Daily Session Note  Patient Details  Name: Raven Green MRN: [id number] Date of Birth: May 13, 1956 Referring Provider:   Flowsheet Row CARDIAC REHAB PHASE II ORIENTATION from 11/16/2022 in [facility] CARDIAC REHABILITATION  Referring Provider Dr. Jenene Slicker       Encounter Date: 12/11/2022  Check In:  Session Check In - 12/11/22 0930       Check-In   Supervising physician immediately available to respond to emergencies [facility] MD immediately available    Physician(s) Dr. Diona Browner    Staff Present Ross Ludwig, BS, Exercise Physiologist;Debra Laural Benes, RN, Pleas Koch, RN, BSN    Virtual Visit No    Medication changes reported     No    Fall or balance concerns reported    No    Tobacco Cessation No Change    Warm-up and Cool-down Performed as group-led instruction    Resistance Training Performed Yes    VAD Patient? No    PAD/SET Patient? No      Pain Assessment   Currently in Pain? No/denies    Pain Score 0-No pain    Multiple Pain Sites No             Capillary Blood Glucose: No results found for this or any previous visit (from the past 24 hour(s)).    Social History   Tobacco Use  Smoking Status Never  Smokeless Tobacco Never    Goals Met:  Independence with exercise equipment Exercise tolerated well No report of concerns or symptoms today Strength training completed today  Goals Unmet:  Not Applicable  Comments: check out 1030   Dr. Dina Rich is Medical Director for [facility] Cardiac Rehab

## 2022-12-13 ENCOUNTER — Encounter (HOSPITAL_COMMUNITY)
Admission: RE | Admit: 2022-12-13 | Discharge: 2022-12-13 | Disposition: A | Discharge status: Home / Self Care | Payer: Medicare Other | Source: Ambulatory Visit | Attending: Internal Medicine | Admitting: Internal Medicine

## 2022-12-13 DIAGNOSIS — I5022 Chronic systolic (congestive) heart failure: Secondary | ICD-10-CM | POA: Diagnosis not present

## 2022-12-13 NOTE — Progress Notes (Signed)
Daily Session Note  Patient Details  Name: AMBELLINA TITO MRN: [id number] Date of Birth: Mar 24, 1956 Referring Provider:   Flowsheet Row CARDIAC REHAB PHASE II ORIENTATION from 11/16/2022 in [facility] CARDIAC REHABILITATION  Referring Provider Dr. Jenene Slicker       Encounter Date: 12/13/2022  Check In:  Session Check In - 12/13/22 0934       Check-In   Supervising physician immediately available to respond to emergencies [facility] MD immediately available    Physician(s) Dr. Diona Browner    Location [facility]    Staff Present Ross Ludwig, BS, Exercise Physiologist;Hillary Troutman BSN, RN;Cindi Ghazarian Laural Benes, RN, BSN    Virtual Visit No    Medication changes reported     No    Fall or balance concerns reported    No    Tobacco Cessation No Change    Warm-up and Cool-down Performed as group-led instruction    VAD Patient? No    PAD/SET Patient? No      Pain Assessment   Currently in Pain? No/denies    Pain Score 0-No pain    Multiple Pain Sites No             Capillary Blood Glucose: No results found for this or any previous visit (from the past 24 hour(s)).    Social History   Tobacco Use  Smoking Status Never  Smokeless Tobacco Never    Goals Met:  Independence with exercise equipment Exercise tolerated well No report of concerns or symptoms today Strength training completed today  Goals Unmet:  Not Applicable  Comments: Check out 1030.   Dr. Dina Rich is Medical Director for [facility] Cardiac Rehab

## 2022-12-14 ENCOUNTER — Telehealth: Payer: Self-pay | Admitting: Internal Medicine

## 2022-12-14 NOTE — Telephone Encounter (Signed)
Patient would like to get results of recent blood work.

## 2022-12-14 NOTE — Telephone Encounter (Signed)
Lab results given to patient. States that she is in the donut hole with her insurance and will not be able to afford the high cost of farxiga and entresto. Informed patient that she can apply for patient assistance and if approved, she can get these medications from the company free of charge. States that she does not think that she will be approved but it will be worth a try. States that when she is due for a refill, she will contact the office regarding patient assistance.

## 2022-12-15 ENCOUNTER — Encounter (HOSPITAL_COMMUNITY)
Admission: RE | Admit: 2022-12-15 | Discharge: 2022-12-15 | Disposition: A | Discharge status: Home / Self Care | Payer: Medicare Other | Source: Ambulatory Visit | Attending: Internal Medicine | Admitting: Internal Medicine

## 2022-12-15 DIAGNOSIS — I5022 Chronic systolic (congestive) heart failure: Secondary | ICD-10-CM

## 2022-12-15 NOTE — Progress Notes (Signed)
Daily Session Note  Patient Details  Name: Raven Green MRN: [id number] Date of Birth: Jan 26, 1956 Referring Provider:   Flowsheet Row CARDIAC REHAB PHASE II ORIENTATION from 11/16/2022 in [facility] CARDIAC REHABILITATION  Referring Provider Dr. Jenene Slicker       Encounter Date: 12/15/2022  Check In:  Session Check In - 12/15/22 0930       Check-In   Supervising physician immediately available to respond to emergencies [facility] immediately available    Physician(s) Dr. Dina Rich    Location [facility]    Staff Present Ross Ludwig, BS, Exercise Physiologist;Anae Hams Laural Benes, RN, BSN;Other   Kary Kos   Virtual Visit No    Medication changes reported     No    Fall or balance concerns reported    No    Tobacco Cessation No Change    Warm-up and Cool-down Performed as group-led instruction    Resistance Training Performed Yes    VAD Patient? No    PAD/SET Patient? No      Pain Assessment   Currently in Pain? No/denies    Pain Score 0-No pain    Multiple Pain Sites No             Capillary Blood Glucose: No results found for this or any previous visit (from the past 24 hour(s)).    Social History   Tobacco Use  Smoking Status Never  Smokeless Tobacco Never    Goals Met:  Independence with exercise equipment Exercise tolerated well No report of concerns or symptoms today Strength training completed today  Goals Unmet:  Not Applicable  Comments: Check out 1030.   Dr. Dina Rich is Medical Director for [facility] Cardiac Rehab

## 2022-12-18 ENCOUNTER — Encounter (HOSPITAL_COMMUNITY)
Admission: RE | Admit: 2022-12-18 | Discharge: 2022-12-18 | Disposition: A | Discharge status: Home / Self Care | Payer: Medicare Other | Source: Ambulatory Visit | Attending: Internal Medicine | Admitting: Internal Medicine

## 2022-12-18 VITALS — Wt 218.3 lb

## 2022-12-18 DIAGNOSIS — I5022 Chronic systolic (congestive) heart failure: Secondary | ICD-10-CM | POA: Diagnosis not present

## 2022-12-18 NOTE — Progress Notes (Signed)
Daily Session Note  Patient Details  Name: Raven Green MRN: [id number] Date of Birth: 06/17/1956 Referring Provider:   Flowsheet Row CARDIAC REHAB PHASE II ORIENTATION from 11/16/2022 in [facility] CARDIAC REHABILITATION  Referring Provider Dr. Jenene Slicker       Encounter Date: 12/18/2022  Check In:  Session Check In - 12/18/22 0930       Check-In   Supervising physician immediately available to respond to emergencies [facility] immediately available    Physician(s) Dr. Jenene Slicker    Location [facility]    Staff Present Ross Ludwig, BS, Exercise Physiologist;Debra Laural Benes, RN, BSN;Other    Virtual Visit No    Medication changes reported     No    Fall or balance concerns reported    No    Tobacco Cessation No Change    Warm-up and Cool-down Performed as group-led instruction    Resistance Training Performed Yes    VAD Patient? No    PAD/SET Patient? No      Pain Assessment   Currently in Pain? No/denies    Pain Score 0-No pain             Capillary Blood Glucose: No results found for this or any previous visit (from the past 24 hour(s)).    Social History   Tobacco Use  Smoking Status Never  Smokeless Tobacco Never    Goals Met:  Independence with exercise equipment Exercise tolerated well No report of concerns or symptoms today Strength training completed today  Goals Unmet:  Not Applicable  Comments: check out 1030   Dr. Dina Rich is Medical Director for [facility] Cardiac Rehab

## 2022-12-20 ENCOUNTER — Encounter (HOSPITAL_COMMUNITY)
Admission: RE | Admit: 2022-12-20 | Discharge: 2022-12-20 | Disposition: A | Discharge status: Home / Self Care | Payer: Medicare Other | Source: Ambulatory Visit | Attending: Internal Medicine | Admitting: Internal Medicine

## 2022-12-20 DIAGNOSIS — I5022 Chronic systolic (congestive) heart failure: Secondary | ICD-10-CM

## 2022-12-20 NOTE — Progress Notes (Signed)
Daily Session Note  Patient Details  Name: ARDELIA WREDE MRN: [id number] Date of Birth: Sep 28, 1955 Referring Provider:   Flowsheet Row CARDIAC REHAB PHASE II ORIENTATION from 11/16/2022 in [facility] CARDIAC REHABILITATION  Referring Provider Dr. Jenene Slicker       Encounter Date: 12/20/2022  Check In:  Session Check In - 12/20/22 0930       Check-In   Supervising physician immediately available to respond to emergencies [facility] immediately available    Physician(s) Dr. Jenene Slicker    Location [facility]    Staff Present Ross Ludwig, BS, Exercise Physiologist;Shaquila Sigman Laural Benes, RN, BSN;Christy Edwards, RN, BSN    Virtual Visit No    Medication changes reported     No    Fall or balance concerns reported    No    Tobacco Cessation No Change    Warm-up and Cool-down Performed as group-led Writer Performed Yes    VAD Patient? No    PAD/SET Patient? No      Pain Assessment   Currently in Pain? No/denies    Pain Score 0-No pain    Multiple Pain Sites No             Capillary Blood Glucose: No results found for this or any previous visit (from the past 24 hour(s)).    Social History   Tobacco Use  Smoking Status Never  Smokeless Tobacco Never    Goals Met:  Independence with exercise equipment Exercise tolerated well No report of concerns or symptoms today Strength training completed today  Goals Unmet:  Not Applicable  Comments: Check out 1030.   Dr. Dina Rich is Medical Director for [facility] Cardiac Rehab

## 2022-12-20 NOTE — Progress Notes (Signed)
Cardiac Individual Treatment Plan  Patient Details  Name: UBAH RADKE MRN: [id number] Date of Birth: 1955/09/04 Referring Provider:   Flowsheet Row CARDIAC REHAB PHASE II ORIENTATION from 11/16/2022 in [facility] CARDIAC REHABILITATION  Referring Provider Dr. Jenene Slicker       Initial Encounter Date:  Flowsheet Row CARDIAC REHAB PHASE II ORIENTATION from 11/16/2022 in [place] CARDIAC REHABILITATION  Date 11/16/22       Visit Diagnosis: Heart failure, chronic systolic  Patient's Home Medications on Admission:  Current Outpatient Medications:    dapagliflozin propanediol (FARXIGA) 10 MG TABS tablet, Take 1 tablet (10 mg total) by mouth daily before breakfast., Disp: 30 tablet, Rfl: 6   furosemide (LASIX) 40 MG tablet, Take 1 tablet (40 mg total) by mouth daily., Disp: 30 tablet, Rfl: 11   linaGLIPtin-metFORMIN HCl ER (JENTADUETO XR) 12-998 MG TB24, Take 1 tablet by mouth daily at 6 PM., Disp: , Rfl:    metoprolol tartrate (LOPRESSOR) 25 MG tablet, Take 1 tablet (25 mg total) by mouth 2 (two) times daily., Disp: 180 tablet, Rfl: 3   nitroGLYCERIN (NITROSTAT) 0.4 MG SL tablet, Place 1 tablet (0.4 mg total) under the tongue every 5 (five) minutes as needed for chest pain., Disp: 25 tablet, Rfl: 3   potassium chloride SA (KLOR-CON M) 20 MEQ tablet, Take 2 tablets (40 mEq total) by mouth 2 (two) times daily for 3 days, THEN 1 tablet (20 mEq total) 2 (two) times daily. (Patient taking differently: Take 1 tablet (20 mEq total) 2 (two) times daily.), Disp: 180 tablet, Rfl: 3   sacubitril-valsartan (ENTRESTO) 49-51 MG, Take 1 tablet by mouth 2 (two) times daily., Disp: 60 tablet, Rfl: 6   spironolactone (ALDACTONE) 25 MG tablet, Take 1 tablet (25 mg total) by mouth daily., Disp: 90 tablet, Rfl: 1   TOUJEO MAX SOLOSTAR 300 UNIT/ML Solostar Pen, Inject 44 Units into the skin daily at 10 pm., Disp: , Rfl:   Past Medical History: Past Medical History:  Diagnosis Date   Arthritis    Diabetes  mellitus without complication    gestational   Endometriosis    History of migraine headaches    Hypertension    IBS (irritable bowel syndrome)     Tobacco Use: Social History   Tobacco Use  Smoking Status Never  Smokeless Tobacco Never    Labs: Review Flowsheet       Latest Ref Rng & Units 08/13/2013 10/12/2022  Labs for ITP Cardiac and Pulmonary Rehab  PH, Arterial 7.35 - 7.45 - 7.394   PCO2 arterial 32 - 48 mmHg - 45.4   Bicarbonate 20.0 - 28.0 mmol/L - 27.7  28.4  27.8   TCO2 22 - 32 mmol/L 28  29  30  29    O2 Saturation % - 95  72  72     Capillary Blood Glucose: Lab Results  Component Value Date   GLUCAP 155 (H) 11/16/2022   GLUCAP 147 (H) 04/20/2021   GLUCAP 136 (H) 08/13/2013    POCT Glucose     Row Name 11/20/22 1039             POCT Blood Glucose   Pre-Exercise 213 mg/dL                Exercise Target Goals: Exercise Program Goal: Individual exercise prescription set using results from initial 6 min walk test and THRR while considering  patient's activity barriers and safety.   Exercise Prescription Goal: Starting with aerobic activity 30 plus  minutes a day, 3 days per week for initial exercise prescription. Provide home exercise prescription and guidelines that participant acknowledges understanding prior to discharge.  Activity Barriers & Risk Stratification:  Activity Barriers & Cardiac Risk Stratification - 11/16/22 1323       Activity Barriers & Cardiac Risk Stratification   Activity Barriers Arthritis;Back Problems;Neck/Spine Problems;Joint Problems;Deconditioning;Decreased Ventricular Function    Cardiac Risk Stratification High             6 Minute Walk:  6 Minute Walk     Row Name 11/16/22 1316 12/04/22 1241       6 Minute Walk   Phase Initial Initial    Distance 1100 feet --    Walk Time 6 minutes --    # of Rest Breaks 0 --    MPH 2.08 --    METS 2.54 --    RPE 12 --    VO2 Peak 8.89 --    Symptoms No --     Resting HR 75 bpm --    Resting BP 112/60 --    Resting Oxygen Saturation  95 % --    Exercise Oxygen Saturation  during 6 min walk 96 % --    Max Ex. HR 98 bpm --    Max Ex. BP 140/60 --    2 Minute Post BP 120/60 --             Oxygen Initial Assessment:   Oxygen Re-Evaluation:   Oxygen Discharge (Final Oxygen Re-Evaluation):   Initial Exercise Prescription:  Initial Exercise Prescription - 11/16/22 1300       Date of Initial Exercise RX and Referring Provider   Date 11/16/22    Referring Provider Dr. Jenene Slicker    Expected Discharge Date 02/16/23      NuStep   Level 1    SPM 80    Minutes 39      Prescription Details   Frequency (times per week) 3    Duration Progress to 30 minutes of continuous aerobic without signs/symptoms of physical distress      Intensity   THRR 40-80% of Max Heartrate 63-123    Ratings of Perceived Exertion 11-13      Resistance Training   Training Prescription Yes    Weight 0    Reps 10-15             Perform Capillary Blood Glucose checks as needed.  Exercise Prescription Changes:   Exercise Prescription Changes     Row Name 11/20/22 1000 12/04/22 1200 12/18/22 1300         Response to Exercise   Blood Pressure (Admit) 112/60 126/70 124/60     Blood Pressure (Exercise) 132/74 142/66 126/58     Blood Pressure (Exit) 112/62 124/60 114/60     Heart Rate (Admit) 85 bpm 73 bpm 79 bpm     Heart Rate (Exercise) 98 bpm 101 bpm 98 bpm     Heart Rate (Exit) 95 bpm 82 bpm 88 bpm     Rating of Perceived Exertion (Exercise) 12 12 12      Duration Continue with 30 min of aerobic exercise without signs/symptoms of physical distress. Continue with 30 min of aerobic exercise without signs/symptoms of physical distress. Continue with 30 min of aerobic exercise without signs/symptoms of physical distress.     Intensity THRR unchanged THRR unchanged THRR unchanged       Progression   Progression -- Continue to progress workloads to  maintain intensity without signs/symptoms  of physical distress. Continue to progress workloads to maintain intensity without signs/symptoms of physical distress.       Resistance Training   Training Prescription Yes Yes Yes     Weight Reps 10-15 10-15 10-15     Time 10 Minutes 10 Minutes 10 Minutes       NuStep   Level SPM 80 94 100     Minutes 39 39 39     METs 1.96 2.23 2.23              Exercise Comments:   Exercise Goals and Review:   Exercise Goals     Row Name 11/16/22 1319 11/20/22 1041 12/18/22 1335         Exercise Goals   Increase Physical Activity Yes Yes Yes     Intervention Provide advice, education, support and counseling about physical activity/exercise needs.;Develop an individualized exercise prescription for aerobic and resistive training based on initial evaluation findings, risk stratification, comorbidities and participant's personal goals. Provide advice, education, support and counseling about physical activity/exercise needs.;Develop an individualized exercise prescription for aerobic and resistive training based on initial evaluation findings, risk stratification, comorbidities and participant's personal goals. Provide advice, education, support and counseling about physical activity/exercise needs.;Develop an individualized exercise prescription for aerobic and resistive training based on initial evaluation findings, risk stratification, comorbidities and participant's personal goals.     Expected Outcomes Short Term: Attend rehab on a regular basis to increase amount of physical activity.;Long Term: Add in home exercise to make exercise part of routine and to increase amount of physical activity.;Long Term: Exercising regularly at least 3-5 days a week. Short Term: Attend rehab on a regular basis to increase amount of physical activity.;Long Term: Add in home exercise to make exercise part of routine and to increase amount of physical  activity.;Long Term: Exercising regularly at least 3-5 days a week. Short Term: Attend rehab on a regular basis to increase amount of physical activity.;Long Term: Add in home exercise to make exercise part of routine and to increase amount of physical activity.;Long Term: Exercising regularly at least 3-5 days a week.     Increase Strength and Stamina Yes Yes Yes     Intervention Provide advice, education, support and counseling about physical activity/exercise needs.;Develop an individualized exercise prescription for aerobic and resistive training based on initial evaluation findings, risk stratification, comorbidities and participant's personal goals. Provide advice, education, support and counseling about physical activity/exercise needs.;Develop an individualized exercise prescription for aerobic and resistive training based on initial evaluation findings, risk stratification, comorbidities and participant's personal goals. Provide advice, education, support and counseling about physical activity/exercise needs.;Develop an individualized exercise prescription for aerobic and resistive training based on initial evaluation findings, risk stratification, comorbidities and participant's personal goals.     Expected Outcomes Short Term: Increase workloads from initial exercise prescription for resistance, speed, and METs.;Short Term: Perform resistance training exercises routinely during rehab and add in resistance training at home;Long Term: Improve cardiorespiratory fitness, muscular endurance and strength as measured by increased METs and functional capacity ( ) Short Term: Increase workloads from initial exercise prescription for resistance, speed, and METs.;Short Term: Perform resistance training exercises routinely during rehab and add in resistance training at home;Long Term: Improve cardiorespiratory fitness, muscular endurance and strength as measured by increased METs and functional capacity ( )  Short Term: Increase workloads from initial exercise prescription for resistance, speed, and METs.;Short Term: Perform resistance training  exercises routinely during rehab and add in resistance training at home;Long Term: Improve cardiorespiratory fitness, muscular endurance and strength as measured by increased METs and functional capacity ( )     Able to understand and use rate of perceived exertion (RPE) scale Yes Yes Yes     Intervention Provide education and explanation on how to use RPE scale Provide education and explanation on how to use RPE scale Provide education and explanation on how to use RPE scale     Expected Outcomes Short Term: Able to use RPE daily in rehab to express subjective intensity level;Long Term:  Able to use RPE to guide intensity level when exercising independently Short Term: Able to use RPE daily in rehab to express subjective intensity level;Long Term:  Able to use RPE to guide intensity level when exercising independently Short Term: Able to use RPE daily in rehab to express subjective intensity level;Long Term:  Able to use RPE to guide intensity level when exercising independently     Knowledge and understanding of Target Heart Rate Range (THRR) Yes Yes Yes     Intervention Provide education and explanation of THRR including how the numbers were predicted and where they are located for reference Provide education and explanation of THRR including how the numbers were predicted and where they are located for reference Provide education and explanation of THRR including how the numbers were predicted and where they are located for reference     Expected Outcomes Short Term: Able to state/look up THRR;Short Term: Able to use daily as guideline for intensity in rehab;Long Term: Able to use THRR to govern intensity when exercising independently Short Term: Able to state/look up THRR;Short Term: Able to use daily as guideline for intensity in rehab;Long Term: Able to use THRR  to govern intensity when exercising independently Short Term: Able to state/look up THRR;Short Term: Able to use daily as guideline for intensity in rehab;Long Term: Able to use THRR to govern intensity when exercising independently     Able to check pulse independently Yes Yes Yes     Intervention Provide education and demonstration on how to check pulse in carotid and radial arteries.;Review the importance of being able to check your own pulse for safety during independent exercise Provide education and demonstration on how to check pulse in carotid and radial arteries.;Review the importance of being able to check your own pulse for safety during independent exercise Provide education and demonstration on how to check pulse in carotid and radial arteries.;Review the importance of being able to check your own pulse for safety during independent exercise     Expected Outcomes Short Term: Able to explain why pulse checking is important during independent exercise;Long Term: Able to check pulse independently and accurately Short Term: Able to explain why pulse checking is important during independent exercise;Long Term: Able to check pulse independently and accurately Short Term: Able to explain why pulse checking is important during independent exercise;Long Term: Able to check pulse independently and accurately     Understanding of Exercise Prescription Yes Yes Yes     Intervention Provide education, explanation, and written materials on patient's individual exercise prescription Provide education, explanation, and written materials on patient's individual exercise prescription Provide education, explanation, and written materials on patient's individual exercise prescription     Expected Outcomes Short Term: Able to explain program exercise prescription;Long Term: Able to explain home exercise prescription to exercise independently Short Term: Able to explain program exercise prescription;Long Term: Able to  explain home  exercise prescription to exercise independently Short Term: Able to explain program exercise prescription;Long Term: Able to explain home exercise prescription to exercise independently              Exercise Goals Re-Evaluation :  Exercise Goals Re-Evaluation     Row Name 11/20/22 1043 12/18/22 1335           Exercise Goal Re-Evaluation   Exercise Goals Review Increase Physical Activity;Increase Strength and Stamina;Able to understand and use rate of perceived exertion (RPE) scale;Knowledge and understanding of Target Heart Rate Range (THRR);Able to check pulse independently;Understanding of Exercise Prescription Increase Physical Activity;Increase Strength and Stamina;Able to understand and use rate of perceived exertion (RPE) scale;Knowledge and understanding of Target Heart Rate Range (THRR);Able to check pulse independently;Understanding of Exercise Prescription      Comments Pt has completed 1 exercise session of CR. She is limited by what she described as muscle pain/stiffness that she attributes to Lasix. She has been very sedentary over the past couple of months. She exercised at 1.96 METs on the stepper during her first session. Will continue to monitor and progress as able. Pt has completed 12 sessions of cardiac rehab. She still continues to be limited due to muscle pain/stiffness that she attribytes to Lasix. She is progressing slow due to being deconditioned. She has increased her SPM on the stepper and currently exercising at  2.23 METs on the stepper. WIll continue to monitor and progress as able.      Expected Outcomes Through exercise at rehab and at home, the patient will meet their stated goals. Through exercise at rehab and at home, the patient will meet their stated goals.                Discharge Exercise Prescription (Final Exercise Prescription Changes):  Exercise Prescription Changes - 12/18/22 1300       Response to Exercise   Blood Pressure  (Admit) 124/60    Blood Pressure (Exercise) 126/58    Blood Pressure (Exit) 114/60    Heart Rate (Admit) 79 bpm    Heart Rate (Exercise) 98 bpm    Heart Rate (Exit) 88 bpm    Rating of Perceived Exertion (Exercise) 12    Duration Continue with 30 min of aerobic exercise without signs/symptoms of physical distress.    Intensity THRR unchanged      Progression   Progression Continue to progress workloads to maintain intensity without signs/symptoms of physical distress.      Resistance Training   Training Prescription Yes    Weight 2    Reps 10-15    Time 10 Minutes      NuStep   Level 1    SPM 100    Minutes 39    METs 2.23             Nutrition:  Target Goals: Understanding of nutrition guidelines, daily intake of sodium 1500mg , cholesterol 200mg , calories 30% from fat and 7% or less from saturated fats, daily to have 5 or more servings of fruits and vegetables.  Biometrics:  Pre Biometrics - 11/16/22 1406       Pre Biometrics   Height 5\' 4"  (1.626 m)    Weight 100 kg    BMI (Calculated) 37.82              Nutrition Therapy Plan and Nutrition Goals:  Nutrition Therapy & Goals - 11/16/22 1425       Personal Nutrition Goals   Comments Patient scored 56  on diet assessment. We provide educational sessions on heart healthy nutrition with handouts and assistance with RD referral if patient is interested.      Intervention Plan   Intervention Nutrition handout(s) given to patient.    Expected Outcomes Short Term Goal: Understand basic principles of dietary content, such as calories, fat, sodium, cholesterol and nutrients.             Nutrition Assessments:  Nutrition Assessments - 11/16/22 1330       MEDFICTS Scores   Pre Score 56            MEDIFICTS Score Key: ?70 Need to make dietary changes  40-70 Heart Healthy Diet ? 40 Therapeutic Level Cholesterol Diet   Picture Your Plate Scores: <16 Unhealthy dietary pattern with much room for  improvement. 41-50 Dietary pattern unlikely to meet recommendations for good health and room for improvement. 51-60 More healthful dietary pattern, with some room for improvement.  >60 Healthy dietary pattern, although there may be some specific behaviors that could be improved.    Nutrition Goals Re-Evaluation:   Nutrition Goals Discharge (Final Nutrition Goals Re-Evaluation):   Psychosocial: Target Goals: Acknowledge presence or absence of significant depression and/or stress, maximize coping skills, provide positive support system. Participant is able to verbalize types and ability to use techniques and skills needed for reducing stress and depression.  Initial Review & Psychosocial Screening:  Initial Psych Review & Screening - 11/16/22 1328       Initial Review   Current issues with None Identified      Family Dynamics   Good Support System? Yes    Comments pt's husband      Barriers   Psychosocial barriers to participate in program There are no identifiable barriers or psychosocial needs.      Screening Interventions   Interventions Encouraged to exercise;Provide feedback about the scores to participant;Other (comment)    Expected Outcomes Short Term goal: Identification and review with participant of any Quality of Life or Depression concerns found by scoring the questionnaire.;Long Term goal: The participant improves quality of Life and PHQ9 Scores as seen by post scores and/or verbalization of changes             Quality of Life Scores:  Quality of Life - 11/16/22 1333       Quality of Life   Select Quality of Life      Quality of Life Scores   Health/Function Pre 14.5 %    Socioeconomic Pre 30 %    Psych/Spiritual Pre 19.71 %    Family Pre 21.6 %    GLOBAL Pre 19.5 %            Scores of 19 and below usually indicate a poorer quality of life in these areas.  A difference of  2-3 points is a clinically meaningful difference.  A difference of 2-3  points in the total score of the Quality of Life Index has been associated with significant improvement in overall quality of life, self-image, physical symptoms, and general health in studies assessing change in quality of life.  PHQ-9: Review Flowsheet       11/16/2022  Depression screen PHQ 2/9  Decreased Interest 1  Down, Depressed, Hopeless 0  PHQ - 2 Score 1  Altered sleeping 1  Tired, decreased energy 3  Change in appetite 2  Feeling bad or failure about yourself  0  Trouble concentrating 0  Moving slowly or fidgety/restless 0  Suicidal thoughts 0  PHQ-9 Score 7  Difficult doing work/chores Somewhat difficult   Interpretation of Total Score  Total Score Depression Severity:  1-4 = Minimal depression, 5-9 = Mild depression, 10-14 = Moderate depression, 15-19 = Moderately severe depression, 20-27 = Severe depression   Psychosocial Evaluation and Intervention:  Psychosocial Evaluation - 11/16/22 1447       Psychosocial Evaluation & Interventions   Interventions Stress management education;Relaxation education;Encouraged to exercise with the program and follow exercise prescription    Comments Patient has no identifiable psychosocial issues.  She has a good support system with her husband.  She has no other family in this area since she and her husband left [place] for her husband's job 40 years ago.  She scored a 7 on her PHQ-9, which relates to her fatigue, overeating, and trouble sleeping when she takes Lasix.  She is currently taking Lasix every few days, since the medication causes muscle aches in her legs and buttocks.  Her goals are to lose weight, increase her strength and endurance, and get back to "feeling normal."  We will continue to monitor her progress toward meeting these goals.    Expected Outcomes The pt will continue to have no identifiable psychosocial issues.    Continue Psychosocial Services  No Follow up required             Psychosocial  Re-Evaluation:  Psychosocial Re-Evaluation     Row Name 11/20/22 1254 12/11/22 1418           Psychosocial Re-Evaluation   Current issues with None Identified None Identified      Comments Patient is new to the program. She has completed 1 session. She continues to have no psychosocial barriers identified. We will continue to monitor her progress in the program. Patient has completed 8 session. She continues to have no psychosocial barriers identified. She enjoys the sessions and demonstrates an interest in improving her health. We will continue to monitor her progress in the program.      Expected Outcomes Patient will continue to have no psychosocial barriers identified. Patient will continue to have no psychosocial barriers identified.      Interventions Relaxation education;Stress management education;Encouraged to attend Cardiac Rehabilitation for the exercise Relaxation education;Stress management education;Encouraged to attend Cardiac Rehabilitation for the exercise      Continue Psychosocial Services  No Follow up required No Follow up required               Psychosocial Discharge (Final Psychosocial Re-Evaluation):  Psychosocial Re-Evaluation - 12/11/22 1418       Psychosocial Re-Evaluation   Current issues with None Identified    Comments Patient has completed 8 session. She continues to have no psychosocial barriers identified. She enjoys the sessions and demonstrates an interest in improving her health. We will continue to monitor her progress in the program.    Expected Outcomes Patient will continue to have no psychosocial barriers identified.    Interventions Relaxation education;Stress management education;Encouraged to attend Cardiac Rehabilitation for the exercise    Continue Psychosocial Services  No Follow up required             Vocational Rehabilitation: Provide vocational rehab assistance to qualifying candidates.   Vocational Rehab Evaluation &  Intervention:  Vocational Rehab - 11/16/22 1251       Initial Vocational Rehab Evaluation & Intervention   Assessment shows need for Vocational Rehabilitation No      Vocational Rehab Re-Evaulation   Comments retired  Education: Education Goals: Education classes will be provided on a weekly basis, covering required topics. Participant will state understanding/return demonstration of topics presented.  Learning Barriers/Preferences:  Learning Barriers/Preferences - 11/16/22 1332       Learning Barriers/Preferences   Learning Barriers None    Learning Preferences Group Instruction             Education Topics: Hypertension, Hypertension Reduction -Define heart disease and high blood pressure. Discus how high blood pressure affects the body and ways to reduce high blood pressure. Flowsheet Row CARDIAC REHAB PHASE II EXERCISE from 12/20/2022 in [place] CARDIAC REHABILITATION  Date 12/06/22  Educator DM  Instruction Review Code 2- Demonstrated Understanding       Exercise and Your Heart -Discuss why it is important to exercise, the FITT principles of exercise, normal and abnormal responses to exercise, and how to exercise safely. Flowsheet Row CARDIAC REHAB PHASE II EXERCISE from 12/20/2022 in [place] CARDIAC REHABILITATION  Date 12/13/22  Educator HB  Instruction Review Code 1- Verbalizes Understanding       Angina -Discuss definition of angina, causes of angina, treatment of angina, and how to decrease risk of having angina. Flowsheet Row CARDIAC REHAB PHASE II EXERCISE from 12/20/2022 in [place] CARDIAC REHABILITATION  Date 12/20/22  Educator HB  Instruction Review Code 1- Verbalizes Understanding       Cardiac Medications -Review what the following cardiac medications are used for, how they affect the body, and side effects that may occur when taking the medications.  Medications include Aspirin, Beta blockers, calcium channel  blockers, ACE Inhibitors, angiotensin receptor blockers, diuretics, digoxin, and antihyperlipidemics.   Congestive Heart Failure -Discuss the definition of CHF, how to live with CHF, the signs and symptoms of CHF, and how keep track of weight and sodium intake.   Heart Disease and Intimacy -Discus the effect sexual activity has on the heart, how changes occur during intimacy as we age, and safety during sexual activity.   Smoking Cessation / COPD -Discuss different methods to quit smoking, the health benefits of quitting smoking, and the definition of COPD.   Nutrition I: Fats -Discuss the types of cholesterol, what cholesterol does to the heart, and how cholesterol levels can be controlled.   Nutrition II: Labels -Discuss the different components of food labels and how to read food label   Heart Parts/Heart Disease and PAD -Discuss the anatomy of the heart, the pathway of blood circulation through the heart, and these are affected by heart disease.   Stress I: Signs and Symptoms -Discuss the causes of stress, how stress may lead to anxiety and depression, and ways to limit stress.   Stress II: Relaxation -Discuss different types of relaxation techniques to limit stress. Flowsheet Row CARDIAC REHAB PHASE II EXERCISE from 12/20/2022 in [place] CARDIAC REHABILITATION  Date 11/22/22  Educator HB  Instruction Review Code 2- Demonstrated Understanding       Warning Signs of Stroke / TIA -Discuss definition of a stroke, what the signs and symptoms are of a stroke, and how to identify when someone is having stroke. Flowsheet Row CARDIAC REHAB PHASE II EXERCISE from 12/20/2022 in [place] CARDIAC REHABILITATION  Date 11/29/22  Educator HB  Instruction Review Code 1- Verbalizes Understanding       Knowledge Questionnaire Score:  Knowledge Questionnaire Score - 11/16/22 1333       Knowledge Questionnaire Score   Pre Score 21/24  Core Components/Risk  Factors/Patient Goals at Admission:  Personal Goals and Risk Factors at Admission - 11/16/22 1336       Core Components/Risk Factors/Patient Goals on Admission    Weight Management Weight Loss;Yes    Intervention Weight Management: Develop a combined nutrition and exercise program designed to reach desired caloric intake, while maintaining appropriate intake of nutrient and fiber, sodium and fats, and appropriate energy expenditure required for the weight goal.;Weight Management: Provide education and appropriate resources to help participant work on and attain dietary goals.;Weight Management/Obesity: Establish reasonable short term and long term weight goals.;Obesity: Provide education and appropriate resources to help participant work on and attain dietary goals.    Expected Outcomes Understanding of distribution of calorie intake throughout the day with the consumption of 4-5 meals/snacks;Understanding recommendations for meals to include 15-35% energy as protein, 25-35% energy from fat, 35-60% energy from carbohydrates, less than 200mg  of dietary cholesterol, 20-35 gm of total fiber daily;Weight Loss: Understanding of general recommendations for a balanced deficit meal plan, which promotes 1-2 lb weight loss per week and includes a negative energy balance of [phone] kcal/d;Weight Maintenance: Understanding of the daily nutrition guidelines, which includes 25-35% calories from fat, 7% or less cal from saturated fats, less than 200mg  cholesterol, less than 1.5gm of sodium, & 5 or more servings of fruits and vegetables daily;Long Term: Adherence to nutrition and physical activity/exercise program aimed toward attainment of established weight goal;Short Term: Continue to assess and modify interventions until short term weight is achieved    Improve shortness of breath with ADL's Yes    Intervention Provide education, individualized exercise plan and daily activity instruction to help decrease symptoms of  SOB with activities of daily living.    Expected Outcomes Short Term: Improve cardiorespiratory fitness to achieve a reduction of symptoms when performing ADLs;Long Term: Be able to perform more ADLs without symptoms or delay the onset of symptoms    Diabetes Yes    Intervention Provide education about signs/symptoms and action to take for hypo/hyperglycemia.;Provide education about proper nutrition, including hydration, and aerobic/resistive exercise prescription along with prescribed medications to achieve blood glucose in normal ranges: Fasting glucose 65-99 mg/dL    Expected Outcomes Short Term: Participant verbalizes understanding of the signs/symptoms and immediate care of hyper/hypoglycemia, proper foot care and importance of medication, aerobic/resistive exercise and nutrition plan for blood glucose control.;Long Term: Attainment of HbA1C < 7%.    Heart Failure Yes    Intervention Provide a combined exercise and nutrition program that is supplemented with education, support and counseling about heart failure. Directed toward relieving symptoms such as shortness of breath, decreased exercise tolerance, and extremity edema.    Expected Outcomes Improve functional capacity of life;Short term: Attendance in program 2-3 days a week with increased exercise capacity. Reported lower sodium intake. Reported increased fruit and vegetable intake. Reports medication compliance.;Short term: Daily weights obtained and reported for increase. Utilizing diuretic protocols set by physician.;Long term: Adoption of self-care skills and reduction of barriers for early signs and symptoms recognition and intervention leading to self-care maintenance.    Hypertension Yes    Intervention Provide education on lifestyle modifcations including regular physical activity/exercise, weight management, moderate sodium restriction and increased consumption of fresh fruit, vegetables, and low fat dairy, alcohol moderation, and  smoking cessation.;Monitor prescription use compliance.    Expected Outcomes Short Term: Continued assessment and intervention until BP is < 140/77mm HG in hypertensive participants. < 130/64mm HG in hypertensive participants with diabetes, heart failure or chronic  kidney disease.;Long Term: Maintenance of blood pressure at goal levels.    Personal Goal Other Yes    Personal Goal Get to feeling "back to normal", increase strength and endurance.    Intervention Pt will attend CR program 3 days a week and begin a home exercise program.    Expected Outcomes Pt will meet both her personal and program goals.             Core Components/Risk Factors/Patient Goals Review:   Goals and Risk Factor Review     Row Name 11/20/22 1256 12/11/22 1419           Core Components/Risk Factors/Patient Goals Review   Personal Goals Review Weight Management/Obesity;Improve shortness of breath with ADL's;Diabetes;Hypertension;Heart Failure;Other Weight Management/Obesity;Improve shortness of breath with ADL's;Diabetes;Hypertension;Heart Failure;Other      Review Patient was referred to CR with chronic systolic HF. She has multiple risk factors for CAD and is participating in the program for risk modification. She has completed 1 sessions. Her current weight is 218.4 lbs. Her DM is managed with [place]; [place] and [place].  She has no A1C on file. Her personal goals for the program are to lose weight; increase her strength and endurance and get back to feeling normal. We will continue to monitor her progress as she works towards meeting these goals. Patient has completed 8 sessions. Her current weight is 220.0 lbs up 1.6 lbs since last 30 day review.  She is doing well in the program with consistent attendance and progressions. Her DM continues to be managed with [place]; [place] and [place]. She has no A1C on file. She saw Dr. Jenene Slicker 4/9 for a routine check. She increased her Entresto and added  Spironolactone 25 mg daily. Her blood pressure is at goal. Her personal goals for the program are to lose weight; increase her strength and endurance and get back to feeling normal. We will continue to monitor her progress as she works towards meeting these goals.      Expected Outcomes Patient will complete the program meeting both personal and program goals. Patient will complete the program meeting both personal and program goals.               Core Components/Risk Factors/Patient Goals at Discharge (Final Review):   Goals and Risk Factor Review - 12/11/22 1419       Core Components/Risk Factors/Patient Goals Review   Personal Goals Review Weight Management/Obesity;Improve shortness of breath with ADL's;Diabetes;Hypertension;Heart Failure;Other    Review Patient has completed 8 sessions. Her current weight is 220.0 lbs up 1.6 lbs since last 30 day review.  She is doing well in the program with consistent attendance and progressions. Her DM continues to be managed with [place]; [place] and [place]. She has no A1C on file. She saw Dr. Jenene Slicker 4/9 for a routine check. She increased her Entresto and added Spironolactone 25 mg daily. Her blood pressure is at goal. Her personal goals for the program are to lose weight; increase her strength and endurance and get back to feeling normal. We will continue to monitor her progress as she works towards meeting these goals.    Expected Outcomes Patient will complete the program meeting both personal and program goals.             ITP Comments:   Comments: ITP REVIEW Pt is making expected progress toward Cardiac Rehab goals after completing 13 sessions. Recommend continued exercise, life style modification, education, and increased stamina and strength.

## 2022-12-22 ENCOUNTER — Encounter (HOSPITAL_COMMUNITY)
Admission: RE | Admit: 2022-12-22 | Discharge: 2022-12-22 | Disposition: A | Discharge status: Home / Self Care | Payer: Medicare Other | Source: Ambulatory Visit | Attending: Internal Medicine | Admitting: Internal Medicine

## 2022-12-22 DIAGNOSIS — I5022 Chronic systolic (congestive) heart failure: Secondary | ICD-10-CM

## 2022-12-22 NOTE — Progress Notes (Signed)
Daily Session Note  Patient Details  Name: Raven Green MRN: [id number] Date of Birth: 09-05-1955 Referring Provider:   Flowsheet Row CARDIAC REHAB PHASE II ORIENTATION from 11/16/2022 in [facility] CARDIAC REHABILITATION  Referring Provider Dr. Jenene Slicker       Encounter Date: 12/22/2022  Check In:  Session Check In - 12/22/22 0930       Check-In   Supervising physician immediately available to respond to emergencies [facility] MD immediately available    Physician(s) Dr. Jenene Slicker    Location [facility]    Staff Present Rodena Medin, RN, Pleas Koch, RN, BSN;Hillary Troutman BSN, RN    Virtual Visit No    Medication changes reported     No    Fall or balance concerns reported    No    Tobacco Cessation No Change    Warm-up and Cool-down Performed as group-led Writer Performed Yes    VAD Patient? No    PAD/SET Patient? No      Pain Assessment   Currently in Pain? No/denies    Pain Score 0-No pain             Capillary Blood Glucose: No results found for this or any previous visit (from the past 24 hour(s)).    Social History   Tobacco Use  Smoking Status Never  Smokeless Tobacco Never    Goals Met:  Independence with exercise equipment Exercise tolerated well No report of concerns or symptoms today Strength training completed today  Goals Unmet:  Not Applicable  Comments: Checkout at 1030.   Dr. Dina Rich is Medical Director for [facility] Cardiac Rehab

## 2022-12-25 ENCOUNTER — Encounter (HOSPITAL_COMMUNITY): Payer: Medicare Other

## 2022-12-27 ENCOUNTER — Encounter (HOSPITAL_COMMUNITY)
Admission: RE | Admit: 2022-12-27 | Discharge: 2022-12-27 | Disposition: A | Discharge status: Home / Self Care | Payer: Medicare Other | Source: Ambulatory Visit | Attending: Internal Medicine | Admitting: Internal Medicine

## 2022-12-27 DIAGNOSIS — I5022 Chronic systolic (congestive) heart failure: Secondary | ICD-10-CM | POA: Diagnosis not present

## 2022-12-27 NOTE — Progress Notes (Signed)
Daily Session Note  Patient Details  Name: Raven Green MRN: [id number] Date of Birth: 10/02/1955 Referring Provider:   Flowsheet Row CARDIAC REHAB PHASE II ORIENTATION from 11/16/2022 in [facility] CARDIAC REHABILITATION  Referring Provider Dr. Jenene Slicker       Encounter Date: 12/27/2022  Check In:  Session Check In - 12/27/22 0949       Check-In   Supervising physician immediately available to respond to emergencies [facility] MD immediately available    Physician(s) Dr. Dina Rich    Location [facility]    Staff Present Ross Ludwig, BS, Exercise Physiologist;Hillary Troutman BSN, RN;Tavari Loadholt Laural Benes, RN, BSN    Virtual Visit No    Medication changes reported     No    Fall or balance concerns reported    No    Tobacco Cessation No Change    Warm-up and Cool-down Performed as group-led instruction    Resistance Training Performed Yes    VAD Patient? No    PAD/SET Patient? No      Pain Assessment   Currently in Pain? No/denies    Pain Score 0-No pain    Multiple Pain Sites No             Capillary Blood Glucose: No results found for this or any previous visit (from the past 24 hour(s)).    Social History   Tobacco Use  Smoking Status Never  Smokeless Tobacco Never    Goals Met:  Independence with exercise equipment Exercise tolerated well No report of concerns or symptoms today Strength training completed today  Goals Unmet:  Not Applicable  Comments: Check out 1030.   Dr. Dina Rich is Medical Director for [facility] Cardiac Rehab

## 2022-12-29 ENCOUNTER — Encounter (HOSPITAL_COMMUNITY)
Admission: RE | Admit: 2022-12-29 | Discharge: 2022-12-29 | Disposition: A | Discharge status: Home / Self Care | Payer: Medicare Other | Source: Ambulatory Visit | Attending: Internal Medicine | Admitting: Internal Medicine

## 2022-12-29 DIAGNOSIS — I5022 Chronic systolic (congestive) heart failure: Secondary | ICD-10-CM

## 2022-12-29 NOTE — Progress Notes (Signed)
Daily Session Note  Patient Details  Name: Raven Green MRN: [id number] Date of Birth: August 16, 1956 Referring Provider:   Flowsheet Row CARDIAC REHAB PHASE II ORIENTATION from 11/16/2022 in [facility] CARDIAC REHABILITATION  Referring Provider Dr. Jenene Slicker       Encounter Date: 12/29/2022  Check In:  Session Check In - 12/29/22 0930       Check-In   Supervising physician immediately available to respond to emergencies [facility] MD immediately available    Physician(s) Dr. Dina Rich    Location [facility]    Staff Present Ross Ludwig, BS, Exercise Physiologist;Jaydynn Wolford Laural Benes, RN, Pleas Koch, RN, BSN    Virtual Visit No    Medication changes reported     No    Fall or balance concerns reported    No    Tobacco Cessation No Change    Warm-up and Cool-down Performed as group-led instruction    Resistance Training Performed Yes    VAD Patient? No    PAD/SET Patient? No      Pain Assessment   Currently in Pain? No/denies    Pain Score 0-No pain    Multiple Pain Sites No             Capillary Blood Glucose: No results found for this or any previous visit (from the past 24 hour(s)).    Social History   Tobacco Use  Smoking Status Never  Smokeless Tobacco Never    Goals Met:  Independence with exercise equipment Exercise tolerated well No report of concerns or symptoms today Strength training completed today  Goals Unmet:  Not Applicable  Comments: Check out 1030.   Dr. Dina Rich is Medical Director for [facility] Cardiac Rehab

## 2023-01-01 ENCOUNTER — Encounter (HOSPITAL_COMMUNITY)
Admission: RE | Admit: 2023-01-01 | Discharge: 2023-01-01 | Disposition: A | Discharge status: Home / Self Care | Payer: Medicare Other | Source: Ambulatory Visit | Attending: Internal Medicine | Admitting: Internal Medicine

## 2023-01-01 VITALS — Wt 225.3 lb

## 2023-01-01 DIAGNOSIS — I5022 Chronic systolic (congestive) heart failure: Secondary | ICD-10-CM | POA: Diagnosis not present

## 2023-01-01 NOTE — Progress Notes (Signed)
Daily Session Note  Patient Details  Name: WILODEAN PUROHIT MRN: [id number] Date of Birth: 07/21/1956 Referring Provider:   Flowsheet Row CARDIAC REHAB PHASE II ORIENTATION from 11/16/2022 in [facility] CARDIAC REHABILITATION  Referring Provider Dr. Jenene Slicker       Encounter Date: 01/01/2023  Check In:  Session Check In - 01/01/23 0930       Check-In   Supervising physician immediately available to respond to emergencies [facility] MD immediately available    Physician(s) Dr. Huston Foley    Location [facility]    Staff Present Rodena Medin, RN, BSN;Hillary Troutman BSN, RN;Heather Fredric Mare, BS, Exercise Physiologist    Virtual Visit No    Medication changes reported     No    Fall or balance concerns reported    No    Tobacco Cessation No Change    Warm-up and Cool-down Performed as group-led instruction    Resistance Training Performed Yes    VAD Patient? No    PAD/SET Patient? No      Pain Assessment   Currently in Pain? No/denies    Pain Score 0-No pain    Multiple Pain Sites No             Capillary Blood Glucose: No results found for this or any previous visit (from the past 24 hour(s)).    Social History   Tobacco Use  Smoking Status Never  Smokeless Tobacco Never    Goals Met:  Independence with exercise equipment Exercise tolerated well No report of concerns or symptoms today Strength training completed today  Goals Unmet:  Not Applicable  Comments: Check out 1030.   Dr. Dina Rich is Medical Director for [facility] Cardiac Rehab

## 2023-01-02 ENCOUNTER — Telehealth: Payer: Self-pay | Admitting: Internal Medicine

## 2023-01-02 NOTE — Telephone Encounter (Signed)
Patient walked into the office with a notification from her [facility] stating that she will be in the donut hole soon with her Farxiga Tab & Entresto Tab. States that the last visit with Dr. Jenene Slicker she was told if she thought she needed assistance please contact the office. Forms are scanned into chart.

## 2023-01-03 ENCOUNTER — Encounter (HOSPITAL_COMMUNITY)
Admission: RE | Admit: 2023-01-03 | Discharge: 2023-01-03 | Disposition: A | Discharge status: Home / Self Care | Payer: Medicare Other | Source: Ambulatory Visit | Attending: Internal Medicine | Admitting: Internal Medicine

## 2023-01-03 DIAGNOSIS — I5022 Chronic systolic (congestive) heart failure: Secondary | ICD-10-CM | POA: Diagnosis not present

## 2023-01-03 NOTE — Progress Notes (Signed)
Daily Session Note  Patient Details  Name: Raven Green MRN: [id number] Date of Birth: 05-25-1956 Referring Provider:   Flowsheet Row CARDIAC REHAB PHASE II ORIENTATION from 11/16/2022 in [facility] CARDIAC REHABILITATION  Referring Provider Dr. Jenene Slicker       Encounter Date: 01/03/2023  Check In:  Session Check In - 01/03/23 0930       Check-In   Supervising physician immediately available to respond to emergencies [facility] MD immediately available    Physician(s) Dr. Diona Browner    Location [facility]    Staff Present Rodena Medin, RN, BSN;Hillary Troutman BSN, RN;Heather Fredric Mare, BS, Exercise Physiologist    Virtual Visit No    Medication changes reported     No    Tobacco Cessation No Change    Warm-up and Cool-down Performed as group-led instruction    Resistance Training Performed Yes    VAD Patient? No    PAD/SET Patient? No      Pain Assessment   Currently in Pain? No/denies    Pain Score 0-No pain    Multiple Pain Sites No             Capillary Blood Glucose: No results found for this or any previous visit (from the past 24 hour(s)).    Social History   Tobacco Use  Smoking Status Never  Smokeless Tobacco Never    Goals Met:  Independence with exercise equipment Exercise tolerated well No report of concerns or symptoms today Strength training completed today  Goals Unmet:  Not Applicable  Comments: Check out 1030.   Dr. Dina Rich is Medical Director for [facility] Cardiac Rehab

## 2023-01-04 ENCOUNTER — Telehealth: Payer: Self-pay | Admitting: Internal Medicine

## 2023-01-04 NOTE — Telephone Encounter (Signed)
Advised that she can have a mammogram

## 2023-01-04 NOTE — Telephone Encounter (Signed)
Patient would like to know if she can have a mammogram with her heart conditions.

## 2023-01-04 NOTE — Telephone Encounter (Addendum)
Called back after speaking wither her husband about their finances and patient assistance. Says she doesn't remember her copay for farxiga or entresto. Says she doesn't qualify for patient assistance due to income being too high. Requesting farxiga and entresto be changed to something less expensive. Will send to provider.

## 2023-01-04 NOTE — Telephone Encounter (Signed)
Raven Green wanted to know if [facility] might be a source she could use to get her prescriptions.

## 2023-01-04 NOTE — Telephone Encounter (Signed)
Advised that [facility] may or may not help but she could try. Says she doesn't remember her copay for farxiga or entresto. Says she doesn't qualify for patient assistance due to income being too high. Requesting farxiga and entresto be changed to something less expensive. Will send to provider.

## 2023-01-04 NOTE — Telephone Encounter (Signed)
Advised that patient assistance applications are available for entresto and farxiga. Also need 2023 income tax information. Verbalized understanding.

## 2023-01-05 ENCOUNTER — Encounter (HOSPITAL_COMMUNITY)
Admission: RE | Admit: 2023-01-05 | Discharge: 2023-01-05 | Disposition: A | Discharge status: Home / Self Care | Payer: Medicare Other | Source: Ambulatory Visit | Attending: Internal Medicine | Admitting: Internal Medicine

## 2023-01-05 DIAGNOSIS — I5022 Chronic systolic (congestive) heart failure: Secondary | ICD-10-CM | POA: Diagnosis not present

## 2023-01-05 MED ORDER — LOSARTAN POTASSIUM 50 MG PO TABS: 50.0000 mg | ORAL_TABLET | Freq: Every day | ORAL | 6 refills | Status: DC

## 2023-01-05 NOTE — Addendum Note (Signed)
Addended by: Eustace Moore on: 01/05/2023 02:50 PM   Modules accepted: Orders

## 2023-01-05 NOTE — Telephone Encounter (Addendum)
Patient informed and verbalized understanding of plan. Will send losartan 50 mg daily to [facility] Says she will try to pay for farxiga and if she comes off of it, she will let us know.

## 2023-01-05 NOTE — Progress Notes (Signed)
Daily Session Note  Patient Details  Name: MINDA BAJAJ MRN: [id number] Date of Birth: December 05, 1955 Referring Provider:   Flowsheet Row CARDIAC REHAB PHASE II ORIENTATION from 11/16/2022 in [facility] CARDIAC REHABILITATION  Referring Provider Dr. Jenene Slicker       Encounter Date: 01/05/2023  Check In:  Session Check In - 01/05/23 0927       Check-In   Supervising physician immediately available to respond to emergencies [facility] MD immediately available    Physician(s) Dr. Diona Browner    Location [facility]    Staff Present Ross Ludwig, BS, Exercise Physiologist;Nakiya Rallis Laural Benes, RN, Pleas Koch, RN, BSN    Virtual Visit No    Medication changes reported     No    Fall or balance concerns reported    No    Tobacco Cessation No Change    Warm-up and Cool-down Performed as group-led instruction    Resistance Training Performed Yes    VAD Patient? No    PAD/SET Patient? No      Pain Assessment   Currently in Pain? No/denies    Pain Score 0-No pain    Multiple Pain Sites No             Capillary Blood Glucose: No results found for this or any previous visit (from the past 24 hour(s)).    Social History   Tobacco Use  Smoking Status Never  Smokeless Tobacco Never    Goals Met:  Independence with exercise equipment Exercise tolerated well No report of concerns or symptoms today Strength training completed today  Goals Unmet:  Not Applicable  Comments: Check out 1030.   Dr. Dina Rich is Medical Director for [facility] Cardiac Rehab

## 2023-01-08 ENCOUNTER — Encounter (HOSPITAL_COMMUNITY)
Admission: RE | Admit: 2023-01-08 | Discharge: 2023-01-08 | Disposition: A | Discharge status: Home / Self Care | Payer: Medicare Other | Source: Ambulatory Visit | Attending: Internal Medicine | Admitting: Internal Medicine

## 2023-01-08 DIAGNOSIS — I5022 Chronic systolic (congestive) heart failure: Secondary | ICD-10-CM | POA: Diagnosis not present

## 2023-01-08 NOTE — Progress Notes (Signed)
Daily Session Note  Patient Details  Name: Raven Green MRN: [id number] Date of Birth: 1956/04/23 Referring Provider:   Flowsheet Row CARDIAC REHAB PHASE II ORIENTATION from 11/16/2022 in [facility] CARDIAC REHABILITATION  Referring Provider Dr. Jenene Slicker       Encounter Date: 01/08/2023  Check In:  Session Check In - 01/08/23 0930       Check-In   Supervising physician immediately available to respond to emergencies [facility] MD immediately available    Physician(s) Dr. Jenene Slicker    Location [facility]    Staff Present Ross Ludwig, BS, Exercise Physiologist;Dalton Debbe Mounts, MS, ACSM-CEP    Virtual Visit No    Medication changes reported     No    Fall or balance concerns reported    No    Tobacco Cessation No Change    Warm-up and Cool-down Performed as group-led instruction    Resistance Training Performed Yes    VAD Patient? No    PAD/SET Patient? No      Pain Assessment   Currently in Pain? No/denies    Pain Score 4     Pain Location Hip    Pain Orientation Right    Pain Descriptors / Indicators Aching    Pain Type Chronic pain    Pain Onset More than a month ago    Pain Frequency Intermittent    Multiple Pain Sites No             Capillary Blood Glucose: No results found for this or any previous visit (from the past 24 hour(s)).    Social History   Tobacco Use  Smoking Status Never  Smokeless Tobacco Never    Goals Met:  Independence with exercise equipment Exercise tolerated well No report of concerns or symptoms today Strength training completed today  Goals Unmet:  Not Applicable  Comments: Check out 1030   Dr. Dina Rich is Medical Director for [facility] Cardiac Rehab

## 2023-01-10 ENCOUNTER — Encounter (HOSPITAL_COMMUNITY)
Admission: RE | Admit: 2023-01-10 | Discharge: 2023-01-10 | Disposition: A | Discharge status: Home / Self Care | Payer: Medicare Other | Source: Ambulatory Visit | Attending: Internal Medicine | Admitting: Internal Medicine

## 2023-01-10 DIAGNOSIS — I5022 Chronic systolic (congestive) heart failure: Secondary | ICD-10-CM

## 2023-01-10 NOTE — Progress Notes (Signed)
Daily Session Note  Patient Details  Name: Raven Green MRN: [id number] Date of Birth: March 03, 1956 Referring Provider:   Flowsheet Row CARDIAC REHAB PHASE II ORIENTATION from 11/16/2022 in [facility] CARDIAC REHABILITATION  Referring Provider Dr. Jenene Slicker       Encounter Date: 01/10/2023  Check In:  Session Check In - 01/10/23 0930       Check-In   Supervising physician immediately available to respond to emergencies [facility] MD immediately available    Physician(s) Dr. Jenene Slicker    Location [facility]    Staff Present Ross Ludwig, BS, Exercise Physiologist;Cali Cuartas Debbe Mounts, MS, ACSM-CEP    Virtual Visit No    Medication changes reported     No    Fall or balance concerns reported    No    Tobacco Cessation No Change    Warm-up and Cool-down Performed as group-led instruction    Resistance Training Performed Yes    VAD Patient? No    PAD/SET Patient? No      Pain Assessment   Currently in Pain? No/denies    Pain Score 0-No pain    Multiple Pain Sites No             Capillary Blood Glucose: No results found for this or any previous visit (from the past 24 hour(s)).    Social History   Tobacco Use  Smoking Status Never  Smokeless Tobacco Never    Goals Met:  Independence with exercise equipment Exercise tolerated well No report of concerns or symptoms today Strength training completed today  Goals Unmet:  Not Applicable  Comments: checkout time is 1030   Dr. Dina Rich is Medical Director for [facility] Cardiac Rehab

## 2023-01-12 ENCOUNTER — Encounter (HOSPITAL_COMMUNITY)
Admission: RE | Admit: 2023-01-12 | Discharge: 2023-01-12 | Disposition: A | Discharge status: Home / Self Care | Payer: Medicare Other | Source: Ambulatory Visit | Attending: Internal Medicine | Admitting: Internal Medicine

## 2023-01-12 DIAGNOSIS — I5022 Chronic systolic (congestive) heart failure: Secondary | ICD-10-CM | POA: Diagnosis not present

## 2023-01-12 NOTE — Progress Notes (Signed)
Daily Session Note  Patient Details  Name: Raven Green MRN: [id number] Date of Birth: 05/22/1956 Referring Provider:   Flowsheet Row CARDIAC REHAB PHASE II ORIENTATION from 11/16/2022 in [facility] CARDIAC REHABILITATION  Referring Provider Dr. Jenene Slicker       Encounter Date: 01/12/2023  Check In:  Session Check In - 01/12/23 0930       Check-In   Supervising physician immediately available to respond to emergencies [facility] MD immediately available    Physician(s) Dr. Jenene Slicker    Location [facility]    Staff Present Cristopher Peru MHA, MS, ACSM-CEP;Nohealani Medinger Laural Benes, RN, Pleas Koch, RN, BSN    Virtual Visit No    Medication changes reported     No    Fall or balance concerns reported    No    Tobacco Cessation No Change    Warm-up and Cool-down Performed as group-led instruction    Resistance Training Performed Yes    VAD Patient? No    PAD/SET Patient? No      Pain Assessment   Currently in Pain? No/denies    Pain Score 0-No pain    Multiple Pain Sites No             Capillary Blood Glucose: No results found for this or any previous visit (from the past 24 hour(s)).    Social History   Tobacco Use  Smoking Status Never  Smokeless Tobacco Never    Goals Met:  Independence with exercise equipment Exercise tolerated well No report of concerns or symptoms today Strength training completed today  Goals Unmet:  Not Applicable  Comments: Check out 1030.   Dr. Dina Rich is Medical Director for [facility] Cardiac Rehab

## 2023-01-15 ENCOUNTER — Encounter (HOSPITAL_COMMUNITY)
Admission: RE | Admit: 2023-01-15 | Discharge: 2023-01-15 | Disposition: A | Discharge status: Home / Self Care | Payer: Medicare Other | Source: Ambulatory Visit | Attending: Internal Medicine | Admitting: Internal Medicine

## 2023-01-15 VITALS — Wt 222.9 lb

## 2023-01-15 DIAGNOSIS — I5022 Chronic systolic (congestive) heart failure: Secondary | ICD-10-CM | POA: Diagnosis not present

## 2023-01-15 NOTE — Progress Notes (Signed)
Daily Session Note  Patient Details  Name: Raven Green MRN: [id number] Date of Birth: 02/16/1956 Referring Provider:   Flowsheet Row CARDIAC REHAB PHASE II ORIENTATION from 11/16/2022 in [facility] CARDIAC REHABILITATION  Referring Provider Dr. Jenene Slicker       Encounter Date: 01/15/2023  Check In:  Session Check In - 01/15/23 0930       Check-In   Supervising physician immediately available to respond to emergencies [facility] MD immediately available    Physician(s) Dr. Wyline Mood    Location [facility]    Staff Present Cristopher Peru MHA, MS, ACSM-CEP;Staci Righter, RN, BSN;Heather Fredric Mare, BS, Exercise Physiologist    Virtual Visit No    Medication changes reported     No    Fall or balance concerns reported    No    Tobacco Cessation No Change    Warm-up and Cool-down Performed as group-led instruction    Resistance Training Performed Yes    VAD Patient? No    PAD/SET Patient? No      Pain Assessment   Currently in Pain? No/denies    Pain Score 0-No pain             Capillary Blood Glucose: No results found for this or any previous visit (from the past 24 hour(s)).    Social History   Tobacco Use  Smoking Status Never  Smokeless Tobacco Never    Goals Met:  Independence with exercise equipment Exercise tolerated well No report of concerns or symptoms today Strength training completed today  Goals Unmet:  Not Applicable  Comments: Checkout at 1030.   Dr. Dina Rich is Medical Director for [facility] Cardiac Rehab

## 2023-01-17 ENCOUNTER — Encounter (HOSPITAL_COMMUNITY)
Admission: RE | Admit: 2023-01-17 | Discharge: 2023-01-17 | Disposition: A | Discharge status: Home / Self Care | Payer: Medicare Other | Source: Ambulatory Visit | Attending: Internal Medicine | Admitting: Internal Medicine

## 2023-01-17 DIAGNOSIS — I5022 Chronic systolic (congestive) heart failure: Secondary | ICD-10-CM

## 2023-01-17 NOTE — Progress Notes (Signed)
Cardiac Individual Treatment Plan  Patient Details  Name: Raven Green MRN: [id number] Date of Birth: [birth year] Referring Provider:   Flowsheet Row CARDIAC REHAB PHASE II ORIENTATION from 11/16/2022 in [facility] CARDIAC REHABILITATION  Referring Provider Dr. Jenene Slicker       Initial Encounter Date:  Flowsheet Row CARDIAC REHAB PHASE II ORIENTATION from 11/16/2022 in [place] CARDIAC REHABILITATION  Date 11/16/22       Visit Diagnosis: Heart failure, chronic systolic (HCC)  Patient's Home Medications on Admission:  Current Outpatient Medications:    dapagliflozin propanediol (FARXIGA) 10 MG TABS tablet, Take 1 tablet (10 mg total) by mouth daily before breakfast., Disp: 30 tablet, Rfl: 6   furosemide (LASIX) 40 MG tablet, Take 1 tablet (40 mg total) by mouth daily., Disp: 30 tablet, Rfl: 11   linaGLIPtin-metFORMIN HCl ER (JENTADUETO XR) 12-998 MG TB24, Take 1 tablet by mouth daily at 6 PM., Disp: , Rfl:    losartan (COZAAR) 50 MG tablet, Take 1 tablet (50 mg total) by mouth daily., Disp: 30 tablet, Rfl: 6   metoprolol tartrate (LOPRESSOR) 25 MG tablet, Take 1 tablet (25 mg total) by mouth 2 (two) times daily., Disp: 180 tablet, Rfl: 3   nitroGLYCERIN (NITROSTAT) 0.4 MG SL tablet, Place 1 tablet (0.4 mg total) under the tongue every 5 (five) minutes as needed for chest pain., Disp: 25 tablet, Rfl: 3   potassium chloride SA (KLOR-CON M) 20 MEQ tablet, Take 2 tablets (40 mEq total) by mouth 2 (two) times daily for 3 days, THEN 1 tablet (20 mEq total) 2 (two) times daily. (Patient taking differently: Take 1 tablet (20 mEq total) 2 (two) times daily.), Disp: 180 tablet, Rfl: 3   spironolactone (ALDACTONE) 25 MG tablet, Take 1 tablet (25 mg total) by mouth daily., Disp: 90 tablet, Rfl: 1   TOUJEO MAX SOLOSTAR 300 UNIT/ML Solostar Pen, Inject 44 Units into the skin daily at 10 pm., Disp: , Rfl:   Past Medical History: Past Medical History:  Diagnosis Date   Arthritis    Diabetes  mellitus without complication (HCC)    gestational   Endometriosis    History of migraine headaches    Hypertension    IBS (irritable bowel syndrome)     Tobacco Use: Social History   Tobacco Use  Smoking Status Never  Smokeless Tobacco Never    Labs: Review Flowsheet       Latest Ref Rng & Units 08/13/2013 10/12/2022  Labs for ITP Cardiac and Pulmonary Rehab  PH, Arterial 7.35 - 7.45 - 7.394   PCO2 arterial 32 - 48 mmHg - 45.4   Bicarbonate 20.0 - 28.0 mmol/L - 27.7  28.4  27.8   TCO2 22 - 32 mmol/L 28  29  30  29    O2 Saturation % - 95  72  72     Capillary Blood Glucose: Lab Results  Component Value Date   GLUCAP 155 (H) 11/16/2022   GLUCAP 147 (H) 04/20/2021   GLUCAP 136 (H) 08/13/2013    POCT Glucose     Row Name 11/20/22 1039             POCT Blood Glucose   Pre-Exercise 213 mg/dL                Exercise Target Goals: Exercise Program Goal: Individual exercise prescription set using results from initial 6 min walk test and THRR while considering  patient's activity barriers and safety.   Exercise Prescription Goal: Starting with aerobic  activity 30 plus minutes a day, 3 days per week for initial exercise prescription. Provide home exercise prescription and guidelines that participant acknowledges understanding prior to discharge.  Activity Barriers & Risk Stratification:  Activity Barriers & Cardiac Risk Stratification - 11/16/22 1323       Activity Barriers & Cardiac Risk Stratification   Activity Barriers Arthritis;Back Problems;Neck/Spine Problems;Joint Problems;Deconditioning;Decreased Ventricular Function    Cardiac Risk Stratification High             6 Minute Walk:  6 Minute Walk     Row Name 11/16/22 1316 12/04/22 1241       6 Minute Walk   Phase Initial Initial    Distance 1100 feet --    Walk Time 6 minutes --    # of Rest Breaks 0 --    MPH 2.08 --    METS 2.54 --    RPE 12 --    VO2 Peak 8.89 --    Symptoms No  --    Resting HR 75 bpm --    Resting BP 112/60 --    Resting Oxygen Saturation  95 % --    Exercise Oxygen Saturation  during 6 min walk 96 % --    Max Ex. HR 98 bpm --    Max Ex. BP 140/60 --    2 Minute Post BP 120/60 --             Oxygen Initial Assessment:   Oxygen Re-Evaluation:   Oxygen Discharge (Final Oxygen Re-Evaluation):   Initial Exercise Prescription:  Initial Exercise Prescription - 11/16/22 1300       Date of Initial Exercise RX and Referring Provider   Date 11/16/22    Referring Provider Dr. Jenene Slicker    Expected Discharge Date 02/16/23      NuStep   Level 1    SPM 80    Minutes 39      Prescription Details   Frequency (times per week) 3    Duration Progress to 30 minutes of continuous aerobic without signs/symptoms of physical distress      Intensity   THRR 40-80% of Max Heartrate 63-123    Ratings of Perceived Exertion 11-13      Resistance Training   Training Prescription Yes    Weight 0    Reps 10-15             Perform Capillary Blood Glucose checks as needed.  Exercise Prescription Changes:   Exercise Prescription Changes     Row Name 11/20/22 1000 12/04/22 1200 12/18/22 1300 01/01/23 1400 01/15/23 1200     Response to Exercise   Blood Pressure (Admit) 112/60 126/70 124/60 126/64 114/70   Blood Pressure (Exercise) 132/74 142/66 126/58 130/60 118/72   Blood Pressure (Exit) 112/62 124/60 114/60 122/62 112/66   Heart Rate (Admit) 85 bpm 73 bpm 79 bpm 81 bpm 75 bpm   Heart Rate (Exercise) 98 bpm 101 bpm 98 bpm 95 bpm 91 bpm   Heart Rate (Exit) 95 bpm 82 bpm 88 bpm 87 bpm 83 bpm   Rating of Perceived Exertion (Exercise) 12 12 12 12 12    Duration Continue with 30 min of aerobic exercise without signs/symptoms of physical distress. Continue with 30 min of aerobic exercise without signs/symptoms of physical distress. Continue with 30 min of aerobic exercise without signs/symptoms of physical distress. Continue with 30 min of  aerobic exercise without signs/symptoms of physical distress. Continue with 30 min of aerobic exercise without signs/symptoms of  physical distress.   Intensity THRR unchanged THRR unchanged THRR unchanged THRR unchanged THRR unchanged     Progression   Progression -- Continue to progress workloads to maintain intensity without signs/symptoms of physical distress. Continue to progress workloads to maintain intensity without signs/symptoms of physical distress. Continue to progress workloads to maintain intensity without signs/symptoms of physical distress. Continue to progress workloads to maintain intensity without signs/symptoms of physical distress.     Resistance Training   Training Prescription Yes Yes Yes Yes Yes   Weight 3 2 2 2 2    Reps 10-15 10-15 10-15 10-15 10-15   Time 10 Minutes 10 Minutes 10 Minutes 10 Minutes 10 Minutes     NuStep   Level 1 1 1 2 2    SPM 80 94 100 92 98   Minutes 39 39 39 39 39   METs 1.96 2.23 2.23 2.19 2.25            Exercise Comments:   Exercise Goals and Review:   Exercise Goals     Row Name 11/16/22 1319 11/20/22 1041 12/18/22 1335 01/16/23 0758       Exercise Goals   Increase Physical Activity Yes Yes Yes Yes    Intervention Provide advice, education, support and counseling about physical activity/exercise needs.;Develop an individualized exercise prescription for aerobic and resistive training based on initial evaluation findings, risk stratification, comorbidities and participant's personal goals. Provide advice, education, support and counseling about physical activity/exercise needs.;Develop an individualized exercise prescription for aerobic and resistive training based on initial evaluation findings, risk stratification, comorbidities and participant's personal goals. Provide advice, education, support and counseling about physical activity/exercise needs.;Develop an individualized exercise prescription for aerobic and resistive training  based on initial evaluation findings, risk stratification, comorbidities and participant's personal goals. Provide advice, education, support and counseling about physical activity/exercise needs.;Develop an individualized exercise prescription for aerobic and resistive training based on initial evaluation findings, risk stratification, comorbidities and participant's personal goals.    Expected Outcomes Short Term: Attend rehab on a regular basis to increase amount of physical activity.;Long Term: Add in home exercise to make exercise part of routine and to increase amount of physical activity.;Long Term: Exercising regularly at least 3-5 days a week. Short Term: Attend rehab on a regular basis to increase amount of physical activity.;Long Term: Add in home exercise to make exercise part of routine and to increase amount of physical activity.;Long Term: Exercising regularly at least 3-5 days a week. Short Term: Attend rehab on a regular basis to increase amount of physical activity.;Long Term: Add in home exercise to make exercise part of routine and to increase amount of physical activity.;Long Term: Exercising regularly at least 3-5 days a week. Short Term: Attend rehab on a regular basis to increase amount of physical activity.;Long Term: Add in home exercise to make exercise part of routine and to increase amount of physical activity.;Long Term: Exercising regularly at least 3-5 days a week.    Increase Strength and Stamina Yes Yes Yes Yes    Intervention Provide advice, education, support and counseling about physical activity/exercise needs.;Develop an individualized exercise prescription for aerobic and resistive training based on initial evaluation findings, risk stratification, comorbidities and participant's personal goals. Provide advice, education, support and counseling about physical activity/exercise needs.;Develop an individualized exercise prescription for aerobic and resistive training based on  initial evaluation findings, risk stratification, comorbidities and participant's personal goals. Provide advice, education, support and counseling about physical activity/exercise needs.;Develop an individualized exercise prescription for aerobic and  resistive training based on initial evaluation findings, risk stratification, comorbidities and participant's personal goals. Provide advice, education, support and counseling about physical activity/exercise needs.;Develop an individualized exercise prescription for aerobic and resistive training based on initial evaluation findings, risk stratification, comorbidities and participant's personal goals.    Expected Outcomes Short Term: Increase workloads from initial exercise prescription for resistance, speed, and METs.;Short Term: Perform resistance training exercises routinely during rehab and add in resistance training at home;Long Term: Improve cardiorespiratory fitness, muscular endurance and strength as measured by increased METs and functional capacity ( ) Short Term: Increase workloads from initial exercise prescription for resistance, speed, and METs.;Short Term: Perform resistance training exercises routinely during rehab and add in resistance training at home;Long Term: Improve cardiorespiratory fitness, muscular endurance and strength as measured by increased METs and functional capacity ( ) Short Term: Increase workloads from initial exercise prescription for resistance, speed, and METs.;Short Term: Perform resistance training exercises routinely during rehab and add in resistance training at home;Long Term: Improve cardiorespiratory fitness, muscular endurance and strength as measured by increased METs and functional capacity ( ) Short Term: Increase workloads from initial exercise prescription for resistance, speed, and METs.;Short Term: Perform resistance training exercises routinely during rehab and add in resistance training at home;Long  Term: Improve cardiorespiratory fitness, muscular endurance and strength as measured by increased METs and functional capacity ( )    Able to understand and use rate of perceived exertion (RPE) scale Yes Yes Yes Yes    Intervention Provide education and explanation on how to use RPE scale Provide education and explanation on how to use RPE scale Provide education and explanation on how to use RPE scale Provide education and explanation on how to use RPE scale    Expected Outcomes Short Term: Able to use RPE daily in rehab to express subjective intensity level;Long Term:  Able to use RPE to guide intensity level when exercising independently Short Term: Able to use RPE daily in rehab to express subjective intensity level;Long Term:  Able to use RPE to guide intensity level when exercising independently Short Term: Able to use RPE daily in rehab to express subjective intensity level;Long Term:  Able to use RPE to guide intensity level when exercising independently Short Term: Able to use RPE daily in rehab to express subjective intensity level;Long Term:  Able to use RPE to guide intensity level when exercising independently    Knowledge and understanding of Target Heart Rate Range (THRR) Yes Yes Yes Yes    Intervention Provide education and explanation of THRR including how the numbers were predicted and where they are located for reference Provide education and explanation of THRR including how the numbers were predicted and where they are located for reference Provide education and explanation of THRR including how the numbers were predicted and where they are located for reference Provide education and explanation of THRR including how the numbers were predicted and where they are located for reference    Expected Outcomes Short Term: Able to state/look up THRR;Short Term: Able to use daily as guideline for intensity in rehab;Long Term: Able to use THRR to govern intensity when exercising independently  Short Term: Able to state/look up THRR;Short Term: Able to use daily as guideline for intensity in rehab;Long Term: Able to use THRR to govern intensity when exercising independently Short Term: Able to state/look up THRR;Short Term: Able to use daily as guideline for intensity in rehab;Long Term: Able to use THRR to govern intensity when exercising independently Short Term: Able to state/look  up THRR;Short Term: Able to use daily as guideline for intensity in rehab;Long Term: Able to use THRR to govern intensity when exercising independently    Able to check pulse independently Yes Yes Yes Yes    Intervention Provide education and demonstration on how to check pulse in carotid and radial arteries.;Review the importance of being able to check your own pulse for safety during independent exercise Provide education and demonstration on how to check pulse in carotid and radial arteries.;Review the importance of being able to check your own pulse for safety during independent exercise Provide education and demonstration on how to check pulse in carotid and radial arteries.;Review the importance of being able to check your own pulse for safety during independent exercise Provide education and demonstration on how to check pulse in carotid and radial arteries.;Review the importance of being able to check your own pulse for safety during independent exercise    Expected Outcomes Short Term: Able to explain why pulse checking is important during independent exercise;Long Term: Able to check pulse independently and accurately Short Term: Able to explain why pulse checking is important during independent exercise;Long Term: Able to check pulse independently and accurately Short Term: Able to explain why pulse checking is important during independent exercise;Long Term: Able to check pulse independently and accurately Short Term: Able to explain why pulse checking is important during independent exercise;Long Term: Able to  check pulse independently and accurately    Understanding of Exercise Prescription Yes Yes Yes Yes    Intervention Provide education, explanation, and written materials on patient's individual exercise prescription Provide education, explanation, and written materials on patient's individual exercise prescription Provide education, explanation, and written materials on patient's individual exercise prescription Provide education, explanation, and written materials on patient's individual exercise prescription    Expected Outcomes Short Term: Able to explain program exercise prescription;Long Term: Able to explain home exercise prescription to exercise independently Short Term: Able to explain program exercise prescription;Long Term: Able to explain home exercise prescription to exercise independently Short Term: Able to explain program exercise prescription;Long Term: Able to explain home exercise prescription to exercise independently Short Term: Able to explain program exercise prescription;Long Term: Able to explain home exercise prescription to exercise independently             Exercise Goals Re-Evaluation :  Exercise Goals Re-Evaluation     Row Name 11/20/22 1043 12/18/22 1335 01/16/23 0758         Exercise Goal Re-Evaluation   Exercise Goals Review Increase Physical Activity;Increase Strength and Stamina;Able to understand and use rate of perceived exertion (RPE) scale;Knowledge and understanding of Target Heart Rate Range (THRR);Able to check pulse independently;Understanding of Exercise Prescription Increase Physical Activity;Increase Strength and Stamina;Able to understand and use rate of perceived exertion (RPE) scale;Knowledge and understanding of Target Heart Rate Range (THRR);Able to check pulse independently;Understanding of Exercise Prescription Increase Strength and Stamina;Increase Physical Activity;Able to understand and use rate of perceived exertion (RPE) scale;Knowledge and  understanding of Target Heart Rate Range (THRR);Able to check pulse independently;Understanding of Exercise Prescription     Comments Pt has completed 1 exercise session of CR. She is limited by what she described as muscle pain/stiffness that she attributes to Lasix. She has been very sedentary over the past couple of months. She exercised at 1.96 METs on the stepper during her first session. Will continue to monitor and progress as able. Pt has completed 12 sessions of cardiac rehab. She still continues to be limited due to muscle  pain/stiffness that she attribytes to Lasix. She is progressing slow due to being deconditioned. She has increased her SPM on the stepper and currently exercising at  2.23 METs on the stepper. WIll continue to monitor and progress as able. Pt has completed 23 sessions of cardiac rehab. She continues to be limited due to ongoing mucles pain/stiffness. She continues to progress slowly and has not wanted to increase her level on the stepper after being asked. She is currently exercising at 2.25 METs on the stepper. Will continue to monitor and progress as able.     Expected Outcomes Through exercise at rehab and at home, the patient will meet their stated goals. Through exercise at rehab and at home, the patient will meet their stated goals. Through exercise at rehab and at home, the patient will meet their stated goals.               Discharge Exercise Prescription (Final Exercise Prescription Changes):  Exercise Prescription Changes - 01/15/23 1200       Response to Exercise   Blood Pressure (Admit) 114/70    Blood Pressure (Exercise) 118/72    Blood Pressure (Exit) 112/66    Heart Rate (Admit) 75 bpm    Heart Rate (Exercise) 91 bpm    Heart Rate (Exit) 83 bpm    Rating of Perceived Exertion (Exercise) 12    Duration Continue with 30 min of aerobic exercise without signs/symptoms of physical distress.    Intensity THRR unchanged      Progression   Progression  Continue to progress workloads to maintain intensity without signs/symptoms of physical distress.      Resistance Training   Training Prescription Yes    Weight 2    Reps 10-15    Time 10 Minutes      NuStep   Level 2    SPM 98    Minutes 39    METs 2.25             Nutrition:  Target Goals: Understanding of nutrition guidelines, daily intake of sodium 1500mg , cholesterol 200mg , calories 30% from fat and 7% or less from saturated fats, daily to have 5 or more servings of fruits and vegetables.  Biometrics:  Pre Biometrics - 11/16/22 1406       Pre Biometrics   Height 5\' 4"  (1.626 m)    Weight 100 kg    BMI (Calculated) 37.82              Nutrition Therapy Plan and Nutrition Goals:  Nutrition Therapy & Goals - 11/16/22 1425       Personal Nutrition Goals   Comments Patient scored 56 on diet assessment. We provide educational sessions on heart healthy nutrition with handouts and assistance with RD referral if patient is interested.      Intervention Plan   Intervention Nutrition handout(s) given to patient.    Expected Outcomes Short Term Goal: Understand basic principles of dietary content, such as calories, fat, sodium, cholesterol and nutrients.             Nutrition Assessments:  Nutrition Assessments - 11/16/22 1330       MEDFICTS Scores   Pre Score 56            MEDIFICTS Score Key: ?70 Need to make dietary changes  40-70 Heart Healthy Diet ? 40 Therapeutic Level Cholesterol Diet   Picture Your Plate Scores: <16 Unhealthy dietary pattern with much room for improvement. 41-50 Dietary pattern unlikely to meet  recommendations for good health and room for improvement. 51-60 More healthful dietary pattern, with some room for improvement.  >60 Healthy dietary pattern, although there may be some specific behaviors that could be improved.    Nutrition Goals Re-Evaluation:   Nutrition Goals Discharge (Final Nutrition Goals  Re-Evaluation):   Psychosocial: Target Goals: Acknowledge presence or absence of significant depression and/or stress, maximize coping skills, provide positive support system. Participant is able to verbalize types and ability to use techniques and skills needed for reducing stress and depression.  Initial Review & Psychosocial Screening:  Initial Psych Review & Screening - 11/16/22 1328       Initial Review   Current issues with None Identified      Family Dynamics   Good Support System? Yes    Comments pt's husband      Barriers   Psychosocial barriers to participate in program There are no identifiable barriers or psychosocial needs.      Screening Interventions   Interventions Encouraged to exercise;Provide feedback about the scores to participant;Other (comment)    Expected Outcomes Short Term goal: Identification and review with participant of any Quality of Life or Depression concerns found by scoring the questionnaire.;Long Term goal: The participant improves quality of Life and PHQ9 Scores as seen by post scores and/or verbalization of changes             Quality of Life Scores:  Quality of Life - 11/16/22 1333       Quality of Life   Select Quality of Life      Quality of Life Scores   Health/Function Pre 14.5 %    Socioeconomic Pre 30 %    Psych/Spiritual Pre 19.71 %    Family Pre 21.6 %    GLOBAL Pre 19.5 %            Scores of 19 and below usually indicate a poorer quality of life in these areas.  A difference of  2-3 points is a clinically meaningful difference.  A difference of 2-3 points in the total score of the Quality of Life Index has been associated with significant improvement in overall quality of life, self-image, physical symptoms, and general health in studies assessing change in quality of life.  PHQ-9: Review Flowsheet       11/16/2022  Depression screen PHQ 2/9  Decreased Interest 1  Down, Depressed, Hopeless 0  PHQ - 2 Score 1   Altered sleeping 1  Tired, decreased energy 3  Change in appetite 2  Feeling bad or failure about yourself  0  Trouble concentrating 0  Moving slowly or fidgety/restless 0  Suicidal thoughts 0  PHQ-9 Score 7  Difficult doing work/chores Somewhat difficult   Interpretation of Total Score  Total Score Depression Severity:  1-4 = Minimal depression, 5-9 = Mild depression, 10-14 = Moderate depression, 15-19 = Moderately severe depression, 20-27 = Severe depression   Psychosocial Evaluation and Intervention:  Psychosocial Evaluation - 11/16/22 1447       Psychosocial Evaluation & Interventions   Interventions Stress management education;Relaxation education;Encouraged to exercise with the program and follow exercise prescription    Comments Patient has no identifiable psychosocial issues.  She has a good support system with her husband.  She has no other family in this area since she and her husband left [place] for her husband's job 40 years ago.  She scored a 7 on her PHQ-9, which relates to her fatigue, overeating, and trouble sleeping when she takes  Lasix.  She is currently taking Lasix every few days, since the medication causes muscle aches in her legs and buttocks.  Her goals are to lose weight, increase her strength and endurance, and get back to "feeling normal."  We will continue to monitor her progress toward meeting these goals.    Expected Outcomes The pt will continue to have no identifiable psychosocial issues.    Continue Psychosocial Services  No Follow up required             Psychosocial Re-Evaluation:  Psychosocial Re-Evaluation     Row Name 11/20/22 1254 12/11/22 1418 01/08/23 1228         Psychosocial Re-Evaluation   Current issues with None Identified None Identified None Identified     Comments Patient is new to the program. She has completed 1 session. She continues to have no psychosocial barriers identified. We will continue to monitor her progress in  the program. Patient has completed 8 session. She continues to have no psychosocial barriers identified. She enjoys the sessions and demonstrates an interest in improving her health. We will continue to monitor her progress in the program. Patient has completed 20 session. She continues to have no psychosocial barriers identified. She enjoys the sessions and demonstrates an interest in improving her health. We will continue to monitor her progress in the program.     Expected Outcomes Patient will continue to have no psychosocial barriers identified. Patient will continue to have no psychosocial barriers identified. Patient will continue to have no psychosocial barriers identified.     Interventions Relaxation education;Stress management education;Encouraged to attend Cardiac Rehabilitation for the exercise Relaxation education;Stress management education;Encouraged to attend Cardiac Rehabilitation for the exercise Relaxation education;Stress management education;Encouraged to attend Cardiac Rehabilitation for the exercise     Continue Psychosocial Services  No Follow up required No Follow up required No Follow up required              Psychosocial Discharge (Final Psychosocial Re-Evaluation):  Psychosocial Re-Evaluation - 01/08/23 1228       Psychosocial Re-Evaluation   Current issues with None Identified    Comments Patient has completed 20 session. She continues to have no psychosocial barriers identified. She enjoys the sessions and demonstrates an interest in improving her health. We will continue to monitor her progress in the program.    Expected Outcomes Patient will continue to have no psychosocial barriers identified.    Interventions Relaxation education;Stress management education;Encouraged to attend Cardiac Rehabilitation for the exercise    Continue Psychosocial Services  No Follow up required             Vocational Rehabilitation: Provide vocational rehab assistance to  qualifying candidates.   Vocational Rehab Evaluation & Intervention:  Vocational Rehab - 11/16/22 1251       Initial Vocational Rehab Evaluation & Intervention   Assessment shows need for Vocational Rehabilitation No      Vocational Rehab Re-Evaulation   Comments retired             Education: Education Goals: Education classes will be provided on a weekly basis, covering required topics. Participant will state understanding/return demonstration of topics presented.  Learning Barriers/Preferences:  Learning Barriers/Preferences - 11/16/22 1332       Learning Barriers/Preferences   Learning Barriers None    Learning Preferences Group Instruction             Education Topics: Hypertension, Hypertension Reduction -Define heart disease and high blood pressure. Discus how high  blood pressure affects the body and ways to reduce high blood pressure. Flowsheet Row CARDIAC REHAB PHASE II EXERCISE from 01/10/2023 in [place] CARDIAC REHABILITATION  Date 12/06/22  Educator DM  Instruction Review Code 2- Demonstrated Understanding       Exercise and Your Heart -Discuss why it is important to exercise, the FITT principles of exercise, normal and abnormal responses to exercise, and how to exercise safely. Flowsheet Row CARDIAC REHAB PHASE II EXERCISE from 01/10/2023 in [place] CARDIAC REHABILITATION  Date 12/13/22  Educator HB  Instruction Review Code 1- Verbalizes Understanding       Angina -Discuss definition of angina, causes of angina, treatment of angina, and how to decrease risk of having angina. Flowsheet Row CARDIAC REHAB PHASE II EXERCISE from 01/10/2023 in [place] CARDIAC REHABILITATION  Date 12/20/22  Educator HB  Instruction Review Code 1- Verbalizes Understanding       Cardiac Medications -Review what the following cardiac medications are used for, how they affect the body, and side effects that may occur when taking the medications.  Medications  include Aspirin, Beta blockers, calcium channel blockers, ACE Inhibitors, angiotensin receptor blockers, diuretics, digoxin, and antihyperlipidemics.   Congestive Heart Failure -Discuss the definition of CHF, how to live with CHF, the signs and symptoms of CHF, and how keep track of weight and sodium intake. Flowsheet Row CARDIAC REHAB PHASE II EXERCISE from 01/10/2023 in [place] CARDIAC REHABILITATION  Date 12/27/22  Educator HB  Instruction Review Code 1- Verbalizes Understanding       Heart Disease and Intimacy -Discus the effect sexual activity has on the heart, how changes occur during intimacy as we age, and safety during sexual activity. Flowsheet Row CARDIAC REHAB PHASE II EXERCISE from 01/10/2023 in [place] CARDIAC REHABILITATION  Date 01/03/23  Educator HB  Instruction Review Code 1- Verbalizes Understanding       Smoking Cessation / COPD -Discuss different methods to quit smoking, the health benefits of quitting smoking, and the definition of COPD. Flowsheet Row CARDIAC REHAB PHASE II EXERCISE from 01/10/2023 in [place] CARDIAC REHABILITATION  Date 01/10/23  Educator HB  Instruction Review Code 2- Demonstrated Understanding       Nutrition I: Fats -Discuss the types of cholesterol, what cholesterol does to the heart, and how cholesterol levels can be controlled.   Nutrition II: Labels -Discuss the different components of food labels and how to read food label   Heart Parts/Heart Disease and PAD -Discuss the anatomy of the heart, the pathway of blood circulation through the heart, and these are affected by heart disease.   Stress I: Signs and Symptoms -Discuss the causes of stress, how stress may lead to anxiety and depression, and ways to limit stress.   Stress II: Relaxation -Discuss different types of relaxation techniques to limit stress. Flowsheet Row CARDIAC REHAB PHASE II EXERCISE from 01/10/2023 in [place] CARDIAC REHABILITATION  Date  11/22/22  Educator HB  Instruction Review Code 2- Demonstrated Understanding       Warning Signs of Stroke / TIA -Discuss definition of a stroke, what the signs and symptoms are of a stroke, and how to identify when someone is having stroke. Flowsheet Row CARDIAC REHAB PHASE II EXERCISE from 01/10/2023 in [place] CARDIAC REHABILITATION  Date 11/29/22  Educator HB  Instruction Review Code 1- Verbalizes Understanding       Knowledge Questionnaire Score:  Knowledge Questionnaire Score - 11/16/22 1333       Knowledge Questionnaire Score  Pre Score 21/24             Core Components/Risk Factors/Patient Goals at Admission:  Personal Goals and Risk Factors at Admission - 11/16/22 1336       Core Components/Risk Factors/Patient Goals on Admission    Weight Management Weight Loss;Yes    Intervention Weight Management: Develop a combined nutrition and exercise program designed to reach desired caloric intake, while maintaining appropriate intake of nutrient and fiber, sodium and fats, and appropriate energy expenditure required for the weight goal.;Weight Management: Provide education and appropriate resources to help participant work on and attain dietary goals.;Weight Management/Obesity: Establish reasonable short term and long term weight goals.;Obesity: Provide education and appropriate resources to help participant work on and attain dietary goals.    Expected Outcomes Understanding of distribution of calorie intake throughout the day with the consumption of 4-5 meals/snacks;Understanding recommendations for meals to include 15-35% energy as protein, 25-35% energy from fat, 35-60% energy from carbohydrates, less than 200mg  of dietary cholesterol, 20-35 gm of total fiber daily;Weight Loss: Understanding of general recommendations for a balanced deficit meal plan, which promotes 1-2 lb weight loss per week and includes a negative energy balance of [phone] kcal/d;Weight Maintenance:  Understanding of the daily nutrition guidelines, which includes 25-35% calories from fat, 7% or less cal from saturated fats, less than 200mg  cholesterol, less than 1.5gm of sodium, & 5 or more servings of fruits and vegetables daily;Long Term: Adherence to nutrition and physical activity/exercise program aimed toward attainment of established weight goal;Short Term: Continue to assess and modify interventions until short term weight is achieved    Improve shortness of breath with ADL's Yes    Intervention Provide education, individualized exercise plan and daily activity instruction to help decrease symptoms of SOB with activities of daily living.    Expected Outcomes Short Term: Improve cardiorespiratory fitness to achieve a reduction of symptoms when performing ADLs;Long Term: Be able to perform more ADLs without symptoms or delay the onset of symptoms    Diabetes Yes    Intervention Provide education about signs/symptoms and action to take for hypo/hyperglycemia.;Provide education about proper nutrition, including hydration, and aerobic/resistive exercise prescription along with prescribed medications to achieve blood glucose in normal ranges: Fasting glucose 65-99 mg/dL    Expected Outcomes Short Term: Participant verbalizes understanding of the signs/symptoms and immediate care of hyper/hypoglycemia, proper foot care and importance of medication, aerobic/resistive exercise and nutrition plan for blood glucose control.;Long Term: Attainment of HbA1C < 7%.    Heart Failure Yes    Intervention Provide a combined exercise and nutrition program that is supplemented with education, support and counseling about heart failure. Directed toward relieving symptoms such as shortness of breath, decreased exercise tolerance, and extremity edema.    Expected Outcomes Improve functional capacity of life;Short term: Attendance in program 2-3 days a week with increased exercise capacity. Reported lower sodium intake.  Reported increased fruit and vegetable intake. Reports medication compliance.;Short term: Daily weights obtained and reported for increase. Utilizing diuretic protocols set by physician.;Long term: Adoption of self-care skills and reduction of barriers for early signs and symptoms recognition and intervention leading to self-care maintenance.    Hypertension Yes    Intervention Provide education on lifestyle modifcations including regular physical activity/exercise, weight management, moderate sodium restriction and increased consumption of fresh fruit, vegetables, and low fat dairy, alcohol moderation, and smoking cessation.;Monitor prescription use compliance.    Expected Outcomes Short Term: Continued assessment and intervention until BP is < 140/33mm HG in  hypertensive participants. < 130/51mm HG in hypertensive participants with diabetes, heart failure or chronic kidney disease.;Long Term: Maintenance of blood pressure at goal levels.    Personal Goal Other Yes    Personal Goal Get to feeling "back to normal", increase strength and endurance.    Intervention Pt will attend CR program 3 days a week and begin a home exercise program.    Expected Outcomes Pt will meet both her personal and program goals.             Core Components/Risk Factors/Patient Goals Review:   Goals and Risk Factor Review     Row Name 11/20/22 1256 12/11/22 1419 01/08/23 1228         Core Components/Risk Factors/Patient Goals Review   Personal Goals Review Weight Management/Obesity;Improve shortness of breath with ADL's;Diabetes;Hypertension;Heart Failure;Other Weight Management/Obesity;Improve shortness of breath with ADL's;Diabetes;Hypertension;Heart Failure;Other Weight Management/Obesity;Improve shortness of breath with ADL's;Diabetes;Hypertension;Heart Failure;Other     Review Patient was referred to CR with chronic systolic HF. She has multiple risk factors for CAD and is participating in the program for risk  modification. She has completed 1 sessions. Her current weight is 218.4 lbs. Her DM is managed with Toujeo; Jentadueto and [place].  She has no A1C on file. Her personal goals for the program are to lose weight; increase her strength and endurance and get back to feeling normal. We will continue to monitor her progress as she works towards meeting these goals. Patient has completed 8 sessions. Her current weight is 220.0 lbs up 1.6 lbs since last 30 day review.  She is doing well in the program with consistent attendance and progressions. Her DM continues to be managed with Toujeo; [place] and [place]. She has no A1C on file. She saw Dr. Jenene Slicker 4/9 for a routine check. She increased her Entresto and added Spironolactone 25 mg daily. Her blood pressure is at goal. Her personal goals for the program are to lose weight; increase her strength and endurance and get back to feeling normal. We will continue to monitor her progress as she works towards meeting these goals. Patient has completed 20 sessions. Her current weight is 220.5 lbs up 0.5 lbs since last 30 day review.  She continues to do well in the program with consistent attendance and progressions. Her DM continues to be managed with Toujeo and Jentadueto. Patient contacted Dr. Jenene Slicker 5/9 about the cost of farxiga and entresto. She said she could not afford them. Dr. Jenene Slicker discontinued both medications and added Lorsartan 50 mg daily. She has no A1C on file.  Her blood pressure continues to be at goal. Her personal goals for the program are to lose weight; increase her strength and endurance and get back to feeling normal. We will continue to monitor her progress as she works towards meeting these goals.     Expected Outcomes Patient will complete the program meeting both personal and program goals. Patient will complete the program meeting both personal and program goals. Patient will complete the program meeting both personal and program goals.               Core Components/Risk Factors/Patient Goals at Discharge (Final Review):   Goals and Risk Factor Review - 01/08/23 1228       Core Components/Risk Factors/Patient Goals Review   Personal Goals Review Weight Management/Obesity;Improve shortness of breath with ADL's;Diabetes;Hypertension;Heart Failure;Other    Review Patient has completed 20 sessions. Her current weight is 220.5 lbs up 0.5 lbs since last 30 day review.  She continues to do well in the program with consistent attendance and progressions. Her DM continues to be managed with Toujeo and Jentadueto. Patient contacted Dr. Jenene Slicker 5/9 about the cost of farxiga and entresto. She said she could not afford them. Dr. Jenene Slicker discontinued both medications and added Lorsartan 50 mg daily. She has no A1C on file.  Her blood pressure continues to be at goal. Her personal goals for the program are to lose weight; increase her strength and endurance and get back to feeling normal. We will continue to monitor her progress as she works towards meeting these goals.    Expected Outcomes Patient will complete the program meeting both personal and program goals.             ITP Comments:   Comments: ITP REVIEW Pt is making expected progress toward Cardiac Rehab goals after completing 23 sessions. Recommend continued exercise, life style modification, education, and increased stamina and strength.

## 2023-01-17 NOTE — Progress Notes (Signed)
Daily Session Note  Patient Details  Name: Raven Green MRN: [id number] Date of Birth: 29-Feb-1956 Referring Provider:   Flowsheet Row CARDIAC REHAB PHASE II ORIENTATION from 11/16/2022 in [facility] CARDIAC REHABILITATION  Referring Provider Dr. Jenene Slicker       Encounter Date: 01/17/2023  Check In:  Session Check In - 01/17/23 0930       Check-In   Supervising physician immediately available to respond to emergencies [facility] MD immediately available    Physician(s) Tenny Craw    Location [facility]    Staff Present Ross Ludwig, BS, Exercise Physiologist;Dalton Debbe Mounts, MS, ACSM-CEP    Virtual Visit No    Medication changes reported     No    Fall or balance concerns reported    No    Tobacco Cessation No Change    Warm-up and Cool-down Performed as group-led instruction    Resistance Training Performed Yes    VAD Patient? No    PAD/SET Patient? No      Pain Assessment   Currently in Pain? No/denies    Pain Score 0-No pain    Multiple Pain Sites No             Capillary Blood Glucose: No results found for this or any previous visit (from the past 24 hour(s)).    Social History   Tobacco Use  Smoking Status Never  Smokeless Tobacco Never    Goals Met:  Independence with exercise equipment Exercise tolerated well No report of concerns or symptoms today Strength training completed today  Goals Unmet:  Not Applicable  Comments: check out 1030   Dr. Dina Rich is Medical Director for [facility] Cardiac Rehab

## 2023-01-18 ENCOUNTER — Other Ambulatory Visit (HOSPITAL_COMMUNITY): Payer: Self-pay

## 2023-01-19 ENCOUNTER — Encounter (HOSPITAL_COMMUNITY)
Admission: RE | Admit: 2023-01-19 | Discharge: 2023-01-19 | Disposition: A | Discharge status: Home / Self Care | Payer: Medicare Other | Source: Ambulatory Visit | Attending: Internal Medicine | Admitting: Internal Medicine

## 2023-01-19 DIAGNOSIS — I5022 Chronic systolic (congestive) heart failure: Secondary | ICD-10-CM | POA: Diagnosis not present

## 2023-01-19 NOTE — Progress Notes (Signed)
Daily Session Note  Patient Details  Name: Raven Green MRN: [id number] Date of Birth: 15-Apr-1956 Referring Provider:   Flowsheet Row CARDIAC REHAB PHASE II ORIENTATION from 11/16/2022 in [facility] CARDIAC REHABILITATION  Referring Provider Dr. Jenene Slicker       Encounter Date: 01/19/2023  Check In:  Session Check In - 01/19/23 0928       Check-In   Supervising physician immediately available to respond to emergencies [facility] MD immediately available    Physician(s) Hilty    Location [facility]    Staff Present Ross Ludwig, BS, Exercise Physiologist;Dalton Debbe Mounts, MS, ACSM-CEP    Virtual Visit No    Medication changes reported     No    Fall or balance concerns reported    No    Tobacco Cessation No Change    Warm-up and Cool-down Performed as group-led instruction    Resistance Training Performed Yes    VAD Patient? No      Pain Assessment   Currently in Pain? No/denies    Pain Score 0-No pain    Multiple Pain Sites No             Capillary Blood Glucose: No results found for this or any previous visit (from the past 24 hour(s)).    Social History   Tobacco Use  Smoking Status Never  Smokeless Tobacco Never    Goals Met:  Independence with exercise equipment Exercise tolerated well No report of concerns or symptoms today Strength training completed today  Goals Unmet:  Not Applicable  Comments: check out 1030   Dr. Dina Rich is Medical Director for [facility] Cardiac Rehab

## 2023-01-22 ENCOUNTER — Encounter (HOSPITAL_COMMUNITY): Payer: Medicare Other

## 2023-01-24 ENCOUNTER — Encounter (HOSPITAL_COMMUNITY)
Admission: RE | Admit: 2023-01-24 | Discharge: 2023-01-24 | Disposition: A | Discharge status: Home / Self Care | Payer: Medicare Other | Source: Ambulatory Visit | Attending: Internal Medicine | Admitting: Internal Medicine

## 2023-01-24 DIAGNOSIS — I5022 Chronic systolic (congestive) heart failure: Secondary | ICD-10-CM | POA: Diagnosis not present

## 2023-01-24 NOTE — Progress Notes (Signed)
Daily Session Note  Patient Details  Name: MAYBLE HAHM MRN: [id number] Date of Birth: Sep 29, 1955 Referring Provider:   Flowsheet Row CARDIAC REHAB PHASE II ORIENTATION from 11/16/2022 in [facility] CARDIAC REHABILITATION  Referring Provider Dr. Jenene Slicker       Encounter Date: 01/24/2023  Check In:  Session Check In - 01/24/23 0930       Check-In   Supervising physician immediately available to respond to emergencies [facility] MD immediately available    Physician(s) Dr. Dina Rich    Location [facility]    Staff Present Ross Ludwig, BS, Exercise Physiologist;Kroy Sprung Laural Benes, RN, BSN;Hillary Troutman BSN, RN;Daphyne Daphine Deutscher, RN, BSN    Virtual Visit No    Medication changes reported     No    Fall or balance concerns reported    No    Tobacco Cessation No Change    Warm-up and Cool-down Performed as group-led Writer Performed Yes    VAD Patient? No    PAD/SET Patient? No      Pain Assessment   Currently in Pain? No/denies    Pain Score 0-No pain    Multiple Pain Sites No             Capillary Blood Glucose: No results found for this or any previous visit (from the past 24 hour(s)).    Social History   Tobacco Use  Smoking Status Never  Smokeless Tobacco Never    Goals Met:  Independence with exercise equipment Exercise tolerated well No report of concerns or symptoms today Strength training completed today  Goals Unmet:  Not Applicable  Comments: Check out 1030.   Dr. Dina Rich is Medical Director for [facility] Cardiac Rehab

## 2023-01-26 ENCOUNTER — Encounter (HOSPITAL_COMMUNITY)
Admission: RE | Admit: 2023-01-26 | Discharge: 2023-01-26 | Disposition: A | Discharge status: Home / Self Care | Payer: Medicare Other | Source: Ambulatory Visit | Attending: Internal Medicine | Admitting: Internal Medicine

## 2023-01-26 DIAGNOSIS — I5022 Chronic systolic (congestive) heart failure: Secondary | ICD-10-CM

## 2023-01-26 NOTE — Progress Notes (Signed)
Daily Session Note  Patient Details  Name: Raven Green MRN: [id number] Date of Birth: April 15, 1956 Referring Provider:   Flowsheet Row CARDIAC REHAB PHASE II ORIENTATION from 11/16/2022 in [facility] CARDIAC REHABILITATION  Referring Provider Dr. Jenene Slicker       Encounter Date: 01/26/2023  Check In:  Session Check In - 01/26/23 0930       Check-In   Supervising physician immediately available to respond to emergencies [facility] MD immediately available    Physician(s) Dr. Dina Rich    Location [facility]    Staff Present Cristopher Peru MHA, MS, ACSM-CEP;Anitta Tenny Laural Benes, RN, Pleas Koch, RN, BSN    Virtual Visit No    Medication changes reported     No    Fall or balance concerns reported    No    Tobacco Cessation No Change    Warm-up and Cool-down Performed as group-led instruction    Resistance Training Performed Yes    VAD Patient? No    PAD/SET Patient? No      Pain Assessment   Currently in Pain? No/denies    Pain Score 0-No pain    Multiple Pain Sites No             Capillary Blood Glucose: No results found for this or any previous visit (from the past 24 hour(s)).    Social History   Tobacco Use  Smoking Status Never  Smokeless Tobacco Never    Goals Met:  Independence with exercise equipment Exercise tolerated well No report of concerns or symptoms today Strength training completed today  Goals Unmet:  Not Applicable  Comments: Check out 1030.   Dr. Dina Rich is Medical Director for [facility] Cardiac Rehab

## 2023-01-29 ENCOUNTER — Encounter (HOSPITAL_COMMUNITY)
Admission: RE | Admit: 2023-01-29 | Discharge: 2023-01-29 | Disposition: A | Discharge status: Home / Self Care | Payer: Medicare Other | Source: Ambulatory Visit | Attending: Internal Medicine | Admitting: Internal Medicine

## 2023-01-29 VITALS — Wt 225.8 lb

## 2023-01-29 DIAGNOSIS — I5022 Chronic systolic (congestive) heart failure: Secondary | ICD-10-CM | POA: Diagnosis not present

## 2023-01-29 NOTE — Progress Notes (Signed)
Daily Session Note  Patient Details  Name: Raven Green MRN: [id number] Date of Birth: September 09, 1955 Referring Provider:   Flowsheet Row CARDIAC REHAB PHASE II ORIENTATION from 11/16/2022 in [facility] CARDIAC REHABILITATION  Referring Provider Dr. Jenene Slicker       Encounter Date: 01/29/2023  Check In:  Session Check In - 01/29/23 0930       Check-In   Supervising physician immediately available to respond to emergencies [facility] MD immediately available    Physician(s) Dr. Dietrich Pates    Location [facility]    Staff Present Ross Ludwig, BS, Exercise Physiologist;Derrius Furtick Laural Benes, RN, Pleas Koch, RN, BSN    Virtual Visit No    Medication changes reported     No    Fall or balance concerns reported    No    Tobacco Cessation No Change    Warm-up and Cool-down Performed as group-led instruction    Resistance Training Performed Yes    VAD Patient? No    PAD/SET Patient? No      Pain Assessment   Currently in Pain? No/denies    Pain Score 0-No pain    Multiple Pain Sites No             Capillary Blood Glucose: No results found for this or any previous visit (from the past 24 hour(s)).    Social History   Tobacco Use  Smoking Status Never  Smokeless Tobacco Never    Goals Met:  Independence with exercise equipment Exercise tolerated well No report of concerns or symptoms today Strength training completed today  Goals Unmet:  Not Applicable  Comments: Check out 1030.   Dr. Dina Rich is Medical Director for [facility] Cardiac Rehab

## 2023-01-31 ENCOUNTER — Encounter (HOSPITAL_COMMUNITY)
Admission: RE | Admit: 2023-01-31 | Discharge: 2023-01-31 | Disposition: A | Discharge status: Home / Self Care | Payer: Medicare Other | Source: Ambulatory Visit | Attending: Internal Medicine | Admitting: Internal Medicine

## 2023-01-31 DIAGNOSIS — I5022 Chronic systolic (congestive) heart failure: Secondary | ICD-10-CM

## 2023-01-31 NOTE — Progress Notes (Signed)
Daily Session Note  Patient Details  Name: DIVA MCNEFF MRN: [id number] Date of Birth: 1956/06/02 Referring Provider:   Flowsheet Row CARDIAC REHAB PHASE II ORIENTATION from 11/16/2022 in [facility] CARDIAC REHABILITATION  Referring Provider Dr. Jenene Slicker       Encounter Date: 01/31/2023  Check In:  Session Check In - 01/31/23 0930       Check-In   Supervising physician immediately available to respond to emergencies [facility] MD immediately available    Physician(s) Dr. Jenene Slicker    Location [facility]    Staff Present Ross Ludwig, BS, Exercise Physiologist;Adalaide Jaskolski Laural Benes, RN, BSN;Hillary Troutman BSN, RN    Virtual Visit No    Medication changes reported     No    Fall or balance concerns reported    No    Tobacco Cessation No Change    Warm-up and Cool-down Performed as group-led Writer Performed Yes    VAD Patient? No    PAD/SET Patient? No      Pain Assessment   Currently in Pain? No/denies    Pain Score 0-No pain    Multiple Pain Sites No             Capillary Blood Glucose: No results found for this or any previous visit (from the past 24 hour(s)).    Social History   Tobacco Use  Smoking Status Never  Smokeless Tobacco Never    Goals Met:  Independence with exercise equipment Exercise tolerated well No report of concerns or symptoms today Strength training completed today  Goals Unmet:  Not Applicable  Comments: Check out 1030.   Dr. Dina Rich is Medical Director for [facility] Cardiac Re[facility]

## 2023-02-02 ENCOUNTER — Encounter (HOSPITAL_COMMUNITY): Payer: Medicare Other

## 2023-02-05 ENCOUNTER — Encounter (HOSPITAL_COMMUNITY): Payer: Medicare Other

## 2023-02-07 ENCOUNTER — Encounter (HOSPITAL_COMMUNITY): Payer: Medicare Other

## 2023-02-09 ENCOUNTER — Encounter (HOSPITAL_COMMUNITY): Payer: Medicare Other

## 2023-02-12 ENCOUNTER — Encounter (HOSPITAL_COMMUNITY)
Admission: RE | Admit: 2023-02-12 | Discharge: 2023-02-12 | Disposition: A | Discharge status: Home / Self Care | Payer: Medicare Other | Source: Ambulatory Visit | Attending: Internal Medicine | Admitting: Internal Medicine

## 2023-02-12 VITALS — Wt 227.7 lb

## 2023-02-12 DIAGNOSIS — I5022 Chronic systolic (congestive) heart failure: Secondary | ICD-10-CM | POA: Diagnosis not present

## 2023-02-12 NOTE — Progress Notes (Signed)
Daily Session Note  Patient Details  Name: REIKA PROSE MRN: [id number] Date of Birth: [birth year] Referring Provider:   Flowsheet Row CARDIAC REHAB PHASE II ORIENTATION from 11/16/2022 in [facility] CARDIAC REHABILITATION  Referring Provider Dr. Jenene Slicker       Encounter Date: 02/12/2023  Check In:  Session Check In - 02/12/23 0930       Check-In   Supervising physician immediately available to respond to emergencies [facility] MD immediately available    Physician(s) Dr. Tenny Craw    Location [facility]    Staff Present Ross Ludwig, BS, Exercise Physiologist;Debra Laural Benes, RN, BSN    Virtual Visit No    Medication changes reported     No    Fall or balance concerns reported    No    Tobacco Cessation No Change    Warm-up and Cool-down Performed as group-led instruction    Resistance Training Performed Yes    VAD Patient? No    PAD/SET Patient? No      Pain Assessment   Currently in Pain? No/denies    Pain Score 0-No pain    Multiple Pain Sites No             Capillary Blood Glucose: No results found for this or any previous visit (from the past 24 hour(s)).    Social History   Tobacco Use  Smoking Status Never  Smokeless Tobacco Never    Goals Met:  Independence with exercise equipment Exercise tolerated well No report of concerns or symptoms today Strength training completed today  Goals Unmet:  Not Applicable  Comments: check out 1030   Dr. Dina Rich is Medical Director for [facility] Cardiac Rehab

## 2023-02-14 ENCOUNTER — Encounter (HOSPITAL_COMMUNITY)
Admission: RE | Admit: 2023-02-14 | Discharge: 2023-02-14 | Disposition: A | Discharge status: Home / Self Care | Payer: Medicare Other | Source: Ambulatory Visit | Attending: Internal Medicine | Admitting: Internal Medicine

## 2023-02-14 DIAGNOSIS — I5022 Chronic systolic (congestive) heart failure: Secondary | ICD-10-CM

## 2023-02-14 NOTE — Progress Notes (Signed)
Cardiac Individual Treatment Plan  Patient Details  Name: Raven Green MRN: [id number] Date of Birth: [birth year] Referring Provider:   Flowsheet Row CARDIAC REHAB PHASE II ORIENTATION from 11/16/2022 in [facility] CARDIAC REHABILITATION  Referring Provider Dr. Jenene Slicker       Initial Encounter Date:  Flowsheet Row CARDIAC REHAB PHASE II ORIENTATION from 11/16/2022 in [place] CARDIAC REHABILITATION  Date 11/16/22       Visit Diagnosis: Heart failure, chronic systolic (HCC)  Patient's Home Medications on Admission:  Current Outpatient Medications:    dapagliflozin propanediol (FARXIGA) 10 MG TABS tablet, Take 1 tablet (10 mg total) by mouth daily before breakfast., Disp: 30 tablet, Rfl: 6   furosemide (LASIX) 40 MG tablet, Take 1 tablet (40 mg total) by mouth daily., Disp: 30 tablet, Rfl: 11   linaGLIPtin-metFORMIN HCl ER (JENTADUETO XR) 12-998 MG TB24, Take 1 tablet by mouth daily at 6 PM., Disp: , Rfl:    losartan (COZAAR) 50 MG tablet, Take 1 tablet (50 mg total) by mouth daily., Disp: 30 tablet, Rfl: 6   metoprolol tartrate (LOPRESSOR) 25 MG tablet, Take 1 tablet (25 mg total) by mouth 2 (two) times daily., Disp: 180 tablet, Rfl: 3   nitroGLYCERIN (NITROSTAT) 0.4 MG SL tablet, Place 1 tablet (0.4 mg total) under the tongue every 5 (five) minutes as needed for chest pain., Disp: 25 tablet, Rfl: 3   potassium chloride SA (KLOR-CON M) 20 MEQ tablet, Take 2 tablets (40 mEq total) by mouth 2 (two) times daily for 3 days, THEN 1 tablet (20 mEq total) 2 (two) times daily. (Patient taking differently: Take 1 tablet (20 mEq total) 2 (two) times daily.), Disp: 180 tablet, Rfl: 3   spironolactone (ALDACTONE) 25 MG tablet, Take 1 tablet (25 mg total) by mouth daily., Disp: 90 tablet, Rfl: 1   TOUJEO MAX SOLOSTAR 300 UNIT/ML Solostar Pen, Inject 44 Units into the skin daily at 10 pm., Disp: , Rfl:   Past Medical History: Past Medical History:  Diagnosis Date   Arthritis    Diabetes  mellitus without complication (HCC)    gestational   Endometriosis    History of migraine headaches    Hypertension    IBS (irritable bowel syndrome)     Tobacco Use: Social History   Tobacco Use  Smoking Status Never  Smokeless Tobacco Never    Labs: Review Flowsheet       Latest Ref Rng & Units 08/13/2013 10/12/2022  Labs for ITP Cardiac and Pulmonary Rehab  PH, Arterial 7.35 - 7.45 - 7.394   PCO2 arterial 32 - 48 mmHg - 45.4   Bicarbonate 20.0 - 28.0 mmol/L - 27.7  28.4  27.8   TCO2 22 - 32 mmol/L 28  29  30  29    O2 Saturation % - 95  72  72     Capillary Blood Glucose: Lab Results  Component Value Date   GLUCAP 155 (H) 11/16/2022   GLUCAP 147 (H) 04/20/2021   GLUCAP 136 (H) 08/13/2013    POCT Glucose     Row Name 11/20/22 1039             POCT Blood Glucose   Pre-Exercise 213 mg/dL                Exercise Target Goals: Exercise Program Goal: Individual exercise prescription set using results from initial 6 min walk test and THRR while considering  patient's activity barriers and safety.   Exercise Prescription Goal: Starting with aerobic  activity 30 plus minutes a day, 3 days per week for initial exercise prescription. Provide home exercise prescription and guidelines that participant acknowledges understanding prior to discharge.  Activity Barriers & Risk Stratification:  Activity Barriers & Cardiac Risk Stratification - 11/16/22 1323       Activity Barriers & Cardiac Risk Stratification   Activity Barriers Arthritis;Back Problems;Neck/Spine Problems;Joint Problems;Deconditioning;Decreased Ventricular Function    Cardiac Risk Stratification High             6 Minute Walk:  6 Minute Walk     Row Name 11/16/22 1316 12/04/22 1241       6 Minute Walk   Phase Initial Initial    Distance 1100 feet --    Walk Time 6 minutes --    # of Rest Breaks 0 --    MPH 2.08 --    METS 2.54 --    RPE 12 --    VO2 Peak 8.89 --    Symptoms No  --    Resting HR 75 bpm --    Resting BP 112/60 --    Resting Oxygen Saturation  95 % --    Exercise Oxygen Saturation  during 6 min walk 96 % --    Max Ex. HR 98 bpm --    Max Ex. BP 140/60 --    2 Minute Post BP 120/60 --             Oxygen Initial Assessment:   Oxygen Re-Evaluation:   Oxygen Discharge (Final Oxygen Re-Evaluation):   Initial Exercise Prescription:  Initial Exercise Prescription - 11/16/22 1300       Date of Initial Exercise RX and Referring Provider   Date 11/16/22    Referring Provider Dr. Jenene Slicker    Expected Discharge Date 02/16/23      NuStep   Level 1    SPM 80    Minutes 39      Prescription Details   Frequency (times per week) 3    Duration Progress to 30 minutes of continuous aerobic without signs/symptoms of physical distress      Intensity   THRR 40-80% of Max Heartrate 63-123    Ratings of Perceived Exertion 11-13      Resistance Training   Training Prescription Yes    Weight 0    Reps 10-15             Perform Capillary Blood Glucose checks as needed.  Exercise Prescription Changes:   Exercise Prescription Changes     Row Name 11/20/22 1000 12/04/22 1200 12/18/22 1300 01/01/23 1400 01/15/23 1200     Response to Exercise   Blood Pressure (Admit) 112/60 126/70 124/60 126/64 114/70   Blood Pressure (Exercise) 132/74 142/66 126/58 130/60 118/72   Blood Pressure (Exit) 112/62 124/60 114/60 122/62 112/66   Heart Rate (Admit) 85 bpm 73 bpm 79 bpm 81 bpm 75 bpm   Heart Rate (Exercise) 98 bpm 101 bpm 98 bpm 95 bpm 91 bpm   Heart Rate (Exit) 95 bpm 82 bpm 88 bpm 87 bpm 83 bpm   Rating of Perceived Exertion (Exercise) 12 12 12 12 12    Duration Continue with 30 min of aerobic exercise without signs/symptoms of physical distress. Continue with 30 min of aerobic exercise without signs/symptoms of physical distress. Continue with 30 min of aerobic exercise without signs/symptoms of physical distress. Continue with 30 min of  aerobic exercise without signs/symptoms of physical distress. Continue with 30 min of aerobic exercise without signs/symptoms of  physical distress.   Intensity THRR unchanged THRR unchanged THRR unchanged THRR unchanged THRR unchanged     Progression   Progression -- Continue to progress workloads to maintain intensity without signs/symptoms of physical distress. Continue to progress workloads to maintain intensity without signs/symptoms of physical distress. Continue to progress workloads to maintain intensity without signs/symptoms of physical distress. Continue to progress workloads to maintain intensity without signs/symptoms of physical distress.     Resistance Training   Training Prescription Yes Yes Yes Yes Yes   Weight 3 2 2 2 2    Reps 10-15 10-15 10-15 10-15 10-15   Time 10 Minutes 10 Minutes 10 Minutes 10 Minutes 10 Minutes     NuStep   Level 1 1 1 2 2    SPM 80 94 100 92 98   Minutes 39 39 39 39 39   METs 1.96 2.23 2.23 2.19 2.25    Row Name 01/29/23 1200 02/12/23 1200           Response to Exercise   Blood Pressure (Admit) 102/64 110/60      Blood Pressure (Exercise) 120/64 122/60      Blood Pressure (Exit) 116/62 118/60      Heart Rate (Admit) 71 bpm 81 bpm      Heart Rate (Exercise) 79 bpm 83 bpm      Heart Rate (Exit) 80 bpm 83 bpm      Rating of Perceived Exertion (Exercise) 12 12      Duration Continue with 30 min of aerobic exercise without signs/symptoms of physical distress. Continue with 30 min of aerobic exercise without signs/symptoms of physical distress.      Intensity THRR unchanged THRR unchanged        Progression   Progression Continue to progress workloads to maintain intensity without signs/symptoms of physical distress. Continue to progress workloads to maintain intensity without signs/symptoms of physical distress.        Resistance Training   Training Prescription Yes Yes      Weight 2 2      Reps 10-15 10-15      Time 10 Minutes 10 Minutes         NuStep   Level 2 2      SPM 97 99      Minutes 39 39      METs 2.42 2.36               Exercise Comments:   Exercise Goals and Review:   Exercise Goals     Row Name 11/16/22 1319 11/20/22 1041 12/18/22 1335 01/16/23 0758 02/12/23 1221     Exercise Goals   Increase Physical Activity Yes Yes Yes Yes Yes   Intervention Provide advice, education, support and counseling about physical activity/exercise needs.;Develop an individualized exercise prescription for aerobic and resistive training based on initial evaluation findings, risk stratification, comorbidities and participant's personal goals. Provide advice, education, support and counseling about physical activity/exercise needs.;Develop an individualized exercise prescription for aerobic and resistive training based on initial evaluation findings, risk stratification, comorbidities and participant's personal goals. Provide advice, education, support and counseling about physical activity/exercise needs.;Develop an individualized exercise prescription for aerobic and resistive training based on initial evaluation findings, risk stratification, comorbidities and participant's personal goals. Provide advice, education, support and counseling about physical activity/exercise needs.;Develop an individualized exercise prescription for aerobic and resistive training based on initial evaluation findings, risk stratification, comorbidities and participant's personal goals. Provide advice, education, support and counseling about physical activity/exercise needs.;Develop an  individualized exercise prescription for aerobic and resistive training based on initial evaluation findings, risk stratification, comorbidities and participant's personal goals.   Expected Outcomes Short Term: Attend rehab on a regular basis to increase amount of physical activity.;Long Term: Add in home exercise to make exercise part of routine and to increase amount of  physical activity.;Long Term: Exercising regularly at least 3-5 days a week. Short Term: Attend rehab on a regular basis to increase amount of physical activity.;Long Term: Add in home exercise to make exercise part of routine and to increase amount of physical activity.;Long Term: Exercising regularly at least 3-5 days a week. Short Term: Attend rehab on a regular basis to increase amount of physical activity.;Long Term: Add in home exercise to make exercise part of routine and to increase amount of physical activity.;Long Term: Exercising regularly at least 3-5 days a week. Short Term: Attend rehab on a regular basis to increase amount of physical activity.;Long Term: Add in home exercise to make exercise part of routine and to increase amount of physical activity.;Long Term: Exercising regularly at least 3-5 days a week. Short Term: Attend rehab on a regular basis to increase amount of physical activity.;Long Term: Add in home exercise to make exercise part of routine and to increase amount of physical activity.;Long Term: Exercising regularly at least 3-5 days a week.   Increase Strength and Stamina Yes Yes Yes Yes Yes   Intervention Provide advice, education, support and counseling about physical activity/exercise needs.;Develop an individualized exercise prescription for aerobic and resistive training based on initial evaluation findings, risk stratification, comorbidities and participant's personal goals. Provide advice, education, support and counseling about physical activity/exercise needs.;Develop an individualized exercise prescription for aerobic and resistive training based on initial evaluation findings, risk stratification, comorbidities and participant's personal goals. Provide advice, education, support and counseling about physical activity/exercise needs.;Develop an individualized exercise prescription for aerobic and resistive training based on initial evaluation findings, risk stratification,  comorbidities and participant's personal goals. Provide advice, education, support and counseling about physical activity/exercise needs.;Develop an individualized exercise prescription for aerobic and resistive training based on initial evaluation findings, risk stratification, comorbidities and participant's personal goals. Provide advice, education, support and counseling about physical activity/exercise needs.;Develop an individualized exercise prescription for aerobic and resistive training based on initial evaluation findings, risk stratification, comorbidities and participant's personal goals.   Expected Outcomes Short Term: Increase workloads from initial exercise prescription for resistance, speed, and METs.;Short Term: Perform resistance training exercises routinely during rehab and add in resistance training at home;Long Term: Improve cardiorespiratory fitness, muscular endurance and strength as measured by increased METs and functional capacity ( ) Short Term: Increase workloads from initial exercise prescription for resistance, speed, and METs.;Short Term: Perform resistance training exercises routinely during rehab and add in resistance training at home;Long Term: Improve cardiorespiratory fitness, muscular endurance and strength as measured by increased METs and functional capacity ( ) Short Term: Increase workloads from initial exercise prescription for resistance, speed, and METs.;Short Term: Perform resistance training exercises routinely during rehab and add in resistance training at home;Long Term: Improve cardiorespiratory fitness, muscular endurance and strength as measured by increased METs and functional capacity ( ) Short Term: Increase workloads from initial exercise prescription for resistance, speed, and METs.;Short Term: Perform resistance training exercises routinely during rehab and add in resistance training at home;Long Term: Improve cardiorespiratory fitness, muscular  endurance and strength as measured by increased METs and functional capacity ( ) Short Term: Increase workloads from initial exercise prescription for resistance, speed, and METs.;Short Term: Perform  resistance training exercises routinely during rehab and add in resistance training at home;Long Term: Improve cardiorespiratory fitness, muscular endurance and strength as measured by increased METs and functional capacity ( )   Able to understand and use rate of perceived exertion (RPE) scale Yes Yes Yes Yes Yes   Intervention Provide education and explanation on how to use RPE scale Provide education and explanation on how to use RPE scale Provide education and explanation on how to use RPE scale Provide education and explanation on how to use RPE scale Provide education and explanation on how to use RPE scale   Expected Outcomes Short Term: Able to use RPE daily in rehab to express subjective intensity level;Long Term:  Able to use RPE to guide intensity level when exercising independently Short Term: Able to use RPE daily in rehab to express subjective intensity level;Long Term:  Able to use RPE to guide intensity level when exercising independently Short Term: Able to use RPE daily in rehab to express subjective intensity level;Long Term:  Able to use RPE to guide intensity level when exercising independently Short Term: Able to use RPE daily in rehab to express subjective intensity level;Long Term:  Able to use RPE to guide intensity level when exercising independently Short Term: Able to use RPE daily in rehab to express subjective intensity level;Long Term:  Able to use RPE to guide intensity level when exercising independently   Knowledge and understanding of Target Heart Rate Range (THRR) Yes Yes Yes Yes Yes   Intervention Provide education and explanation of THRR including how the numbers were predicted and where they are located for reference Provide education and explanation of THRR including  how the numbers were predicted and where they are located for reference Provide education and explanation of THRR including how the numbers were predicted and where they are located for reference Provide education and explanation of THRR including how the numbers were predicted and where they are located for reference Provide education and explanation of THRR including how the numbers were predicted and where they are located for reference   Expected Outcomes Short Term: Able to state/look up THRR;Short Term: Able to use daily as guideline for intensity in rehab;Long Term: Able to use THRR to govern intensity when exercising independently Short Term: Able to state/look up THRR;Short Term: Able to use daily as guideline for intensity in rehab;Long Term: Able to use THRR to govern intensity when exercising independently Short Term: Able to state/look up THRR;Short Term: Able to use daily as guideline for intensity in rehab;Long Term: Able to use THRR to govern intensity when exercising independently Short Term: Able to state/look up THRR;Short Term: Able to use daily as guideline for intensity in rehab;Long Term: Able to use THRR to govern intensity when exercising independently Short Term: Able to state/look up THRR;Short Term: Able to use daily as guideline for intensity in rehab;Long Term: Able to use THRR to govern intensity when exercising independently   Able to check pulse independently Yes Yes Yes Yes Yes   Intervention Provide education and demonstration on how to check pulse in carotid and radial arteries.;Review the importance of being able to check your own pulse for safety during independent exercise Provide education and demonstration on how to check pulse in carotid and radial arteries.;Review the importance of being able to check your own pulse for safety during independent exercise Provide education and demonstration on how to check pulse in carotid and radial arteries.;Review the importance of  being able to check your  own pulse for safety during independent exercise Provide education and demonstration on how to check pulse in carotid and radial arteries.;Review the importance of being able to check your own pulse for safety during independent exercise Provide education and demonstration on how to check pulse in carotid and radial arteries.;Review the importance of being able to check your own pulse for safety during independent exercise   Expected Outcomes Short Term: Able to explain why pulse checking is important during independent exercise;Long Term: Able to check pulse independently and accurately Short Term: Able to explain why pulse checking is important during independent exercise;Long Term: Able to check pulse independently and accurately Short Term: Able to explain why pulse checking is important during independent exercise;Long Term: Able to check pulse independently and accurately Short Term: Able to explain why pulse checking is important during independent exercise;Long Term: Able to check pulse independently and accurately Short Term: Able to explain why pulse checking is important during independent exercise;Long Term: Able to check pulse independently and accurately   Understanding of Exercise Prescription Yes Yes Yes Yes Yes   Intervention Provide education, explanation, and written materials on patient's individual exercise prescription Provide education, explanation, and written materials on patient's individual exercise prescription Provide education, explanation, and written materials on patient's individual exercise prescription Provide education, explanation, and written materials on patient's individual exercise prescription Provide education, explanation, and written materials on patient's individual exercise prescription   Expected Outcomes Short Term: Able to explain program exercise prescription;Long Term: Able to explain home exercise prescription to exercise independently  Short Term: Able to explain program exercise prescription;Long Term: Able to explain home exercise prescription to exercise independently Short Term: Able to explain program exercise prescription;Long Term: Able to explain home exercise prescription to exercise independently Short Term: Able to explain program exercise prescription;Long Term: Able to explain home exercise prescription to exercise independently Short Term: Able to explain program exercise prescription;Long Term: Able to explain home exercise prescription to exercise independently            Exercise Goals Re-Evaluation :  Exercise Goals Re-Evaluation     Row Name 11/20/22 1043 12/18/22 1335 01/16/23 0758 02/12/23 1221       Exercise Goal Re-Evaluation   Exercise Goals Review Increase Physical Activity;Increase Strength and Stamina;Able to understand and use rate of perceived exertion (RPE) scale;Knowledge and understanding of Target Heart Rate Range (THRR);Able to check pulse independently;Understanding of Exercise Prescription Increase Physical Activity;Increase Strength and Stamina;Able to understand and use rate of perceived exertion (RPE) scale;Knowledge and understanding of Target Heart Rate Range (THRR);Able to check pulse independently;Understanding of Exercise Prescription Increase Strength and Stamina;Increase Physical Activity;Able to understand and use rate of perceived exertion (RPE) scale;Knowledge and understanding of Target Heart Rate Range (THRR);Able to check pulse independently;Understanding of Exercise Prescription Increase Physical Activity;Increase Strength and Stamina;Able to understand and use rate of perceived exertion (RPE) scale;Knowledge and understanding of Target Heart Rate Range (THRR);Able to check pulse independently;Understanding of Exercise Prescription    Comments Pt has completed 1 exercise session of CR. She is limited by what she described as muscle pain/stiffness that she attributes to Lasix. She  has been very sedentary over the past couple of months. She exercised at 1.96 METs on the stepper during her first session. Will continue to monitor and progress as able. Pt has completed 12 sessions of cardiac rehab. She still continues to be limited due to muscle pain/stiffness that she attribytes to Lasix. She is progressing slow due to being deconditioned. She  has increased her SPM on the stepper and currently exercising at  2.23 METs on the stepper. WIll continue to monitor and progress as able. Pt has completed 23 sessions of cardiac rehab. She continues to be limited due to ongoing mucles pain/stiffness. She continues to progress slowly and has not wanted to increase her level on the stepper after being asked. She is currently exercising at 2.25 METs on the stepper. Will continue to monitor and progress as able. Pt has completed 30 sessions of cardiac rehab, She continues to progress in class but at a slow rate. She is finishing up with rehab next week and is thinking about joining the [employer] She is currently exercising at 2.36 METs on the stepper. Will continue to monitor and progress as able.    Expected Outcomes Through exercise at rehab and at home, the patient will meet their stated goals. Through exercise at rehab and at home, the patient will meet their stated goals. Through exercise at rehab and at home, the patient will meet their stated goals. Through exercise at rehab and at home, the patient will meet their stated goals.              Discharge Exercise Prescription (Final Exercise Prescription Changes):  Exercise Prescription Changes - 02/12/23 1200       Response to Exercise   Blood Pressure (Admit) 110/60    Blood Pressure (Exercise) 122/60    Blood Pressure (Exit) 118/60    Heart Rate (Admit) 81 bpm    Heart Rate (Exercise) 83 bpm    Heart Rate (Exit) 83 bpm    Rating of Perceived Exertion (Exercise) 12    Duration Continue with 30 min of aerobic exercise  without signs/symptoms of physical distress.    Intensity THRR unchanged      Progression   Progression Continue to progress workloads to maintain intensity without signs/symptoms of physical distress.      Resistance Training   Training Prescription Yes    Weight 2    Reps 10-15    Time 10 Minutes      NuStep   Level 2    SPM 99    Minutes 39    METs 2.36             Nutrition:  Target Goals: Understanding of nutrition guidelines, daily intake of sodium 1500mg , cholesterol 200mg , calories 30% from fat and 7% or less from saturated fats, daily to have 5 or more servings of fruits and vegetables.  Biometrics:  Pre Biometrics - 11/16/22 1406       Pre Biometrics   Height 5\' 4"  (1.626 m)    Weight 100 kg    BMI (Calculated) 37.82              Nutrition Therapy Plan and Nutrition Goals:  Nutrition Therapy & Goals - 02/05/23 0728       Nutrition Therapy   RD appointment deferred Yes      Personal Nutrition Goals   Comments We provide educational sessions on heart healthy nutrition with handouts and assistance with RD referral if patient is interested.      Intervention Plan   Intervention Nutrition handout(s) given to patient.    Expected Outcomes Short Term Goal: Understand basic principles of dietary content, such as calories, fat, sodium, cholesterol and nutrients.             Nutrition Assessments:  Nutrition Assessments - 11/16/22 1330       MEDFICTS Scores  Pre Score 56            MEDIFICTS Score Key: ?70 Need to make dietary changes  40-70 Heart Healthy Diet ? 40 Therapeutic Level Cholesterol Diet   Picture Your Plate Scores: <81 Unhealthy dietary pattern with much room for improvement. 41-50 Dietary pattern unlikely to meet recommendations for good health and room for improvement. 51-60 More healthful dietary pattern, with some room for improvement.  >60 Healthy dietary pattern, although there may be some specific behaviors  that could be improved.    Nutrition Goals Re-Evaluation:   Nutrition Goals Discharge (Final Nutrition Goals Re-Evaluation):   Psychosocial: Target Goals: Acknowledge presence or absence of significant depression and/or stress, maximize coping skills, provide positive support system. Participant is able to verbalize types and ability to use techniques and skills needed for reducing stress and depression.  Initial Review & Psychosocial Screening:  Initial Psych Review & Screening - 11/16/22 1328       Initial Review   Current issues with None Identified      Family Dynamics   Good Support System? Yes    Comments pt's husband      Barriers   Psychosocial barriers to participate in program There are no identifiable barriers or psychosocial needs.      Screening Interventions   Interventions Encouraged to exercise;Provide feedback about the scores to participant;Other (comment)    Expected Outcomes Short Term goal: Identification and review with participant of any Quality of Life or Depression concerns found by scoring the questionnaire.;Long Term goal: The participant improves quality of Life and PHQ9 Scores as seen by post scores and/or verbalization of changes             Quality of Life Scores:  Quality of Life - 11/16/22 1333       Quality of Life   Select Quality of Life      Quality of Life Scores   Health/Function Pre 14.5 %    Socioeconomic Pre 30 %    Psych/Spiritual Pre 19.71 %    Family Pre 21.6 %    GLOBAL Pre 19.5 %            Scores of 19 and below usually indicate a poorer quality of life in these areas.  A difference of  2-3 points is a clinically meaningful difference.  A difference of 2-3 points in the total score of the Quality of Life Index has been associated with significant improvement in overall quality of life, self-image, physical symptoms, and general health in studies assessing change in quality of life.  PHQ-9: Review Flowsheet        11/16/2022  Depression screen PHQ 2/9  Decreased Interest 1  Down, Depressed, Hopeless 0  PHQ - 2 Score 1  Altered sleeping 1  Tired, decreased energy 3  Change in appetite 2  Feeling bad or failure about yourself  0  Trouble concentrating 0  Moving slowly or fidgety/restless 0  Suicidal thoughts 0  PHQ-9 Score 7  Difficult doing work/chores Somewhat difficult   Interpretation of Total Score  Total Score Depression Severity:  1-4 = Minimal depression, 5-9 = Mild depression, 10-14 = Moderate depression, 15-19 = Moderately severe depression, 20-27 = Severe depression   Psychosocial Evaluation and Intervention:  Psychosocial Evaluation - 11/16/22 1447       Psychosocial Evaluation & Interventions   Interventions Stress management education;Relaxation education;Encouraged to exercise with the program and follow exercise prescription    Comments Patient has no identifiable  psychosocial issues.  She has a good support system with her husband.  She has no other family in this area since she and her husband left [place] for her husband's job 40 years ago.  She scored a 7 on her PHQ-9, which relates to her fatigue, overeating, and trouble sleeping when she takes Lasix.  She is currently taking Lasix every few days, since the medication causes muscle aches in her legs and buttocks.  Her goals are to lose weight, increase her strength and endurance, and get back to "feeling normal."  We will continue to monitor her progress toward meeting these goals.    Expected Outcomes The pt will continue to have no identifiable psychosocial issues.    Continue Psychosocial Services  No Follow up required             Psychosocial Re-Evaluation:  Psychosocial Re-Evaluation     Row Name 11/20/22 1254 12/11/22 1418 01/08/23 1228 02/05/23 1610       Psychosocial Re-Evaluation   Current issues with None Identified None Identified None Identified None Identified    Comments Patient is new to the  program. She has completed 1 session. She continues to have no psychosocial barriers identified. We will continue to monitor her progress in the program. Patient has completed 8 session. She continues to have no psychosocial barriers identified. She enjoys the sessions and demonstrates an interest in improving her health. We will continue to monitor her progress in the program. Patient has completed 20 session. She continues to have no psychosocial barriers identified. She enjoys the sessions and demonstrates an interest in improving her health. We will continue to monitor her progress in the program. Patient has completed 29 session. She continues to have no psychosocial barriers identified. She continues to enjoy the sessions and demonstrates an interest in improving her health. We will continue to monitor her progress in the program.    Expected Outcomes Patient will continue to have no psychosocial barriers identified. Patient will continue to have no psychosocial barriers identified. Patient will continue to have no psychosocial barriers identified. Patient will continue to have no psychosocial barriers identified.    Interventions Relaxation education;Stress management education;Encouraged to attend Cardiac Rehabilitation for the exercise Relaxation education;Stress management education;Encouraged to attend Cardiac Rehabilitation for the exercise Relaxation education;Stress management education;Encouraged to attend Cardiac Rehabilitation for the exercise Relaxation education;Stress management education;Encouraged to attend Cardiac Rehabilitation for the exercise    Continue Psychosocial Services  No Follow up required No Follow up required No Follow up required No Follow up required             Psychosocial Discharge (Final Psychosocial Re-Evaluation):  Psychosocial Re-Evaluation - 02/05/23 9604       Psychosocial Re-Evaluation   Current issues with None Identified    Comments Patient has  completed 29 session. She continues to have no psychosocial barriers identified. She continues to enjoy the sessions and demonstrates an interest in improving her health. We will continue to monitor her progress in the program.    Expected Outcomes Patient will continue to have no psychosocial barriers identified.    Interventions Relaxation education;Stress management education;Encouraged to attend Cardiac Rehabilitation for the exercise    Continue Psychosocial Services  No Follow up required             Vocational Rehabilitation: Provide vocational rehab assistance to qualifying candidates.   Vocational Rehab Evaluation & Intervention:  Vocational Rehab - 11/16/22 1251       Initial Vocational Rehab  Evaluation & Intervention   Assessment shows need for Vocational Rehabilitation No      Vocational Rehab Re-Evaulation   Comments retired             Education: Education Goals: Education classes will be provided on a weekly basis, covering required topics. Participant will state understanding/return demonstration of topics presented.  Learning Barriers/Preferences:  Learning Barriers/Preferences - 11/16/22 1332       Learning Barriers/Preferences   Learning Barriers None    Learning Preferences Group Instruction             Education Topics: Hypertension, Hypertension Reduction -Define heart disease and high blood pressure. Discus how high blood pressure affects the body and ways to reduce high blood pressure. Flowsheet Row CARDIAC REHAB PHASE II EXERCISE from 01/31/2023 in [place] C[facility]AC [facility]  Date 12/06/22  Educator DM  Instruction Review Code 2- Demonstrated Understanding       Exercise and Your Heart -Discuss why it is important to exercise, the FITT principles of exercise, normal and abnormal responses to exercise, and how to exercise safely. Flowsheet Row CARDIAC REHAB PHASE II EXERCISE from 01/31/2023 in [place] [facility]AC [facility]   Date 12/13/22  Educator HB  Instruction Review Code 1- Verbalizes Understanding       Angina -Discuss definition of angina, causes of angina, treatment of angina, and how to decrease risk of having angina. Flowsheet Row CARDIAC REHAB PHASE II EXERCISE from 01/31/2023 in [place] [facility]  Date 12/20/22  Educator HB  Instruction Review Code 1- Verbalizes Understanding       Cardiac Medications -Review what the following cardiac medications are used for, how they affect the body, and side effects that may occur when taking the medications.  Medications include Aspirin, Beta blockers, calcium channel blockers, ACE Inhibitors, angiotensin receptor blockers, diuretics, digoxin, and antihyperlipidemics.   Congestive Heart Failure -Discuss the definition of CHF, how to live with CHF, the signs and symptoms of CHF, and how keep track of weight and sodium intake. Flowsheet Row CARDIAC REHAB PHASE II EXERCISE from 01/31/2023 in [place] [facility]  Date 12/27/22  Educator HB  Instruction Review Code 1- Verbalizes Understanding       Heart Disease and Intimacy -Discus the effect sexual activity has on the heart, how changes occur during intimacy as we age, and safety during sexual activity. Flowsheet Row CARDIAC REHAB PHASE II EXERCISE from 01/31/2023 in [place] [facility]  Date 01/03/23  Educator HB  Instruction Review Code 1- Verbalizes Understanding       Smoking Cessation / COPD -Discuss different methods to quit smoking, the health benefits of quitting smoking, and the definition of COPD. Flowsheet Row CARDIAC REHAB PHASE II EXERCISE from 01/31/2023 in [place] [facility]AC [facility]  Date 01/10/23  Educator HB  Instruction Review Code 2- Demonstrated Understanding       Nutrition I: Fats -Discuss the types of cholesterol, what cholesterol does to the heart, and how cholesterol levels can be controlled. Flowsheet Row CARDIAC  REHAB PHASE II EXERCISE from 01/31/2023 in [place] [facility]  Date 01/17/23  Educator df  Instruction Review Code 1- Verbalizes Understanding       Nutrition II: Labels -Discuss the different components of food labels and how to read food label Flowsheet Row CARDIAC REHAB PHASE II EXERCISE from 01/31/2023 in [place] C[facility]  Date 01/24/23  Educator DJ  Instruction Review Code 1- Verbalizes Understanding       Heart Parts/Heart Disease  and PAD -Discuss the anatomy of the heart, the pathway of blood circulation through the heart, and these are affected by heart disease. Flowsheet Row CARDIAC REHAB PHASE II EXERCISE from 01/31/2023 in [place] [facility]  Date 01/31/23  Educator DJ  Instruction Review Code 1- Verbalizes Understanding       Stress I: Signs and Symptoms -Discuss the causes of stress, how stress may lead to anxiety and depression, and ways to limit stress.   Stress II: Relaxation -Discuss different types of relaxation techniques to limit stress. Flowsheet Row CARDIAC REHAB PHASE II EXERCISE from 01/31/2023 in [place] C[facility]IAC RE[facility]  Date 11/22/22  Educator HB  Instruction Review Code 2- Demonstrated Understanding       Warning Signs of Stroke / TIA -Discuss definition of a stroke, what the signs and symptoms are of a stroke, and how to identify when someone is having stroke. Flowsheet Row CARDIAC REHAB PHASE II EXERCISE from 01/31/2023 in [place] CARDIAC REHABILITATION  Date 11/29/22  Educator HB  Instruction Review Code 1- Verbalizes Understanding       Knowledge Questionnaire Score:  Knowledge Questionnaire Score - 11/16/22 1333       Knowledge Questionnaire Score   Pre Score 21/24             Core Components/Risk Factors/Patient Goals at Admission:  Personal Goals and Risk Factors at Admission - 11/16/22 1336       Core Components/Risk Factors/Patient Goals on Admission    Weight  Management Weight Loss;Yes    Intervention Weight Management: Develop a combined nutrition and exercise program designed to reach desired caloric intake, while maintaining appropriate intake of nutrient and fiber, sodium and fats, and appropriate energy expenditure required for the weight goal.;Weight Management: Provide education and appropriate resources to help participant work on and attain dietary goals.;Weight Management/Obesity: Establish reasonable short term and long term weight goals.;Obesity: Provide education and appropriate resources to help participant work on and attain dietary goals.    Expected Outcomes Understanding of distribution of calorie intake throughout the day with the consumption of 4-5 meals/snacks;Understanding recommendations for meals to include 15-35% energy as protein, 25-35% energy from fat, 35-60% energy from carbohydrates, less than 200mg  of dietary cholesterol, 20-35 gm of total fiber daily;Weight Loss: Understanding of general recommendations for a balanced deficit meal plan, which promotes 1-2 lb weight loss per week and includes a negative energy balance of [phone] kcal/d;Weight Maintenance: Understanding of the daily nutrition guidelines, which includes 25-35% calories from fat, 7% or less cal from saturated fats, less than 200mg  cholesterol, less than 1.5gm of sodium, & 5 or more servings of fruits and vegetables daily;Long Term: Adherence to nutrition and physical activity/exercise program aimed toward attainment of established weight goal;Short Term: Continue to assess and modify interventions until short term weight is achieved    Improve shortness of breath with ADL's Yes    Intervention Provide education, individualized exercise plan and daily activity instruction to help decrease symptoms of SOB with activities of daily living.    Expected Outcomes Short Term: Improve cardiorespiratory fitness to achieve a reduction of symptoms when performing ADLs;Long Term: Be  able to perform more ADLs without symptoms or delay the onset of symptoms    Diabetes Yes    Intervention Provide education about signs/symptoms and action to take for hypo/hyperglycemia.;Provide education about proper nutrition, including hydration, and aerobic/resistive exercise prescription along with prescribed medications to achieve blood glucose in normal ranges: Fasting glucose 65-99 mg/dL    Expected  Outcomes Short Term: Participant verbalizes understanding of the signs/symptoms and immediate care of hyper/hypoglycemia, proper foot care and importance of medication, aerobic/resistive exercise and nutrition plan for blood glucose control.;Long Term: Attainment of HbA1C < 7%.    Heart Failure Yes    Intervention Provide a combined exercise and nutrition program that is supplemented with education, support and counseling about heart failure. Directed toward relieving symptoms such as shortness of breath, decreased exercise tolerance, and extremity edema.    Expected Outcomes Improve functional capacity of life;Short term: Attendance in program 2-3 days a week with increased exercise capacity. Reported lower sodium intake. Reported increased fruit and vegetable intake. Reports medication compliance.;Short term: Daily weights obtained and reported for increase. Utilizing diuretic protocols set by physician.;Long term: Adoption of self-care skills and reduction of barriers for early signs and symptoms recognition and intervention leading to self-care maintenance.    Hypertension Yes    Intervention Provide education on lifestyle modifcations including regular physical activity/exercise, weight management, moderate sodium restriction and increased consumption of fresh fruit, vegetables, and low fat dairy, alcohol moderation, and smoking cessation.;Monitor prescription use compliance.    Expected Outcomes Short Term: Continued assessment and intervention until BP is < 140/4mm HG in hypertensive  participants. < 130/8mm HG in hypertensive participants with diabetes, heart failure or chronic kidney disease.;Long Term: Maintenance of blood pressure at goal levels.    Personal Goal Other Yes    Personal Goal Get to feeling "back to normal", increase strength and endurance.    Intervention Pt will attend [facility] program 3 days a week and begin a home exercise program.    Expected Outcomes Pt will meet both her personal and program goals.             Core Components/Risk Factors/Patient Goals Review:   Goals and Risk Factor Review     Row Name 11/20/22 1256 12/11/22 1419 01/08/23 1228 02/05/23 0728       Core Components/Risk Factors/Patient Goals Review   Personal Goals Review Weight Management/Obesity;Improve shortness of breath with ADL's;Diabetes;Hypertension;Heart Failure;Other Weight Management/Obesity;Improve shortness of breath with ADL's;Diabetes;Hypertension;Heart Failure;Other Weight Management/Obesity;Improve shortness of breath with ADL's;Diabetes;Hypertension;Heart Failure;Other Weight Management/Obesity;Improve shortness of breath with ADL's;Diabetes;Hypertension;Heart Failure;Other    Review Patient was referred to CR with chronic systolic HF. She has multiple risk factors for CAD and is participating in the program for risk modification. She has completed 1 sessions. Her current weight is 218.4 lbs. Her DM is managed with [place]; [place]entad[place] and [place].  She has no A1C on file. Her personal goals for the program are to lose weight; increase her strength and endurance and get back to feeling normal. We will continue to monitor her progress as she works towards meeting these goals. Patient has completed 8 sessions. Her current weight is 220.0 lbs up 1.6 lbs since last 30 day review.  She is doing well in the program with consistent attendance and progressions. Her DM continues to be managed with [place]; [place] and [place]. She has no A1C on file. She saw Dr. Jenene Slicker 4/9 for a  routine check. She increased her Entresto and added Spironolactone 25 mg daily. Her blood pressure is at goal. Her personal goals for the program are to lose weight; increase her strength and endurance and get back to feeling normal. We will continue to monitor her progress as she works towards meeting these goals. Patient has completed 20 sessions. Her current weight is 220.5 lbs up 0.5 lbs since last 30 day review.  She continues to do  well in the program with consistent attendance and progressions. Her DM continues to be managed with Toujeo and Jentadueto. Patient contacted Dr. Jenene Slicker 5/9 about the cost of farxiga and entresto. She said she could not afford them. Dr. Jenene Slicker discontinued both medications and added Lorsartan 50 mg daily. She has no A1C on file.  Her blood pressure continues to be at goal. Her personal goals for the program are to lose weight; increase her strength and endurance and get back to feeling normal. We will continue to monitor her progress as she works towards meeting these goals. Patient has completed 29 sessions. Her current weight is 226.9 lbs up 6.4 lbs since last 30 day review.  She continues to do well in the program with consistent attendance and progressions. Her DM continues to be managed with Toujeo and Jentadueto. She has no A1C on file.  Her blood pressure continues to be at goal. Her personal goals for the program continues to be to lose weight; increase her strength and endurance and get back to feeling normal. We will continue to monitor her progress as she works towards meeting these goals.    Expected Outcomes Patient will complete the program meeting both personal and program goals. Patient will complete the program meeting both personal and program goals. Patient will complete the program meeting both personal and program goals. Patient will complete the program meeting both personal and program goals.             Core Components/Risk Factors/Patient  Goals at Discharge (Final Review):   Goals and Risk Factor Review - 02/05/23 0728       Core Components/Risk Factors/Patient Goals Review   Personal Goals Review Weight Management/Obesity;Improve shortness of breath with ADL's;Diabetes;Hypertension;Heart Failure;Other    Review Patient has completed 29 sessions. Her current weight is 226.9 lbs up 6.4 lbs since last 30 day review.  She continues to do well in the program with consistent attendance and progressions. Her DM continues to be managed with Toujeo and Jentadueto. She has no A1C on file.  Her blood pressure continues to be at goal. Her personal goals for the program continues to be to lose weight; increase her strength and endurance and get back to feeling normal. We will continue to monitor her progress as she works towards meeting these goals.    Expected Outcomes Patient will complete the program meeting both personal and program goals.             ITP Comments:   Comments: ITP REVIEW Pt is making expected progress toward Cardiac Rehab goals after completing 30 sessions. Recommend continued exercise, life style modification, education, and increased stamina and strength.

## 2023-02-14 NOTE — Progress Notes (Signed)
Daily Session Note  Patient Details  Name: Raven Green MRN: [id number] Date of Birth: 07/22/1956 Referring Provider:   Flowsheet Row CARDIAC REHAB PHASE II ORIENTATION from 11/16/2022 in [facility]  Referring Provider Dr. Jenene Slicker       Encounter Date: 02/14/2023  Check In:  Session Check In - 02/14/23 0930       Check-In   Supervising physician immediately available to respond to emergencies [facility] MD immediately available    Physician(s) Dr. Diona Browner    Location [facility]    Staff Present Ross Ludwig, BS, Exercise Physiologist;Brittany Amirault BSN, RN    Virtual Visit No    Medication changes reported     No    Fall or balance concerns reported    No    Tobacco Cessation No Change    Warm-up and Cool-down Performed as group-led instruction    Resistance Training Performed Yes    VAD Patient? No    PAD/SET Patient? No      Pain Assessment   Currently in Pain? No/denies    Pain Score 0-No pain    Multiple Pain Sites No             Capillary Blood Glucose: No results found for this or any previous visit (from the past 24 hour(s)).    Social History   Tobacco Use  Smoking Status Never  Smokeless Tobacco Never    Goals Met:  Independence with exercise equipment Exercise tolerated well No report of concerns or symptoms today Strength training completed today  Goals Unmet:  Not Applicable  Comments:    Dr. Dina Rich is Medical Director for [facility] Cardiac Rehab

## 2023-02-16 ENCOUNTER — Encounter (HOSPITAL_COMMUNITY)
Admission: RE | Admit: 2023-02-16 | Discharge: 2023-02-16 | Disposition: A | Discharge status: Home / Self Care | Payer: Medicare Other | Source: Ambulatory Visit | Attending: Internal Medicine | Admitting: Internal Medicine

## 2023-02-16 DIAGNOSIS — I5022 Chronic systolic (congestive) heart failure: Secondary | ICD-10-CM | POA: Diagnosis not present

## 2023-02-16 NOTE — Progress Notes (Signed)
Daily Session Note  Patient Details  Name: Raven Green MRN: [id number] Date of Birth: [birth year] Referring Provider:   Flowsheet Row CARDIAC REHAB PHASE II ORIENTATION from 11/16/2022 in [facility] CARDIAC REHABILITATION  Referring Provider Dr. Jenene Slicker       Encounter Date: 02/16/2023  Check In:  Session Check In - 02/16/23 0930       Check-In   Supervising physician immediately available to respond to emergencies [facility] MD immediately available    Physician(s) Dr. Diona Browner    Location [facility]    Staff Present Ross Ludwig, BS, Exercise Physiologist;Daphyne Daphine Deutscher, RN, BSN    Virtual Visit No    Medication changes reported     Yes    Comments Pt stopped Jentadueto; started metformin-ER 500mg  2 pill per day; started Glipizide-ER 5mg  1 pill per day    Fall or balance concerns reported    No    Tobacco Cessation No Change    Warm-up and Cool-down Performed as group-led instruction    Resistance Training Performed Yes    VAD Patient? No    PAD/SET Patient? No      Pain Assessment   Currently in Pain? No/denies    Pain Score 0-No pain    Multiple Pain Sites No             Capillary Blood Glucose: No results found for this or any previous visit (from the past 24 hour(s)).    Social History   Tobacco Use  Smoking Status Never  Smokeless Tobacco Never    Goals Met:  Independence with exercise equipment Exercise tolerated well No report of concerns or symptoms today Strength training completed today  Goals Unmet:  Not Applicable  Comments: check out 1030   Dr. Dina Rich is Medical Director for [facility] Cardiac Rehab

## 2023-02-19 ENCOUNTER — Encounter (HOSPITAL_COMMUNITY)
Admission: RE | Admit: 2023-02-19 | Discharge: 2023-02-19 | Disposition: A | Discharge status: Home / Self Care | Payer: Medicare Other | Source: Ambulatory Visit | Attending: Internal Medicine | Admitting: Internal Medicine

## 2023-02-19 DIAGNOSIS — I5022 Chronic systolic (congestive) heart failure: Secondary | ICD-10-CM | POA: Diagnosis not present

## 2023-02-19 NOTE — Progress Notes (Signed)
Daily Session Note  Patient Details  Name: Raven Green MRN: [id number] Date of Birth: [birth year] Referring Provider:   Flowsheet Row CARDIAC REHAB PHASE II ORIENTATION from 11/16/2022 in [facility] CARDIAC REHABILITATION  Referring Provider Dr. Jenene Slicker       Encounter Date: 02/19/2023  Check In:  Session Check In - 02/19/23 0930       Check-In   Supervising physician immediately available to respond to emergencies [facility] MD immediately available    Physician(s) Dr Jenene Slicker    Location [facility]    Staff Present Ross Ludwig, BS, Exercise Physiologist;Chay Mazzoni Daphine Deutscher, RN, BSN    Virtual Visit No    Medication changes reported     No    Tobacco Cessation No Change    Warm-up and Cool-down Performed as group-led instruction    Resistance Training Performed Yes      Pain Assessment   Currently in Pain? No/denies    Pain Score 0-No pain             Capillary Blood Glucose: No results found for this or any previous visit (from the past 24 hour(s)).    Social History   Tobacco Use  Smoking Status Never  Smokeless Tobacco Never    Goals Met:  Independence with exercise equipment Exercise tolerated well No report of concerns or symptoms today Strength training completed today  Goals Unmet:  Not Applicable  Comments: Checkout at 1030.   Dr. Dina Rich is Medical Director for [facility] Cardiac Rehab

## 2023-02-21 ENCOUNTER — Encounter (HOSPITAL_COMMUNITY)
Admission: RE | Admit: 2023-02-21 | Discharge: 2023-02-21 | Disposition: A | Discharge status: Home / Self Care | Payer: Medicare Other | Source: Ambulatory Visit | Attending: Internal Medicine | Admitting: Internal Medicine

## 2023-02-21 DIAGNOSIS — I5022 Chronic systolic (congestive) heart failure: Secondary | ICD-10-CM | POA: Diagnosis not present

## 2023-02-21 NOTE — Progress Notes (Signed)
Daily Session Note  Patient Details  Name: Raven Green MRN: [id number] Date of Birth: 1956-04-07 Referring Provider:   Flowsheet Row CARDIAC REHAB PHASE II ORIENTATION from 11/16/2022 in [facility] CARDIAC REHABILITATION  Referring Provider Dr. Jenene Slicker       Encounter Date: 02/21/2023  Check In:  Session Check In - 02/21/23 0930       Check-In   Supervising physician immediately available to respond to emergencies [facility] MD immediately available    Physician(s) Dr Jenene Slicker    Location [facility]    Staff Present Ross Ludwig, BS, Exercise Physiologist;Hillary Troutman BSN, RN    Virtual Visit No    Medication changes reported     No    Fall or balance concerns reported    No    Tobacco Cessation No Change    Warm-up and Cool-down Performed as group-led instruction    Resistance Training Performed Yes    VAD Patient? No    PAD/SET Patient? No      Pain Assessment   Currently in Pain? No/denies    Pain Score 0-No pain    Multiple Pain Sites No             Capillary Blood Glucose: No results found for this or any previous visit (from the past 24 hour(s)).    Social History   Tobacco Use  Smoking Status Never  Smokeless Tobacco Never    Goals Met:  Independence with exercise equipment Exercise tolerated well No report of concerns or symptoms today Strength training completed today  Goals Unmet:  Not Applicable  Comments: check out 1030   Dr. Dina Rich is Medical Director for [facility] Cardiac Rehab

## 2023-02-23 ENCOUNTER — Encounter (HOSPITAL_COMMUNITY)
Admission: RE | Admit: 2023-02-23 | Discharge: 2023-02-23 | Disposition: A | Discharge status: Home / Self Care | Payer: Medicare Other | Source: Ambulatory Visit | Attending: Internal Medicine | Admitting: Internal Medicine

## 2023-02-23 VITALS — Ht 69.0 in | Wt 224.9 lb

## 2023-02-23 DIAGNOSIS — I5022 Chronic systolic (congestive) heart failure: Secondary | ICD-10-CM

## 2023-02-23 NOTE — Progress Notes (Signed)
Daily Session Note  Patient Details  Name: Raven Green MRN: [id number] Date of Birth: 1956-05-04 Referring Provider:   Flowsheet Row CARDIAC REHAB PHASE II ORIENTATION from 11/16/2022 in [facility] CARDIAC REHABILITATION  Referring Provider Dr. Jenene Slicker       Encounter Date: 02/23/2023  Check In:  Session Check In - 02/23/23 0930       Check-In   Supervising physician immediately available to respond to emergencies [facility] MD immediately available    Physician(s) Dr Jenene Slicker    Location [facility]    Staff Present Ross Ludwig, BS, Exercise Physiologist;Diya Gervasi Laural Benes, RN, Pleas Koch, RN, BSN    Virtual Visit No    Medication changes reported     No    Fall or balance concerns reported    No    Tobacco Cessation No Change    Warm-up and Cool-down Performed as group-led instruction    Resistance Training Performed Yes    VAD Patient? No    PAD/SET Patient? No      Pain Assessment   Currently in Pain? No/denies    Pain Score 0-No pain    Multiple Pain Sites No             Capillary Blood Glucose: No results found for this or any previous visit (from the past 24 hour(s)).    Social History   Tobacco Use  Smoking Status Never  Smokeless Tobacco Never    Goals Met:  Independence with exercise equipment Exercise tolerated well No report of concerns or symptoms today Strength training completed today  Goals Unmet:  Not Applicable  Comments: Check out 1030.   Dr. Dina Rich is Medical Director for [facility] Cardiac Rehab

## 2023-02-27 NOTE — Progress Notes (Signed)
Discharge Progress Report  Patient Details  Name: Raven Green MRN: [id number] Date of Birth: Jan 23, 1956 Referring Provider:   Flowsheet Row CARDIAC REHAB PHASE II ORIENTATION from 11/16/2022 in [facility] CARDIAC REHABILITATION  Referring Provider Dr. Jenene Slicker        Number of Visits: 37  Reason for Discharge:  Patient reached a stable level of exercise. Patient independent in their exercise. Patient has met program and personal goals.  Smoking History:  Social History   Tobacco Use  Smoking Status Never  Smokeless Tobacco Never    Diagnosis:  Heart failure, chronic systolic (HCC)  ADL UCSD:   Initial Exercise Prescription:  Initial Exercise Prescription - 11/16/22 1300       Date of Initial Exercise RX and Referring Provider   Date 11/16/22    Referring Provider Dr. Jenene Slicker    Expected Discharge Date 02/16/23      NuStep   Level 1    SPM 80    Minutes 39      Prescription Details   Frequency (times per week) 3    Duration Progress to 30 minutes of continuous aerobic without signs/symptoms of physical distress      Intensity   THRR 40-80% of Max Heartrate 63-123    Ratings of Perceived Exertion 11-13      Resistance Training   Training Prescription Yes    Weight 0    Reps 10-15             Discharge Exercise Prescription (Final Exercise Prescription Changes):  Exercise Prescription Changes - 02/12/23 1200       Response to Exercise   Blood Pressure (Admit) 110/60    Blood Pressure (Exercise) 122/60    Blood Pressure (Exit) 118/60    Heart Rate (Admit) 81 bpm    Heart Rate (Exercise) 83 bpm    Heart Rate (Exit) 83 bpm    Rating of Perceived Exertion (Exercise) 12    Duration Continue with 30 min of aerobic exercise without signs/symptoms of physical distress.    Intensity THRR unchanged      Progression   Progression Continue to progress workloads to maintain intensity without signs/symptoms of physical distress.       Resistance Training   Training Prescription Yes    Weight 2    Reps 10-15    Time 10 Minutes      NuStep   Level 2    SPM 99    Minutes 39    METs 2.36             Functional Capacity:  6 Minute Walk     Row Name 11/16/22 1316 12/04/22 1241 02/23/23 1249     6 Minute Walk   Phase Initial Initial Discharge   Distance 1100 feet -- 1200 feet   Distance % Change -- -- 9 %   Walk Time 6 minutes -- 6 minutes   # of Rest Breaks 0 -- 0   MPH 2.08 -- 2.27   METS 2.54 -- 2.73   RPE 12 -- 12   VO2 Peak 8.89 -- 9.57   Symptoms No -- No   Resting HR 75 bpm -- 76 bpm   Resting BP 112/60 -- 130/60   Resting Oxygen Saturation  95 % -- 96 %   Exercise Oxygen Saturation  during 6 min walk 96 % -- 96 %   Max Ex. HR 98 bpm -- 104 bpm   Max Ex. BP 140/60 -- 140/62  2 Minute Post BP 120/60 -- 126/62            Psychological, QOL, Others - Outcomes: PHQ 2/9:    02/27/2023    9:44 AM 11/16/2022   12:57 PM  Depression screen PHQ 2/9  Decreased Interest 0 1  Down, Depressed, Hopeless 0 0  PHQ - 2 Score 0 1  Altered sleeping 1 1  Tired, decreased energy 2 3  Change in appetite 3 2  Feeling bad or failure about yourself  0 0  Trouble concentrating 0 0  Moving slowly or fidgety/restless 0 0  Suicidal thoughts 0 0  PHQ-9 Score 6 7  Difficult doing work/chores Not difficult at all Somewhat difficult    Quality of Life:  Quality of Life - 02/23/23 1254       Quality of Life   Select Quality of Life      Quality of Life Scores   Health/Function Pre 14.5 %    Health/Function Post 9.69 %    Health/Function % Change -33.17 %    Socioeconomic Pre 30 %    Socioeconomic Post 25.2 %    Socioeconomic % Change  -16 %    Psych/Spiritual Pre 19.71 %    Psych/Spiritual Post 13.71 %    Psych/Spiritual % Change -30.44 %    Family Pre 21.6 %    Family Post 20.4 %    Family % Change -5.56 %    GLOBAL Pre 19.5 %    GLOBAL Post 15 %    GLOBAL % Change -23.08 %              Personal Goals: Goals established at orientation with interventions provided to work toward goal.  Personal Goals and Risk Factors at Admission - 11/16/22 1336       Core Components/Risk Factors/Patient Goals on Admission    Weight Management Weight Loss;Yes    Intervention Weight Management: Develop a combined nutrition and exercise program designed to reach desired caloric intake, while maintaining appropriate intake of nutrient and fiber, sodium and fats, and appropriate energy expenditure required for the weight goal.;Weight Management: Provide education and appropriate resources to help participant work on and attain dietary goals.;Weight Management/Obesity: Establish reasonable short term and long term weight goals.;Obesity: Provide education and appropriate resources to help participant work on and attain dietary goals.    Expected Outcomes Understanding of distribution of calorie intake throughout the day with the consumption of 4-5 meals/snacks;Understanding recommendations for meals to include 15-35% energy as protein, 25-35% energy from fat, 35-60% energy from carbohydrates, less than 200mg  of dietary cholesterol, 20-35 gm of total fiber daily;Weight Loss: Understanding of general recommendations for a balanced deficit meal plan, which promotes 1-2 lb weight loss per week and includes a negative energy balance of [phone] kcal/d;Weight Maintenance: Understanding of the daily nutrition guidelines, which includes 25-35% calories from fat, 7% or less cal from saturated fats, less than 200mg  cholesterol, less than 1.5gm of sodium, & 5 or more servings of fruits and vegetables daily;Long Term: Adherence to nutrition and physical activity/exercise program aimed toward attainment of established weight goal;Short Term: Continue to assess and modify interventions until short term weight is achieved    Improve shortness of breath with ADL's Yes    Intervention Provide education, individualized  exercise plan and daily activity instruction to help decrease symptoms of SOB with activities of daily living.    Expected Outcomes Short Term: Improve cardiorespiratory fitness to achieve a reduction of symptoms when performing ADLs;Long  Term: Be able to perform more ADLs without symptoms or delay the onset of symptoms    Diabetes Yes    Intervention Provide education about signs/symptoms and action to take for hypo/hyperglycemia.;Provide education about proper nutrition, including hydration, and aerobic/resistive exercise prescription along with prescribed medications to achieve blood glucose in normal ranges: Fasting glucose 65-99 mg/dL    Expected Outcomes Short Term: Participant verbalizes understanding of the signs/symptoms and immediate care of hyper/hypoglycemia, proper foot care and importance of medication, aerobic/resistive exercise and nutrition plan for blood glucose control.;Long Term: Attainment of HbA1C < 7%.    Heart Failure Yes    Intervention Provide a combined exercise and nutrition program that is supplemented with education, support and counseling about heart failure. Directed toward relieving symptoms such as shortness of breath, decreased exercise tolerance, and extremity edema.    Expected Outcomes Improve functional capacity of life;Short term: Attendance in program 2-3 days a week with increased exercise capacity. Reported lower sodium intake. Reported increased fruit and vegetable intake. Reports medication compliance.;Short term: Daily weights obtained and reported for increase. Utilizing diuretic protocols set by physician.;Long term: Adoption of self-care skills and reduction of barriers for early signs and symptoms recognition and intervention leading to self-care maintenance.    Hypertension Yes    Intervention Provide education on lifestyle modifcations including regular physical activity/exercise, weight management, moderate sodium restriction and increased consumption of  fresh fruit, vegetables, and low fat dairy, alcohol moderation, and smoking cessation.;Monitor prescription use compliance.    Expected Outcomes Short Term: Continued assessment and intervention until BP is < 140/19mm HG in hypertensive participants. < 130/53mm HG in hypertensive participants with diabetes, heart failure or chronic kidney disease.;Long Term: Maintenance of blood pressure at goal levels.    Personal Goal Other Yes    Personal Goal Get to feeling "back to normal", increase strength and endurance.    Intervention Pt will attend CR program 3 days a week and begin a home exercise program.    Expected Outcomes Pt will meet both her personal and program goals.              Personal Goals Discharge:  Goals and Risk Factor Review     Row Name 11/20/22 1256 12/11/22 1419 01/08/23 1228 02/05/23 0728 02/27/23 0953     Core Components/Risk Factors/Patient Goals Review   Personal Goals Review Weight Management/Obesity;Improve shortness of breath with ADL's;Diabetes;Hypertension;Heart Failure;Other Weight Management/Obesity;Improve shortness of breath with ADL's;Diabetes;Hypertension;Heart Failure;Other Weight Management/Obesity;Improve shortness of breath with ADL's;Diabetes;Hypertension;Heart Failure;Other Weight Management/Obesity;Improve shortness of breath with ADL's;Diabetes;Hypertension;Heart Failure;Other Weight Management/Obesity;Improve shortness of breath with ADL's;Diabetes;Hypertension;Heart Failure;Other   Review Patient was referred to CR with chronic systolic HF. She has multiple risk factors for CAD and is participating in the program for risk modification. She has completed 1 sessions. Her current weight is 218.4 lbs. Her DM is managed with [place]je[place]; Jentadueto and [place].  She has no A1C on file. Her personal goals for the program are to lose weight; increase her strength and endurance and get back to feeling normal. We will continue to monitor her progress as she works  towards meeting these goals. Patient has completed 8 sessions. Her current weight is 220.0 lbs up 1.6 lbs since last 30 day review.  She is doing well in the program with consistent attendance and progressions. Her DM continues to be managed with [place]; [place] and [place]. She has no A1C on file. She saw Dr. Jenene Slicker 4/9 for a routine check. She increased her Entresto and added Spironolactone  25 mg daily. Her blood pressure is at goal. Her personal goals for the program are to lose weight; increase her strength and endurance and get back to feeling normal. We will continue to monitor her progress as she works towards meeting these goals. Patient has completed 20 sessions. Her current weight is 220.5 lbs up 0.5 lbs since last 30 day review.  She continues to do well in the program with consistent attendance and progressions. Her DM continues to be managed with Toujeo and Jentadueto. Patient contacted Dr. Jenene Slicker 5/9 about the cost of farxiga and entresto. She said she could not afford them. Dr. Jenene Slicker discontinued both medications and added Lorsartan 50 mg daily. She has no A1C on file.  Her blood pressure continues to be at goal. Her personal goals for the program are to lose weight; increase her strength and endurance and get back to feeling normal. We will continue to monitor her progress as she works towards meeting these goals. Patient has completed 29 sessions. Her current weight is 226.9 lbs up 6.4 lbs since last 30 day review.  She continues to do well in the program with consistent attendance and progressions. Her DM continues to be managed with [place]jeo and Jentadueto. She has no A1C on file.  Her blood pressure continues to be at goal. Her personal goals for the program continues to be to lose weight; increase her strength and endurance and get back to feeling normal. We will continue to monitor her progress as she works towards meeting these goals. Paitent graduated for CR with 35 sessions.  She did very well in the program. She says she does feel better and stronger and feels she did meet her persoanl goals. She gained 4.5 lbs overall and says she continues to overeat and plans to work on her diet more. Her walk test improved by 9%. Her blood pressure was at goal throughout the program. She plans to continue exercising on her own.   Expected Outcomes Patient will complete the program meeting both personal and program goals. Patient will complete the program meeting both personal and program goals. Patient will complete the program meeting both personal and program goals. Patient will complete the program meeting both personal and program goals. Patient continue exercise on her own and improve her diet.            Exercise Goals and Review:  Exercise Goals     Row Name 11/16/22 1319 11/20/22 1041 12/18/22 1335 01/16/23 0758 02/12/23 1221     Exercise Goals   Increase Physical Activity Yes Yes Yes Yes Yes   Intervention Provide advice, education, support and counseling about physical activity/exercise needs.;Develop an individualized exercise prescription for aerobic and resistive training based on initial evaluation findings, risk stratification, comorbidities and participant's personal goals. Provide advice, education, support and counseling about physical activity/exercise needs.;Develop an individualized exercise prescription for aerobic and resistive training based on initial evaluation findings, risk stratification, comorbidities and participant's personal goals. Provide advice, education, support and counseling about physical activity/exercise needs.;Develop an individualized exercise prescription for aerobic and resistive training based on initial evaluation findings, risk stratification, comorbidities and participant's personal goals. Provide advice, education, support and counseling about physical activity/exercise needs.;Develop an individualized exercise prescription for  aerobic and resistive training based on initial evaluation findings, risk stratification, comorbidities and participant's personal goals. Provide advice, education, support and counseling about physical activity/exercise needs.;Develop an individualized exercise prescription for aerobic and resistive training based on initial evaluation findings, risk stratification, comorbidities and participant's  personal goals.   Expected Outcomes Short Term: Attend rehab on a regular basis to increase amount of physical activity.;Long Term: Add in home exercise to make exercise part of routine and to increase amount of physical activity.;Long Term: Exercising regularly at least 3-5 days a week. Short Term: Attend rehab on a regular basis to increase amount of physical activity.;Long Term: Add in home exercise to make exercise part of routine and to increase amount of physical activity.;Long Term: Exercising regularly at least 3-5 days a week. Short Term: Attend rehab on a regular basis to increase amount of physical activity.;Long Term: Add in home exercise to make exercise part of routine and to increase amount of physical activity.;Long Term: Exercising regularly at least 3-5 days a week. Short Term: Attend rehab on a regular basis to increase amount of physical activity.;Long Term: Add in home exercise to make exercise part of routine and to increase amount of physical activity.;Long Term: Exercising regularly at least 3-5 days a week. Short Term: Attend rehab on a regular basis to increase amount of physical activity.;Long Term: Add in home exercise to make exercise part of routine and to increase amount of physical activity.;Long Term: Exercising regularly at least 3-5 days a week.   Increase Strength and Stamina Yes Yes Yes Yes Yes   Intervention Provide advice, education, support and counseling about physical activity/exercise needs.;Develop an individualized exercise prescription for aerobic and resistive training  based on initial evaluation findings, risk stratification, comorbidities and participant's personal goals. Provide advice, education, support and counseling about physical activity/exercise needs.;Develop an individualized exercise prescription for aerobic and resistive training based on initial evaluation findings, risk stratification, comorbidities and participant's personal goals. Provide advice, education, support and counseling about physical activity/exercise needs.;Develop an individualized exercise prescription for aerobic and resistive training based on initial evaluation findings, risk stratification, comorbidities and participant's personal goals. Provide advice, education, support and counseling about physical activity/exercise needs.;Develop an individualized exercise prescription for aerobic and resistive training based on initial evaluation findings, risk stratification, comorbidities and participant's personal goals. Provide advice, education, support and counseling about physical activity/exercise needs.;Develop an individualized exercise prescription for aerobic and resistive training based on initial evaluation findings, risk stratification, comorbidities and participant's personal goals.   Expected Outcomes Short Term: Increase workloads from initial exercise prescription for resistance, speed, and METs.;Short Term: Perform resistance training exercises routinely during rehab and add in resistance training at home;Long Term: Improve cardiorespiratory fitness, muscular endurance and strength as measured by increased METs and functional capacity ( ) Short Term: Increase workloads from initial exercise prescription for resistance, speed, and METs.;Short Term: Perform resistance training exercises routinely during rehab and add in resistance training at home;Long Term: Improve cardiorespiratory fitness, muscular endurance and strength as measured by increased METs and functional capacity ( )  Short Term: Increase workloads from initial exercise prescription for resistance, speed, and METs.;Short Term: Perform resistance training exercises routinely during rehab and add in resistance training at home;Long Term: Improve cardiorespiratory fitness, muscular endurance and strength as measured by increased METs and functional capacity ( ) Short Term: Increase workloads from initial exercise prescription for resistance, speed, and METs.;Short Term: Perform resistance training exercises routinely during rehab and add in resistance training at home;Long Term: Improve cardiorespiratory fitness, muscular endurance and strength as measured by increased METs and functional capacity ( ) Short Term: Increase workloads from initial exercise prescription for resistance, speed, and METs.;Short Term: Perform resistance training exercises routinely during rehab and add in resistance training at home;Long Term: Improve cardiorespiratory fitness, muscular endurance  and strength as measured by increased METs and functional capacity ( )   Able to understand and use rate of perceived exertion (RPE) scale Yes Yes Yes Yes Yes   Intervention Provide education and explanation on how to use RPE scale Provide education and explanation on how to use RPE scale Provide education and explanation on how to use RPE scale Provide education and explanation on how to use RPE scale Provide education and explanation on how to use RPE scale   Expected Outcomes Short Term: Able to use RPE daily in rehab to express subjective intensity level;Long Term:  Able to use RPE to guide intensity level when exercising independently Short Term: Able to use RPE daily in rehab to express subjective intensity level;Long Term:  Able to use RPE to guide intensity level when exercising independently Short Term: Able to use RPE daily in rehab to express subjective intensity level;Long Term:  Able to use RPE to guide intensity level when exercising  independently Short Term: Able to use RPE daily in rehab to express subjective intensity level;Long Term:  Able to use RPE to guide intensity level when exercising independently Short Term: Able to use RPE daily in rehab to express subjective intensity level;Long Term:  Able to use RPE to guide intensity level when exercising independently   Knowledge and understanding of Target Heart Rate Range (THRR) Yes Yes Yes Yes Yes   Intervention Provide education and explanation of THRR including how the numbers were predicted and where they are located for reference Provide education and explanation of THRR including how the numbers were predicted and where they are located for reference Provide education and explanation of THRR including how the numbers were predicted and where they are located for reference Provide education and explanation of THRR including how the numbers were predicted and where they are located for reference Provide education and explanation of THRR including how the numbers were predicted and where they are located for reference   Expected Outcomes Short Term: Able to state/look up THRR;Short Term: Able to use daily as guideline for intensity in rehab;Long Term: Able to use THRR to govern intensity when exercising independently Short Term: Able to state/look up THRR;Short Term: Able to use daily as guideline for intensity in rehab;Long Term: Able to use THRR to govern intensity when exercising independently Short Term: Able to state/look up THRR;Short Term: Able to use daily as guideline for intensity in rehab;Long Term: Able to use THRR to govern intensity when exercising independently Short Term: Able to state/look up THRR;Short Term: Able to use daily as guideline for intensity in rehab;Long Term: Able to use THRR to govern intensity when exercising independently Short Term: Able to state/look up THRR;Short Term: Able to use daily as guideline for intensity in rehab;Long Term: Able to use THRR  to govern intensity when exercising independently   Able to check pulse independently Yes Yes Yes Yes Yes   Intervention Provide education and demonstration on how to check pulse in carotid and radial arteries.;Review the importance of being able to check your own pulse for safety during independent exercise Provide education and demonstration on how to check pulse in carotid and radial arteries.;Review the importance of being able to check your own pulse for safety during independent exercise Provide education and demonstration on how to check pulse in carotid and radial arteries.;Review the importance of being able to check your own pulse for safety during independent exercise Provide education and demonstration on how to check pulse in carotid and  radial arteries.;Review the importance of being able to check your own pulse for safety during independent exercise Provide education and demonstration on how to check pulse in carotid and radial arteries.;Review the importance of being able to check your own pulse for safety during independent exercise   Expected Outcomes Short Term: Able to explain why pulse checking is important during independent exercise;Long Term: Able to check pulse independently and accurately Short Term: Able to explain why pulse checking is important during independent exercise;Long Term: Able to check pulse independently and accurately Short Term: Able to explain why pulse checking is important during independent exercise;Long Term: Able to check pulse independently and accurately Short Term: Able to explain why pulse checking is important during independent exercise;Long Term: Able to check pulse independently and accurately Short Term: Able to explain why pulse checking is important during independent exercise;Long Term: Able to check pulse independently and accurately   Understanding of Exercise Prescription Yes Yes Yes Yes Yes   Intervention Provide education, explanation, and  written materials on patient's individual exercise prescription Provide education, explanation, and written materials on patient's individual exercise prescription Provide education, explanation, and written materials on patient's individual exercise prescription Provide education, explanation, and written materials on patient's individual exercise prescription Provide education, explanation, and written materials on patient's individual exercise prescription   Expected Outcomes Short Term: Able to explain program exercise prescription;Long Term: Able to explain home exercise prescription to exercise independently Short Term: Able to explain program exercise prescription;Long Term: Able to explain home exercise prescription to exercise independently Short Term: Able to explain program exercise prescription;Long Term: Able to explain home exercise prescription to exercise independently Short Term: Able to explain program exercise prescription;Long Term: Able to explain home exercise prescription to exercise independently Short Term: Able to explain program exercise prescription;Long Term: Able to explain home exercise prescription to exercise independently            Exercise Goals Re-Evaluation:  Exercise Goals Re-Evaluation     Row Name 11/20/22 1043 12/18/22 1335 01/16/23 0758 02/12/23 1221       Exercise Goal Re-Evaluation   Exercise Goals Review Increase Physical Activity;Increase Strength and Stamina;Able to understand and use rate of perceived exertion (RPE) scale;Knowledge and understanding of Target Heart Rate Range (THRR);Able to check pulse independently;Understanding of Exercise Prescription Increase Physical Activity;Increase Strength and Stamina;Able to understand and use rate of perceived exertion (RPE) scale;Knowledge and understanding of Target Heart Rate Range (THRR);Able to check pulse independently;Understanding of Exercise Prescription Increase Strength and Stamina;Increase  Physical Activity;Able to understand and use rate of perceived exertion (RPE) scale;Knowledge and understanding of Target Heart Rate Range (THRR);Able to check pulse independently;Understanding of Exercise Prescription Increase Physical Activity;Increase Strength and Stamina;Able to understand and use rate of perceived exertion (RPE) scale;Knowledge and understanding of Target Heart Rate Range (THRR);Able to check pulse independently;Understanding of Exercise Prescription    Comments Pt has completed 1 exercise session of CR. She is limited by what she described as muscle pain/stiffness that she attributes to Lasix. She has been very sedentary over the past couple of months. She exercised at 1.96 METs on the stepper during her first session. Will continue to monitor and progress as able. Pt has completed 12 sessions of cardiac rehab. She still continues to be limited due to muscle pain/stiffness that she attribytes to Lasix. She is progressing slow due to being deconditioned. She has increased her SPM on the stepper and currently exercising at  2.23 METs on the stepper. WIll continue to  monitor and progress as able. Pt has completed 23 sessions of cardiac rehab. She continues to be limited due to ongoing mucles pain/stiffness. She continues to progress slowly and has not wanted to increase her level on the stepper after being asked. She is currently exercising at 2.25 METs on the stepper. Will continue to monitor and progress as able. Pt has completed 30 sessions of cardiac rehab, She continues to progress in class but at a slow rate. She is finishing up with rehab next week and is thinking about joining the [employer] She is currently exercising at 2.36 METs on the stepper. Will continue to monitor and progress as able.    Expected Outcomes Through exercise at rehab and at home, the patient will meet their stated goals. Through exercise at rehab and at home, the patient will meet their stated goals.  Through exercise at rehab and at home, the patient will meet their stated goals. Through exercise at rehab and at home, the patient will meet their stated goals.             Nutrition & Weight - Outcomes:  Pre Biometrics - 11/16/22 1406       Pre Biometrics   Height 5\' 4"  (1.626 m)    Weight 100 kg    BMI (Calculated) 37.82             Post Biometrics - 02/23/23 1253        Post  Biometrics   Height 5\' 9"  (1.753 m)    Weight 102 kg    Waist Circumference 44 inches    Hip Circumference 46 inches    Waist to Hip Ratio 0.96 %    BMI (Calculated) 33.19    Triceps Skinfold 38 mm    % Body Fat 45.7 %    Grip Strength 11.4 kg    Flexibility 0 in    Single Leg Stand 0 seconds             Nutrition:  Nutrition Therapy & Goals - 02/27/23 0951       Nutrition Therapy   RD appointment deferred Yes      Personal Nutrition Goals   Comments Patient says she continues to overeat but her diet assessment score did improve from 56 to 50. She says she did learn a lot about healthier eating.             Nutrition Discharge:  Nutrition Assessments - 02/27/23 0951       MEDFICTS Scores   Post Score 50             Education Questionnaire Score:  Knowledge Questionnaire Score - 02/27/23 0949       Knowledge Questionnaire Score   Post Score 20/24            Paitent graduated for CR with 35 sessions. She did very well in the program. She says she does feel better and stronger and feels she did meet her persoanl goals. She gained 4.5 lbs overall and says she continues to overeat and plans to work on her diet more. Her walk test improved by 9%. Her blood pressure was at goal throughout the program. She plans to continue exercising on her own.  Goals reviewed with patient; copy given to patient.

## 2023-03-05 ENCOUNTER — Emergency Department (HOSPITAL_COMMUNITY)
Admission: EM | Admit: 2023-03-05 | Discharge: 2023-03-05 | Disposition: A | Discharge status: Home / Self Care | Payer: Medicare Other | Attending: Emergency Medicine | Admitting: Emergency Medicine

## 2023-03-05 ENCOUNTER — Encounter (HOSPITAL_COMMUNITY): Payer: Self-pay | Admitting: Emergency Medicine

## 2023-03-05 ENCOUNTER — Other Ambulatory Visit: Payer: Self-pay

## 2023-03-05 DIAGNOSIS — W268XXA Contact with other sharp object(s), not elsewhere classified, initial encounter: Secondary | ICD-10-CM | POA: Insufficient documentation

## 2023-03-05 DIAGNOSIS — E119 Type 2 diabetes mellitus without complications: Secondary | ICD-10-CM | POA: Insufficient documentation

## 2023-03-05 DIAGNOSIS — S61411A Laceration without foreign body of right hand, initial encounter: Secondary | ICD-10-CM | POA: Diagnosis not present

## 2023-03-05 DIAGNOSIS — Z7984 Long term (current) use of oral hypoglycemic drugs: Secondary | ICD-10-CM | POA: Insufficient documentation

## 2023-03-05 MED ORDER — BACITRACIN ZINC 500 UNIT/GM EX OINT
TOPICAL_OINTMENT | Freq: Two times a day (BID) | CUTANEOUS | Status: DC
  Administered 2023-03-05: 1 via TOPICAL
  Filled 2023-03-05: qty 0.9

## 2023-03-05 NOTE — ED Triage Notes (Signed)
Pt to ed pov with 3" lac to top of right hand. Pt was unloading the dishwasher when the spinner cut pt. Bleed is controlled, pulses felt, hand warm to touch. NAD at this time

## 2023-03-05 NOTE — Discharge Instructions (Signed)
Keep the wound clean and dry for 48 hours, ER for worsening symptoms including pain swelling pus redness or fever.  Tylenol or ibuprofen for pain.  Keep a topical antibiotic on the wound at all times

## 2023-03-05 NOTE — ED Provider Notes (Signed)
[place] EMERGENCY DEPARTMENT AT [facility] Provider Note   CSN: [id number] Arrival date & time: 03/05/23  1817     History  Chief Complaint  Patient presents with   Extremity Laceration    Right Hand    Raven Green is a 67 y.o. female.  HPI   67 year old female, history of diabetes, presents with a laceration to the right hand, this occurred several hours ago when she was trying to get a dish out of the dishwasher, her hand caught on something sharp and it caused a linear laceration of the dorsum of the right hand.  Had some bleeding, no numbness or weakness  Home Medications Prior to Admission medications   Medication Sig Start Date End Date Taking? Authorizing Provider  dapagliflozin propanediol (FARXIGA) 10 MG TABS tablet Take 1 tablet (10 mg total) by mouth daily before breakfast. 11/01/22   Mallipeddi, Vishnu P, MD  furosemide (LASIX) 40 MG tablet Take 1 tablet (40 mg total) by mouth daily. 09/27/22   Mallipeddi, Vishnu P, MD  linaGLIPtin-metFORMIN HCl ER (JENTADUETO XR) 12-998 MG TB24 Take 1 tablet by mouth daily at 6 PM.    [provider]  losartan (COZAAR) 50 MG tablet Take 1 tablet (50 mg total) by mouth daily. 01/05/23   Mallipeddi, Vishnu P, MD  metoprolol tartrate (LOPRESSOR) 25 MG tablet Take 1 tablet (25 mg total) by mouth 2 (two) times daily. 09/15/22 09/10/23  Mallipeddi, Vishnu P, MD  nitroGLYCERIN (NITROSTAT) 0.4 MG SL tablet Place 1 tablet (0.4 mg total) under the tongue every 5 (five) minutes as needed for chest pain. 09/15/22 12/14/22  Mallipeddi, Vishnu P, MD  potassium chloride SA (KLOR-CON M) 20 MEQ tablet Take 2 tablets (40 mEq total) by mouth 2 (two) times daily for 3 days, THEN 1 tablet (20 mEq total) 2 (two) times daily. Patient taking differently: Take 1 tablet (20 mEq total) 2 (two) times daily. 09/21/22 09/24/23  Mallipeddi, Vishnu P, MD  spironolactone (ALDACTONE) 25 MG tablet Take 1 tablet (25 mg total) by mouth daily. 12/05/22    Mallipeddi, Vishnu P, MD  TOUJEO MAX SOLOSTAR 300 UNIT/ML Solostar Pen Inject 44 Units into the skin daily at 10 pm. 03/11/21   [provider]      Allergies    Codeine and Sulfa antibiotics    Review of Systems   Review of Systems  All other systems reviewed and are negative.   Physical Exam Updated Vital Signs BP 106/71   Pulse 73   Temp 98.9 F (37.2 C) (Oral)   Resp 20   Ht 1.651 m (5\' 5" )   Wt 98.9 kg   SpO2 100%   BMI 36.28 kg/m  Physical Exam Constitutional:      General: She is not in acute distress.    Appearance: She is well-developed. She is not diaphoretic.  HENT:     Head: Normocephalic.  Eyes:     General: No scleral icterus.    Conjunctiva/sclera: Conjunctivae normal.  Cardiovascular:     Rate and Rhythm: Normal rate and regular rhythm.  Pulmonary:     Effort: Pulmonary effort is normal.     Breath sounds: Normal breath sounds.  Musculoskeletal:        General: Tenderness ( ttp over the laceration site) present. Normal range of motion.     Comments: Mild tenderness to the dorsum of the hand over the linear laceration  Skin:    General: Skin is warm and dry.  Comments: Laceration located on right hand, dorsum over thenar eminence The Laceration is linear shaped The depth is superficial, approximately 3 mm deep The length is 3 cm  Neurological:     Mental Status: She is alert.     Coordination: Coordination normal.     Comments: Sensation and motor intact     ED Results / Procedures / Treatments   Labs (all labs ordered are listed, but only abnormal results are displayed) Labs Reviewed - No data to display  EKG None  Radiology No results found.  Procedures .Marland KitchenLaceration Repair  Date/Time: 03/05/2023 10:01 PM  Performed by: Eber Hong, MD Authorized by: Eber Hong, MD   Consent:    Consent obtained:  Verbal   Consent given by:  Patient   Risks, benefits, and alternatives were discussed: yes     Risks discussed:   Infection, pain, need for additional repair, poor cosmetic result, nerve damage and poor wound healing   Alternatives discussed:  No treatment Universal protocol:    Immediately prior to procedure, a time out was called: yes     Patient identity confirmed:  Verbally with patient Anesthesia:    Anesthesia method:  Local infiltration   Local anesthetic:  Lidocaine 1% w/o epi Laceration details:    Location:  Hand   Hand location:  R hand, dorsum   Length (cm):  3   Depth (mm):  3 Pre-procedure details:    Preparation:  Patient was prepped and draped in usual sterile fashion Exploration:    Wound exploration: wound explored through full range of motion and entire depth of wound visualized     Wound extent: fascia not violated, no foreign body, no signs of injury, no nerve damage, no tendon damage, no underlying fracture and no vascular damage     Contaminated: no   Treatment:    Area cleansed with:  Saline and povidone-iodine   Amount of cleaning:  Extensive   Irrigation solution:  Sterile saline   Visualized foreign bodies/material removed: no   Skin repair:    Repair method:  Sutures   Suture size:  5-0   Suture material:  Prolene   Suture technique:  Simple interrupted   Number of sutures:  3 Approximation:    Approximation:  Close Repair type:    Repair type:  Simple Post-procedure details:    Dressing:  Antibiotic ointment and sterile dressing   Procedure completion:  Tolerated     Medications Ordered in ED Medications  bacitracin ointment (has no administration in time range)    ED Course/ Medical Decision Making/ A&P                             Medical Decision Making  Wound repaired, patient stable, no indication for imaging, vital signs normal, given instructions for return, patient agreeable.  Stable for discharge        Final Clinical Impression(s) / ED Diagnoses Final diagnoses:  Laceration of right hand without foreign body, initial encounter     Rx / DC Orders ED Discharge Orders     None         Eber Hong, MD 03/05/23 2202

## 2023-03-05 NOTE — ED Notes (Signed)
Non-adherent dressing placed.

## 2023-03-06 ENCOUNTER — Ambulatory Visit: Payer: Medicare Other | Attending: Internal Medicine

## 2023-03-06 DIAGNOSIS — I428 Other cardiomyopathies: Secondary | ICD-10-CM | POA: Diagnosis not present

## 2023-03-06 LAB — ECHOCARDIOGRAM COMPLETE
AR max vel: 1.92 cm2
AV Area VTI: 1.97 cm2
AV Area mean vel: 2.05 cm2
AV Mean grad: 5 mmHg
AV Peak grad: 8.5 mmHg
Ao pk vel: 1.46 m/s
Area-P 1/2: 2.35 cm2
Est EF: 40
MV VTI: 1.72 cm2
S' Lateral: 4.6 cm
Single Plane A4C EF: 59.7 %

## 2023-03-12 ENCOUNTER — Telehealth: Payer: Self-pay | Admitting: Internal Medicine

## 2023-03-12 DIAGNOSIS — S61411D Laceration without foreign body of right hand, subsequent encounter: Secondary | ICD-10-CM | POA: Diagnosis not present

## 2023-03-12 DIAGNOSIS — W268XXD Contact with other sharp object(s), not elsewhere classified, subsequent encounter: Secondary | ICD-10-CM | POA: Diagnosis not present

## 2023-03-12 NOTE — Telephone Encounter (Signed)
Notified patient Dr.Mallipeddi has not reviewed her echo results yet but as soon as she does we will notify her.

## 2023-03-12 NOTE — Telephone Encounter (Signed)
Patient would like test results of recent echo.

## 2023-03-14 DIAGNOSIS — H524 Presbyopia: Secondary | ICD-10-CM | POA: Diagnosis not present

## 2023-03-22 DIAGNOSIS — I10 Essential (primary) hypertension: Secondary | ICD-10-CM | POA: Diagnosis not present

## 2023-03-22 DIAGNOSIS — E1169 Type 2 diabetes mellitus with other specified complication: Secondary | ICD-10-CM | POA: Diagnosis not present

## 2023-03-28 DIAGNOSIS — I110 Hypertensive heart disease with heart failure: Secondary | ICD-10-CM | POA: Diagnosis not present

## 2023-03-28 DIAGNOSIS — E1169 Type 2 diabetes mellitus with other specified complication: Secondary | ICD-10-CM | POA: Diagnosis not present

## 2023-03-28 DIAGNOSIS — S300XXD Contusion of lower back and pelvis, subsequent encounter: Secondary | ICD-10-CM | POA: Diagnosis not present

## 2023-03-28 DIAGNOSIS — I10 Essential (primary) hypertension: Secondary | ICD-10-CM | POA: Diagnosis not present

## 2023-03-28 DIAGNOSIS — Z5320 Procedure and treatment not carried out because of patient's decision for unspecified reasons: Secondary | ICD-10-CM | POA: Diagnosis not present

## 2023-03-28 DIAGNOSIS — I509 Heart failure, unspecified: Secondary | ICD-10-CM | POA: Diagnosis not present

## 2023-03-28 DIAGNOSIS — E118 Type 2 diabetes mellitus with unspecified complications: Secondary | ICD-10-CM | POA: Diagnosis not present

## 2023-03-29 ENCOUNTER — Encounter: Payer: Self-pay | Admitting: Internal Medicine

## 2023-03-29 ENCOUNTER — Encounter: Payer: Medicare Other | Attending: Internal Medicine | Admitting: Internal Medicine

## 2023-03-29 VITALS — BP 118/68 | HR 67 | Ht 64.0 in | Wt 234.8 lb

## 2023-03-29 DIAGNOSIS — I272 Pulmonary hypertension, unspecified: Secondary | ICD-10-CM | POA: Diagnosis not present

## 2023-03-29 DIAGNOSIS — I428 Other cardiomyopathies: Secondary | ICD-10-CM | POA: Diagnosis not present

## 2023-03-29 DIAGNOSIS — I10 Essential (primary) hypertension: Secondary | ICD-10-CM | POA: Diagnosis not present

## 2023-03-29 MED ORDER — METOPROLOL TARTRATE 25 MG PO TABS: 25.0000 mg | ORAL_TABLET | Freq: Two times a day (BID) | ORAL | Status: AC

## 2023-03-29 MED ORDER — METOPROLOL SUCCINATE ER 25 MG PO TB24: 25.0000 mg | ORAL_TABLET | Freq: Every day | ORAL | 1 refills | Status: DC

## 2023-03-29 NOTE — Progress Notes (Signed)
Cardiology Office Note  Date: 03/29/2023   ID: Tyjae, Zottola May 15, 1956, MRN [id number]  PCP:  Benita Stabile, MD  Cardiologist:  Marjo Bicker, MD Electrophysiologist:  None   Reason for Office Visit: Follow-up of NICM   History of Present Illness: Raven Green is a 67 y.o. female known to have NICM LVEF 30 to 35%, HTN, DM 2, IBS, splenic artery aneurysm s/p embolization in 2014 presented to cardiology clinic for follow-up visit.  Overall doing great.  No symptoms.  She reported having more energy compared to the last time she saw me.  No DOE, orthopnea, PND, leg swelling.  No angina.  Repeat echocardiogram from 7/24 showed improvement in LVEF from 30 to 35% in 2/24 to 40% in 7/24.  Past Medical History:  Diagnosis Date   Arthritis    Diabetes mellitus without complication (HCC)    gestational   Endometriosis    History of migraine headaches    Hypertension    IBS (irritable bowel syndrome)     Past Surgical History:  Procedure Laterality Date   BACK SURGERY     BIOPSY  04/20/2021   Procedure: BIOPSY;  Surgeon: Marguerita Merles, Reuel Boom, MD;  Location: [facility];  Service: Gastroenterology;;  small bowel, gastric   BREAST BIOPSY Left    Fibroadenoma   COLONOSCOPY WITH PROPOFOL N/A 04/20/2021   Procedure: COLONOSCOPY WITH PROPOFOL;  Surgeon: Dolores Frame, MD;  Location: [facility];  Service: Gastroenterology;  Laterality: N/A;   EMBOLIZATION  08/13/2013   Procedure: EMBOLIZATION;  Surgeon: Fransisco Hertz, MD;  Location: [facility] CATH LAB;  Service: Cardiovascular;;  splenic artery aneurysm   ESOPHAGOGASTRODUODENOSCOPY (EGD) WITH PROPOFOL N/A 04/20/2021   Procedure: ESOPHAGOGASTRODUODENOSCOPY (EGD) WITH PROPOFOL;  Surgeon: Dolores Frame, MD;  Location: [facility];  Service: Gastroenterology;  Laterality: N/A;  9:15   POLYPECTOMY  04/20/2021   Procedure: POLYPECTOMY;  Surgeon: Dolores Frame, MD;  Location: [facility];   Service: Gastroenterology;;   RIGHT/LEFT HEART CATH AND CORONARY ANGIOGRAPHY N/A 10/12/2022   Procedure: RIGHT/LEFT HEART CATH AND CORONARY ANGIOGRAPHY;  Surgeon: Lennette Bihari, MD;  Location: [facility] INVASIVE CV LAB;  Service: Cardiovascular;  Laterality: N/A;   TONSILLECTOMY AND ADENOIDECTOMY     VISCERAL ANGIOGRAM N/A 08/13/2013   Procedure: VISCERAL ANGIOGRAM;  Surgeon: Fransisco Hertz, MD;  Location: [facility] CATH LAB;  Service: Cardiovascular;  Laterality: N/A;    Current Outpatient Medications  Medication Sig Dispense Refill   dapagliflozin propanediol (FARXIGA) 10 MG TABS tablet Take 1 tablet (10 mg total) by mouth daily before breakfast. 30 tablet 6   furosemide (LASIX) 40 MG tablet Take 1 tablet (40 mg total) by mouth daily. 30 tablet 11   linaGLIPtin-metFORMIN HCl ER (JENTADUETO XR) 12-998 MG TB24 Take 1 tablet by mouth daily at 6 PM.     losartan (COZAAR) 50 MG tablet Take 1 tablet (50 mg total) by mouth daily. 30 tablet 6   metoprolol tartrate (LOPRESSOR) 25 MG tablet Take 1 tablet (25 mg total) by mouth 2 (two) times daily. 180 tablet 3   nitroGLYCERIN (NITROSTAT) 0.4 MG SL tablet Place 1 tablet (0.4 mg total) under the tongue every 5 (five) minutes as needed for chest pain. 25 tablet 3   potassium chloride SA (KLOR-CON M) 20 MEQ tablet Take 2 tablets (40 mEq total) by mouth 2 (two) times daily for 3 days, THEN 1 tablet (20 mEq total) 2 (two) times daily. (Patient taking differently:  Take 1 tablet (20 mEq total) 2 (two) times daily.) 180 tablet 3   spironolactone (ALDACTONE) 25 MG tablet Take 1 tablet (25 mg total) by mouth daily. 90 tablet 1   TOUJEO MAX SOLOSTAR 300 UNIT/ML Solostar Pen Inject 44 Units into the skin daily at 10 pm.     No current facility-administered medications for this visit.   Allergies:  Codeine and Sulfa antibiotics   Social History: The patient  reports that she has never smoked. She has never used smokeless tobacco. She reports that she does not currently use  alcohol. She reports that she does not use drugs.   Family History: The patient's family history includes Breast cancer in her maternal grandmother; Cancer in her father, maternal grandfather, and maternal grandmother; Deep vein thrombosis in her mother; Diabetes in her father and sister; Gallstones in her maternal grandmother; Hyperlipidemia in her mother; Hypertension in her father and mother; Other in her mother; Peripheral vascular disease in her father; Varicose Veins in her mother.   ROS:  Please see the history of present illness. Otherwise, complete review of systems is positive for none.  All other systems are reviewed and negative.   Physical Exam: VS:  Ht 5\' 4"  (1.626 m)   Wt 234 lb 12.8 oz (106.5 kg)   BMI 40.30 kg/m , BMI Body mass index is 40.3 kg/m.  Wt Readings from Last 3 Encounters:  03/29/23 234 lb 12.8 oz (106.5 kg)  03/05/23 218 lb (98.9 kg)  02/23/23 224 lb 13.9 oz (102 kg)    General: Patient appears comfortable at rest. HEENT: Conjunctiva and lids normal, oropharynx clear with moist mucosa. Neck: Supple, no elevated JVP or carotid bruits, no thyromegaly. Lungs: Clear to auscultation, nonlabored breathing at rest. Cardiac: Regular rate and rhythm, no S3 or significant systolic murmur, no pericardial rub. Abdomen: Soft, nontender, no hepatomegaly, bowel sounds present, no guarding or rebound. Extremities: No pitting edema, distal pulses 2+. Skin: Warm and dry. Musculoskeletal: No kyphosis. Neuropsychiatric: Alert and oriented x3, affect grossly appropriate.  Recent Labwork: 09/13/2022: B Natriuretic Peptide 548.0; Platelets 263 09/26/2022: Magnesium 2.2 10/12/2022: Hemoglobin 12.2 12/11/2022: ALT 15; AST 16; BUN 16; Creatinine, Ser 0.85; Potassium 4.7; Sodium 137  No results found for: "CHOL", "TRIG", "HDL", "CHOLHDL", "VLDL", "LDLCALC", "LDLDIRECT"  Other Studies Reviewed Today: RHC/LHC in 09/2022 10% LAD stenosis Nonobstructive CAD Mild pulmonary  hypertension  Echocardiogram from 09/2022 LVEF 30-35% G2 DD RV systolic function is normal TR inadequate for assessing PA pressure  Assessment and Plan: Patient is a 67 year old F known to have NICM LVEF 30-35%, HTN, DM 2, IBS, splenic artery aneurysm s/p embolization in 2014 presented to cardiology clinic for follow-up visit.  # NICM LVEF 30 to 35% in 2/24 improved to 40% in 7/24 # Chronic heart failure -Continue p.o. Lasix 40 mg once in couple of days (due to severe cramping in bilateral lower extremities if Lasix was taken every day) -Switch metoprolol tartrate to succinate 25 mg once daily -Continue Entresto 49-51 mg twice daily -Continue spironolactone 25 mg once daily -Continue Farxiga 10 mg once daily -Repeat echocardiogram in 7/24 showed LVEF 40% (improved from 30 to 35% in 2/24), with complete echocardiogram in 1 year for evaluation of LVEF.  # Mild pulmonary HTN -Continue above management  # HTN, controlled -Continue above GDMT  I have spent a total of 33 minutes with patient reviewing chart, EKGs, labs and examining patient as well as establishing an assessment and plan that was discussed with the patient.  >  50% of time was spent in direct patient care.    Medication Adjustments/Labs and Tests Ordered: Current medicines are reviewed at length with the patient today.  Concerns regarding medicines are outlined above.   Tests Ordered: No orders of the defined types were placed in this encounter.    Medication Changes: Meds ordered this encounter  Medications   metoprolol succinate (TOPROL XL) 25 MG 24 hr tablet    Sig: Take 1 tablet (25 mg total) by mouth daily.    Dispense:  90 tablet    Refill:  1    03/29/2023-New   metoprolol tartrate (LOPRESSOR) 25 MG tablet    Sig: Take 1 tablet (25 mg total) by mouth 2 (two) times daily for 3 days.        Disposition:  Follow up 6 months  Signed Machai Desmith Verne Spurr, MD, 03/29/2023 8:45 AM    [facility] [address] [place], [place], [place] [address]

## 2023-03-29 NOTE — Patient Instructions (Addendum)
Medication Instructions:  Your physician has recommended you make the following change in your medication:  Finish out Metoprolol tartrate 25 mg twice a day then start taking Metoprolol Succinate 25 mg once a day Continue taking all other medications as prescribed  Labwork: None  Testing/Procedures: None  Follow-Up: Your physician recommends that you schedule a follow-up appointment in: 6 months  Any Other Special Instructions Will Be Listed Below (If Applicable).  If you need a refill on your cardiac medications before your next appointment, please call your pharmacy.

## 2023-04-09 ENCOUNTER — Ambulatory Visit: Payer: Medicare Other | Admitting: Internal Medicine

## 2023-04-24 DIAGNOSIS — Z6841 Body Mass Index (BMI) 40.0 and over, adult: Secondary | ICD-10-CM | POA: Diagnosis not present

## 2023-04-24 DIAGNOSIS — U071 COVID-19: Secondary | ICD-10-CM | POA: Diagnosis not present

## 2023-05-18 ENCOUNTER — Other Ambulatory Visit: Payer: Self-pay | Admitting: Internal Medicine

## 2023-05-22 ENCOUNTER — Other Ambulatory Visit: Payer: Self-pay | Admitting: Internal Medicine

## 2023-07-04 ENCOUNTER — Other Ambulatory Visit: Payer: Self-pay | Admitting: Obstetrics and Gynecology

## 2023-07-04 DIAGNOSIS — Z1231 Encounter for screening mammogram for malignant neoplasm of breast: Secondary | ICD-10-CM

## 2023-07-05 DIAGNOSIS — E1169 Type 2 diabetes mellitus with other specified complication: Secondary | ICD-10-CM | POA: Diagnosis not present

## 2023-07-05 DIAGNOSIS — I10 Essential (primary) hypertension: Secondary | ICD-10-CM | POA: Diagnosis not present

## 2023-07-12 DIAGNOSIS — Z5320 Procedure and treatment not carried out because of patient's decision for unspecified reasons: Secondary | ICD-10-CM | POA: Diagnosis not present

## 2023-07-12 DIAGNOSIS — I10 Essential (primary) hypertension: Secondary | ICD-10-CM | POA: Diagnosis not present

## 2023-07-12 DIAGNOSIS — Z0001 Encounter for general adult medical examination with abnormal findings: Secondary | ICD-10-CM | POA: Diagnosis not present

## 2023-07-12 DIAGNOSIS — E1169 Type 2 diabetes mellitus with other specified complication: Secondary | ICD-10-CM | POA: Diagnosis not present

## 2023-07-13 ENCOUNTER — Telehealth: Payer: Self-pay | Admitting: Internal Medicine

## 2023-07-13 ENCOUNTER — Other Ambulatory Visit: Payer: Self-pay | Admitting: Internal Medicine

## 2023-07-13 MED ORDER — ENTRESTO 49-51 MG PO TABS: 1.0000 | ORAL_TABLET | Freq: Two times a day (BID) | ORAL | 1 refills | Status: DC

## 2023-07-13 NOTE — Telephone Encounter (Signed)
Done

## 2023-07-13 NOTE — Telephone Encounter (Signed)
*  STAT* If patient is at the pharmacy, call can be transferred to refill team.   1. Which medications need to be refilled? (please list name of each medication and dose if known) sacubitril-valsartan (ENTRESTO) 49-51 MG    2. Would you like to learn more about the convenience, safety, & potential cost savings by using the [facility]? No      3. Are you open to using the [facility] (Type [facility]. No  ).   4. Which pharmacy/location (including street and city if local pharmacy) is medication to be sent to? [id number] - [place], [place] - [address]    5. Do they need a 30 day or 90 day supply? 90

## 2023-07-19 ENCOUNTER — Other Ambulatory Visit (HOSPITAL_COMMUNITY): Payer: Self-pay

## 2023-07-19 ENCOUNTER — Telehealth: Payer: Self-pay | Admitting: Pharmacy Technician

## 2023-07-19 NOTE — Telephone Encounter (Signed)
Pharmacy Patient Advocate Encounter   Received notification from CoverMyMeds that prior authorization for entresto is required/requested.   Insurance verification completed.   The patient is insured through [facility] .   Per test claim: PA required; PA submitted to above mentioned insurance via CoverMyMeds Key/confirmation #/EOC [id number] Status is pending

## 2023-07-20 NOTE — Telephone Encounter (Signed)
Pharmacy Patient Advocate Encounter  Received notification from [facility] that Prior Authorization for entresto has been APPROVED from 07/19/23 to 07/18/24   PA #/Case ID/Reference #: [id number]

## 2023-07-24 ENCOUNTER — Ambulatory Visit
Admission: RE | Admit: 2023-07-24 | Discharge: 2023-07-24 | Disposition: A | Discharge status: Home / Self Care | Payer: Medicare Other | Source: Ambulatory Visit | Attending: Obstetrics and Gynecology | Admitting: Obstetrics and Gynecology

## 2023-07-24 DIAGNOSIS — Z1231 Encounter for screening mammogram for malignant neoplasm of breast: Secondary | ICD-10-CM | POA: Diagnosis not present

## 2023-08-26 ENCOUNTER — Other Ambulatory Visit: Payer: Self-pay | Admitting: Internal Medicine

## 2023-09-24 DIAGNOSIS — Z01411 Encounter for gynecological examination (general) (routine) with abnormal findings: Secondary | ICD-10-CM | POA: Diagnosis not present

## 2023-09-24 DIAGNOSIS — N841 Polyp of cervix uteri: Secondary | ICD-10-CM | POA: Diagnosis not present

## 2023-10-01 ENCOUNTER — Other Ambulatory Visit: Payer: Self-pay | Admitting: Internal Medicine

## 2023-10-02 ENCOUNTER — Encounter: Payer: Self-pay | Admitting: Internal Medicine

## 2023-10-02 ENCOUNTER — Ambulatory Visit: Payer: Medicare Other | Attending: Internal Medicine | Admitting: Internal Medicine

## 2023-10-02 VITALS — BP 118/64 | HR 75 | Ht 64.0 in | Wt 243.8 lb

## 2023-10-02 DIAGNOSIS — I428 Other cardiomyopathies: Secondary | ICD-10-CM

## 2023-10-02 NOTE — Patient Instructions (Signed)

## 2023-10-02 NOTE — Progress Notes (Signed)
 Cardiology Office Note  Date: 10/02/2023   ID: Raven, Green 1956-05-15, MRN [id number]  PCP:  Raven Norleen PEDLAR, MD  Cardiologist:  Diannah SHAUNNA Maywood, MD Electrophysiologist:  None   Reason for Office Visit: Follow-up of NICM   History of Present Illness: Raven Green is a 68 y.o. female known to have NICM LVEF 30 to 35% that improved to 40% in 2024, HTN, DM 2, IBS, splenic artery aneurysm s/p embolization in 2014 presented to cardiology clinic for follow-up visit.  Repeat echocardiogram from 7/24 showed improvement in LVEF from 30 to 35% in 2/24 to 40% in 7/24.  Overall doing great, no symptoms.  Has minimal leg swelling.  Past Medical History:  Diagnosis Date   Arthritis    Diabetes mellitus without complication (HCC)    gestational   Endometriosis    History of migraine headaches    Hypertension    IBS (irritable bowel syndrome)     Past Surgical History:  Procedure Laterality Date   BACK SURGERY     BIOPSY  04/20/2021   Procedure: BIOPSY;  Surgeon: Raven Flavors, Toribio, MD;  [facility];  Service: Gastroenterology;;  small bowel, gastric   BREAST BIOPSY Left    Fibroadenoma   COLONOSCOPY WITH PROPOFOL  N/A 04/20/2021   Procedure: COLONOSCOPY WITH PROPOFOL ;  Surgeon: Raven Flavors Toribio, MD;  [facility];  Service: Gastroenterology;  Laterality: N/A;   EMBOLIZATION  08/13/2013   Procedure: EMBOLIZATION;  Surgeon: Redell LITTIE Door, MD;  Location: [facility] C[facility];  Service: Cardiovascular;;  splenic artery aneurysm   ESOPHAGOGASTRODUODENOSCOPY (EGD) WITH PROPOFOL  N/A 04/20/2021   Procedure: ESOPHAGOGASTRODUODENOSCOPY (EGD) WITH PROPOFOL ;  Surgeon: Raven Flavors Toribio, MD;  [facility];  Service: Gastroenterology;  Laterality: N/A;  9:15   POLYPECTOMY  04/20/2021   Procedure: POLYPECTOMY;  Surgeon: Raven Flavors Toribio, MD;  [facility];  Service: Gastroenterology;;   RIGHT/LEFT HEART CATH AND CORONARY  ANGIOGRAPHY N/A 10/12/2022   Procedure: RIGHT/LEFT HEART CATH AND CORONARY ANGIOGRAPHY;  Surgeon: Burnard Debby LABOR, MD;  [facility];  Service: Cardiovascular;  Laterality: N/A;   TONSILLECTOMY AND ADENOIDECTOMY     VISCERAL ANGIOGRAM N/A 08/13/2013   Procedure: VISCERAL ANGIOGRAM;  Surgeon: Redell LITTIE Door, MD;  Location: [facility] CATH LAB;  Service: Cardiovascular;  Laterality: N/A;    Current Outpatient Medications  Medication Sig Dispense Refill   FARXIGA  10 MG TABS tablet TAKE 1 TABLET BY MOUTH DAILY BEFORE BREAKFAST 30 tablet 6   furosemide  (LASIX ) 40 MG tablet TAKE 1 TABLET BY MOUTH DAILY 30 tablet 11   glipiZIDE (GLUCOTROL XL) 5 MG 24 hr tablet Take 1 tablet by mouth daily.     metFORMIN (GLUCOPHAGE-XR) 500 MG 24 hr tablet Take 2 tablets by mouth daily.     metoprolol  succinate (TOPROL  XL) 25 MG 24 hr tablet Take 1 tablet (25 mg total) by mouth daily. 90 tablet 1   metoprolol  tartrate (LOPRESSOR ) 25 MG tablet Take 1 tablet (25 mg total) by mouth 2 (two) times daily for 3 days.     nitroGLYCERIN  (NITROSTAT ) 0.4 MG SL tablet Place 1 tablet (0.4 mg total) under the tongue every 5 (five) minutes as needed for chest pain. 25 tablet 3   potassium chloride  SA (KLOR-CON  M) 20 MEQ tablet TAKE 1 TABLET BY MOUTH TWICE DAILY 180 tablet 0   sacubitril -valsartan  (ENTRESTO ) 49-51 MG Take 1 tablet by mouth 2 (two) times daily. 180 tablet 1  spironolactone  (ALDACTONE ) 25 MG tablet TAKE 1 TABLET BY MOUTH DAILY 90 tablet 1   TOUJEO MAX SOLOSTAR 300 UNIT/ML Solostar Pen Inject 44 Units into the skin daily at 10 pm.     No current facility-administered medications for this visit.   Allergies:  Codeine and Sulfa antibiotics   Social History: The patient  reports that she has never smoked. She has never used smokeless tobacco. She reports that she does not currently use alcohol. She reports that she does not use drugs.   Family History: The patient's family history includes Breast cancer in her  maternal grandmother; Cancer in her father, maternal grandfather, and maternal grandmother; Deep vein thrombosis in her mother; Diabetes in her father and sister; Gallstones in her maternal grandmother; Hyperlipidemia in her mother; Hypertension in her father and mother; Other in her mother; Peripheral vascular disease in her father; Varicose Veins in her mother.   ROS:  Please see the history of present illness. Otherwise, complete review of systems is positive for none.  All other systems are reviewed and negative.   Physical Exam: VS:  Ht 5' 4 (1.626 m)   Wt 243 lb 12.8 oz (110.6 kg)   BMI 41.85 kg/m , BMI Body mass index is 41.85 kg/m.  Wt Readings from Last 3 Encounters:  10/02/23 243 lb 12.8 oz (110.6 kg)  03/29/23 234 lb 12.8 oz (106.5 kg)  03/05/23 218 lb (98.9 kg)    General: Patient appears comfortable at rest. HEENT: Conjunctiva and lids normal, oropharynx clear with moist mucosa. Neck: Supple, no elevated JVP or carotid bruits, no thyromegaly. Lungs: Clear to auscultation, nonlabored breathing at rest. Cardiac: Regular rate and rhythm, no S3 or significant systolic murmur, no pericardial rub. Abdomen: Soft, nontender, no hepatomegaly, bowel sounds present, no guarding or rebound. Extremities: 1+ pitting edema, distal pulses 2+. Skin: Warm and dry. Musculoskeletal: No kyphosis. Neuropsychiatric: Alert and oriented x3, affect grossly appropriate.  Recent Labwork: 10/12/2022: Hemoglobin 12.2 12/11/2022: ALT 15; AST 16; BUN 16; Creatinine, Ser 0.85; Potassium 4.7; Sodium 137  No results found for: CHOL, TRIG, HDL, CHOLHDL, VLDL, LDLCALC, LDLDIRECT  Other Studies Reviewed Today: RHC/LHC in 09/2022 10% LAD stenosis Nonobstructive CAD Mild pulmonary hypertension  Echocardiogram from 09/2022 LVEF 30-35% G2 DD RV systolic function is normal TR inadequate for assessing PA pressure  Assessment and Plan: Patient is a 68 year old F known to have NICM LVEF  30-35%, HTN, DM 2, IBS, splenic artery aneurysm s/p embolization in 2014 presented to cardiology clinic for follow-up visit.  # NICM LVEF 30 to 35% in 2/24 improved to 40% in 7/24 # Chronic heart failure -Continue p.o. Lasix  40 mg once in couple of days (due to severe cramping in bilateral lower extremities if Lasix  was taken every day).  Currently taking p.o. potassium supplements every day, instructed to take it every other day. -Continue metoprolol  succinate 25 mg once daily -Continue Entresto  49-51 mg twice daily -Continue spironolactone  25 mg once daily -Continue Farxiga  10 mg once daily  # Mild pulmonary HTN -Continue above management  # HTN, controlled -Continue above GDMT   Medication Adjustments/Labs and Tests Ordered: Current medicines are reviewed at length with the patient today.  Concerns regarding medicines are outlined above.   Tests Ordered: Orders Placed This Encounter  Procedures   EKG 12-Lead     Medication Changes: No orders of the defined types were placed in this encounter.       Disposition:  Follow up 1 year  Signed Jekhi Bolin Priya Rizwan Kuyper,  MD, 10/02/2023 8:52 AM    [facility] at [facility] [address] [place], [place], [place] [address]

## 2023-10-26 DIAGNOSIS — K080 Exfoliation of teeth due to systemic causes: Secondary | ICD-10-CM | POA: Diagnosis not present

## 2023-11-14 ENCOUNTER — Other Ambulatory Visit: Payer: Self-pay | Admitting: Internal Medicine

## 2023-11-24 ENCOUNTER — Other Ambulatory Visit: Payer: Self-pay | Admitting: Internal Medicine

## 2023-11-26 ENCOUNTER — Other Ambulatory Visit: Payer: Self-pay | Admitting: Internal Medicine

## 2023-11-30 DIAGNOSIS — I10 Essential (primary) hypertension: Secondary | ICD-10-CM | POA: Diagnosis not present

## 2023-12-05 DIAGNOSIS — Z0001 Encounter for general adult medical examination with abnormal findings: Secondary | ICD-10-CM | POA: Diagnosis not present

## 2023-12-05 DIAGNOSIS — E1169 Type 2 diabetes mellitus with other specified complication: Secondary | ICD-10-CM | POA: Diagnosis not present

## 2023-12-05 DIAGNOSIS — I10 Essential (primary) hypertension: Secondary | ICD-10-CM | POA: Diagnosis not present

## 2023-12-05 DIAGNOSIS — Z5320 Procedure and treatment not carried out because of patient's decision for unspecified reasons: Secondary | ICD-10-CM | POA: Diagnosis not present

## 2023-12-05 DIAGNOSIS — E1165 Type 2 diabetes mellitus with hyperglycemia: Secondary | ICD-10-CM | POA: Diagnosis not present

## 2023-12-19 ENCOUNTER — Other Ambulatory Visit: Payer: Self-pay | Admitting: Internal Medicine

## 2024-02-01 DIAGNOSIS — L304 Erythema intertrigo: Secondary | ICD-10-CM | POA: Diagnosis not present

## 2024-03-14 ENCOUNTER — Encounter (INDEPENDENT_AMBULATORY_CARE_PROVIDER_SITE_OTHER): Payer: Self-pay | Admitting: *Deleted

## 2024-03-18 DIAGNOSIS — H524 Presbyopia: Secondary | ICD-10-CM | POA: Diagnosis not present

## 2024-04-06 ENCOUNTER — Other Ambulatory Visit: Payer: Self-pay | Admitting: Internal Medicine

## 2024-04-09 ENCOUNTER — Telehealth: Payer: Self-pay | Admitting: Internal Medicine

## 2024-04-09 MED ORDER — SACUBITRIL-VALSARTAN 49-51 MG PO TABS: 1.0000 | ORAL_TABLET | Freq: Two times a day (BID) | ORAL | 1 refills | Status: DC

## 2024-04-09 NOTE — Addendum Note (Signed)
 Addended by: DARIO IZETTA CROME on: 04/09/2024 09:11 AM   Modules accepted: Orders

## 2024-04-09 NOTE — Telephone Encounter (Signed)
 Rx sent to pharmacy

## 2024-04-09 NOTE — Telephone Encounter (Signed)
*  STAT* If patient is at the pharmacy, call can be transferred to refill team.   1. Which medications need to be refilled? (please list name of each medication and dose if known)   sacubitril -valsartan  (ENTRESTO ) 49-51 MG   2. Would you like to learn more about the convenience, safety, & potential cost savings by using the [facility]?   3. Are you open to using the [facility] (Type [facility]. ).  4. Which pharmacy/location (including street and city if local pharmacy) is medication to be sent to?  [id number] - [place], [place] - [address]   5. Do they need a 30 day or 90 day supply?   90 day  Patient stated only has 2 days left of this medication.

## 2024-05-16 ENCOUNTER — Other Ambulatory Visit: Payer: Self-pay | Admitting: Internal Medicine

## 2024-05-18 ENCOUNTER — Other Ambulatory Visit: Payer: Self-pay | Admitting: Internal Medicine

## 2024-05-30 DIAGNOSIS — I10 Essential (primary) hypertension: Secondary | ICD-10-CM | POA: Diagnosis not present

## 2024-05-30 DIAGNOSIS — E1169 Type 2 diabetes mellitus with other specified complication: Secondary | ICD-10-CM | POA: Diagnosis not present

## 2024-06-03 DIAGNOSIS — I10 Essential (primary) hypertension: Secondary | ICD-10-CM | POA: Diagnosis not present

## 2024-06-03 DIAGNOSIS — I700 Atherosclerosis of aorta: Secondary | ICD-10-CM | POA: Diagnosis not present

## 2024-06-03 DIAGNOSIS — Z5320 Procedure and treatment not carried out because of patient's decision for unspecified reasons: Secondary | ICD-10-CM | POA: Diagnosis not present

## 2024-06-03 DIAGNOSIS — E1169 Type 2 diabetes mellitus with other specified complication: Secondary | ICD-10-CM | POA: Diagnosis not present

## 2024-06-03 DIAGNOSIS — I110 Hypertensive heart disease with heart failure: Secondary | ICD-10-CM | POA: Diagnosis not present

## 2024-07-09 ENCOUNTER — Other Ambulatory Visit: Payer: Self-pay | Admitting: Obstetrics and Gynecology

## 2024-07-09 DIAGNOSIS — Z1231 Encounter for screening mammogram for malignant neoplasm of breast: Secondary | ICD-10-CM

## 2024-07-10 ENCOUNTER — Telehealth: Payer: Self-pay

## 2024-07-10 NOTE — Telephone Encounter (Signed)
 Dr.Zach Shona  Are you diabetic? If yes, Type 1 or Type 2?    Yes type 2  Do you have a prosthetic or mechanical heart valve? no  Do you have a pacemaker/defibrillator?   no  Have you had endocarditis/atrial fibrillation? no  Have you had joint replacement within the last 12 months?  no  Do you tend to be constipated or have to use laxatives? no  Do you have any history of drugs or alchohol?  no  Do you use supplemental oxygen?  no  Have you had a stroke or heart attack within the last 6 months? no  Do you take weight loss medication?  no  For female patients: have you had a hysterectomy?  no                                     are you post menopausal?       yes                                            do you still have your menstrual cycle? no      Do you take any blood-thinning medications such as: (aspirin , warfarin, Plavix, Aggrenox)  no  If yes we need the name, milligram, dosage and who is prescribing doctor  Current Outpatient Medications on File Prior to Visit  Medication Sig Dispense Refill   dapagliflozin  propanediol (FARXIGA ) 10 MG TABS tablet Take by mouth daily.     fluconazole (DIFLUCAN) 150 MG tablet Take 150 mg by mouth every other day.     furosemide  (LASIX ) 40 MG tablet TAKE 1 TABLET BY MOUTH DAILY 30 tablet 11   glipiZIDE (GLUCOTROL XL) 5 MG 24 hr tablet Take 1 tablet by mouth daily.     metFORMIN (GLUCOPHAGE-XR) 500 MG 24 hr tablet Take 2 tablets by mouth daily.     metoprolol  succinate (TOPROL -XL) 25 MG 24 hr tablet TAKE 1 TABLET BY MOUTH DAILY 90 tablet 1   metoprolol  tartrate (LOPRESSOR ) 25 MG tablet Take 1 tablet (25 mg total) by mouth 2 (two) times daily for 3 days.     nitroGLYCERIN  (NITROSTAT ) 0.4 MG SL tablet Place 1 tablet (0.4 mg total) under the tongue every 5 (five) minutes as needed for chest pain. 25 tablet 3   potassium chloride  SA (KLOR-CON  M) 20 MEQ tablet TAKE 1 TABLET BY MOUTH TWICE DAILY 180 tablet 2   sacubitril -valsartan  (ENTRESTO )  49-51 MG Take 1 tablet by mouth 2 (two) times daily. 180 tablet 1   spironolactone  (ALDACTONE ) 25 MG tablet TAKE 1 TABLET BY MOUTH DAILY 90 tablet 1   TOUJEO MAX SOLOSTAR 300 UNIT/ML Solostar Pen Inject 44 Units into the skin daily at 10 pm.     No current facility-administered medications on file prior to visit.    Allergies  Allergen Reactions   Codeine Nausea Only and Other (See Comments)    Felt funny, dizziness   Sulfa Antibiotics Rash     Pharmacy: [facility]  Primary Insurance Name: CHARON [id number], [place] [employer] [id number]   Best number where you can be reached: [phone]

## 2024-07-14 ENCOUNTER — Telehealth (INDEPENDENT_AMBULATORY_CARE_PROVIDER_SITE_OTHER): Payer: Self-pay | Admitting: *Deleted

## 2024-07-14 NOTE — Telephone Encounter (Signed)
 07/14/24  Raven Green 1955/09/19  What type of surgery is being performed? COLONOSCOPY  When is surgery scheduled? TBD  What type of clearance is required (medical or pharmacy to hold medication or both? MEDICAL   Name of physician performing surgery?  Dr. Eartha Rouse [facility] at [facility] Phone: [phone] Fax: [phone]  Anethesia type (none, local, MAC, general)? MAC

## 2024-07-14 NOTE — Telephone Encounter (Signed)
Tried to call the pt to schedule a tele pre op appt, though no answer 

## 2024-07-14 NOTE — Telephone Encounter (Signed)
Clearance sent to cardiology pool

## 2024-07-14 NOTE — Telephone Encounter (Signed)
 Room 3, needs cardiology clearance for procedure, Farxiga  per protocol Thanks

## 2024-07-14 NOTE — Telephone Encounter (Signed)
   Name: Raven Green  DOB: [birth year]  MRN: [id number]  Primary Cardiologist: Diannah SHAUNNA Maywood, MD   Preoperative team, please contact this patient and set up a phone call appointment for further preoperative risk assessment. Please obtain consent and complete medication review. Thank you for your help.  I confirm that guidance regarding antiplatelet and oral anticoagulation therapy has been completed and, if necessary, noted below.  None requested.  I also confirmed the patient resides in the state of [place] . As per [facility] Medical Board telemedicine laws, the patient must reside in the state in which the provider is licensed.   Josefa CHRISTELLA Beauvais, NP 07/14/2024, 2:24 PM [facility]

## 2024-07-15 NOTE — Telephone Encounter (Signed)
 Left message to call back to schedule a tele pre op appt.  ?

## 2024-07-17 DIAGNOSIS — K080 Exfoliation of teeth due to systemic causes: Secondary | ICD-10-CM | POA: Diagnosis not present

## 2024-07-17 NOTE — Telephone Encounter (Signed)
 Spoke to patient and got her scheduled for pre-op clearance on 07/23/24. She thought that we were her Gastroenterology office and getting her scheduled for her procedure. I reassured her that we were her Cardiology office. She will be called from either Katlyn West, NP or Delon Hoover, NP.

## 2024-07-22 NOTE — Progress Notes (Unsigned)
 Virtual Visit via Telephone Note   Because of RILYN SCROGGS co-morbid illnesses, she is at least at moderate risk for complications without adequate follow up.  This format is felt to be most appropriate for this patient at this time.  Due to technical limitations with video connection web designer), today's appointment will be conducted as an audio only telehealth visit, and CHANIKA BYLAND verbally agreed to proceed in this manner.   All issues noted in this document were discussed and addressed.  No physical exam could be performed with this format.  Evaluation Performed:  Preoperative cardiovascular risk assessment _____________   Date:  07/22/2024   Patient ID:  Raven Green, DOB 24-Oct-1955, MRN [id number] Patient Location:  Home Provider location:   Office  Primary Care Provider:  [name], MD Primary Cardiologist:  Diannah SHAUNNA Maywood, MD  Chief Complaint / Patient Profile   68 y.o. y/o female with a h/o NICM, hypertension, DM2, IBS, splenic artery aneurysm s/p embolization 2014 who is pending endoscopy and presents today for telephonic preoperative cardiovascular risk assessment.  History of Present Illness    Raven Green is a 68 y.o. female who presents via audio/video conferencing for a telehealth visit today.  Pt was last seen in cardiology clinic on 10/02/2023 by Dr. Malliipeddi.  At that time [name] [name] was doing well and advised to follow-up in a year.  The patient is now pending procedure as outlined above. Since her last visit, she has been doing well from a cardiac perspective, she has started to exercise by walking 30 minutes every day. She denies chest pain, palpitations, dyspnea, pnd, orthopnea, n, v, dizziness, syncope, edema, weight gain, or early satiety.     Past Medical History    Past Medical History:  Diagnosis Date   Arthritis    Diabetes mellitus without complication (HCC)    gestational   Endometriosis    History of migraine headaches     Hypertension    IBS (irritable bowel syndrome)    Past Surgical History:  Procedure Laterality Date   BACK SURGERY     BIOPSY  04/20/2021   Procedure: BIOPSY;  Surgeon: Eartha Angelia Sieving, MD;  [facility];  Service: Gastroenterology;;  small bowel, gastric   BREAST BIOPSY Left    Fibroadenoma   COLONOSCOPY WITH PROPOFOL  N/A 04/20/2021   Procedure: COLONOSCOPY WITH PROPOFOL ;  Surgeon: Eartha Angelia Sieving, MD;  [facility];  Service: Gastroenterology;  Laterality: N/A;   EMBOLIZATION  08/13/2013   Procedure: EMBOLIZATION;  Surgeon: Redell LITTIE Door, MD;  Location: [facility] CATH LAB;  Service: Cardiovascular;;  splenic artery aneurysm   ESOPHAGOGASTRODUODENOSCOPY (EGD) WITH PROPOFOL  N/A 04/20/2021   Procedure: ESOPHAGOGASTRODUODENOSCOPY (EGD) WITH PROPOFOL ;  Surgeon: Eartha Angelia Sieving, MD;  [facility];  Service: Gastroenterology;  Laterality: N/A;  9:15   POLYPECTOMY  04/20/2021   Procedure: POLYPECTOMY;  Surgeon: Eartha Angelia Sieving, MD;  [facility];  Service: Gastroenterology;;   RIGHT/LEFT HEART CATH AND CORONARY ANGIOGRAPHY N/A 10/12/2022   Procedure: RIGHT/LEFT HEART CATH AND CORONARY ANGIOGRAPHY;  Surgeon: Burnard Debby LABOR, MD;  [facility];  Service: Cardiovascular;  Laterality: N/A;   TONSILLECTOMY AND ADENOIDECTOMY     VISCERAL ANGIOGRAM N/A 08/13/2013   Procedure: VISCERAL ANGIOGRAM;  Surgeon: Redell LITTIE Door, MD;  Location: [facility] CATH LAB;  Service: Cardiovascular;  Laterality: N/A;    Allergies  Allergies  Allergen Reactions   Codeine Nausea Only and Other (  See Comments)    Felt funny, dizziness   Sulfa Antibiotics Rash    Home Medications    Prior to Admission medications   Medication Sig Start Date End Date Taking? Authorizing Provider  dapagliflozin  propanediol (FARXIGA ) 10 MG TABS tablet Take by mouth daily.    [provider]  fluconazole (DIFLUCAN) 150 MG tablet Take 150 mg by  mouth every other day. 01/23/24   [provider]  furosemide  (LASIX ) 40 MG tablet TAKE 1 TABLET BY MOUTH DAILY 10/02/23   Mallipeddi, Vishnu P, MD  glipiZIDE (GLUCOTROL XL) 5 MG 24 hr tablet Take 1 tablet by mouth daily. 01/04/23   [provider]  metFORMIN (GLUCOPHAGE-XR) 500 MG 24 hr tablet Take 2 tablets by mouth daily. 01/04/23   [provider]  metoprolol  succinate (TOPROL -XL) 25 MG 24 hr tablet TAKE 1 TABLET BY MOUTH DAILY 05/19/24   Mallipeddi, Vishnu P, MD  metoprolol  tartrate (LOPRESSOR ) 25 MG tablet Take 1 tablet (25 mg total) by mouth 2 (two) times daily for 3 days. 03/29/23 10/01/24  Mallipeddi, Vishnu P, MD  nitroGLYCERIN  (NITROSTAT ) 0.4 MG SL tablet Place 1 tablet (0.4 mg total) under the tongue every 5 (five) minutes as needed for chest pain. 09/15/22 07/17/24  Mallipeddi, Vishnu P, MD  potassium chloride  SA (KLOR-CON  M) 20 MEQ tablet TAKE 1 TABLET BY MOUTH TWICE DAILY 11/26/23   Mallipeddi, Vishnu P, MD  sacubitril -valsartan  (ENTRESTO ) 49-51 MG Take 1 tablet by mouth 2 (two) times daily. 04/09/24   Mallipeddi, Vishnu P, MD  spironolactone  (ALDACTONE ) 25 MG tablet TAKE 1 TABLET BY MOUTH DAILY 05/16/24   Mallipeddi, Vishnu P, MD  TOUJEO MAX SOLOSTAR 300 UNIT/ML Solostar Pen Inject 44 Units into the skin daily at 10 pm. 03/11/21   [provider]    Physical Exam    Vital Signs:  CHARNELL PEPLINSKI does not have vital signs available for review today.  Given telephonic nature of communication, physical exam is limited. AAOx3. NAD. Normal affect.  Speech and respirations are unlabored.  Accessory Clinical Findings    None  Assessment & Plan    1.  Preoperative Cardiovascular Risk Assessment:     [name]'s perioperative risk of a major cardiac event is 0.9% according to the Revised Cardiac Risk Index (RCRI).  Therefore, she is at low risk for perioperative complications.   Her functional capacity is fair at 6.05 METs according to the [facility] Activity Status  Index (DASI). Recommendations: According to ACC/AHA guidelines, no further cardiovascular testing needed.  The patient may proceed to surgery at acceptable risk.    No request from surgeon to hold any cardiac medications.   The patient was advised that if she develops new symptoms prior to surgery to contact our office to arrange for a follow-up visit, and she verbalized understanding.   A copy of this note will be routed to requesting surgeon.  Time:   Today, I have spent 10 minutes with the patient with telehealth technology discussing medical history, symptoms, and management plan.     Delon JAYSON Hoover, NP  07/22/2024, 1:04 PM

## 2024-07-23 ENCOUNTER — Ambulatory Visit: Attending: Internal Medicine | Admitting: Cardiology

## 2024-07-23 DIAGNOSIS — Z01810 Encounter for preprocedural cardiovascular examination: Secondary | ICD-10-CM

## 2024-07-23 DIAGNOSIS — Z01818 Encounter for other preprocedural examination: Secondary | ICD-10-CM

## 2024-07-23 NOTE — Telephone Encounter (Signed)
Thanks, ok to schedule

## 2024-07-28 NOTE — Telephone Encounter (Signed)
 Questionnaire from recall, no referral needed

## 2024-07-28 NOTE — Telephone Encounter (Signed)
Checked carelon and no PA required

## 2024-07-28 NOTE — Telephone Encounter (Signed)
 Spoke with pt. Scheduled EGD 12/15. Aware medications to hold and when. Discussed egd instructions with her and also mailed. Aware she will get a pre-op phone call with her arrival time.

## 2024-07-29 ENCOUNTER — Ambulatory Visit (INDEPENDENT_AMBULATORY_CARE_PROVIDER_SITE_OTHER)

## 2024-07-29 ENCOUNTER — Ambulatory Visit: Admission: EM | Admit: 2024-07-29 | Discharge: 2024-07-29 | Disposition: A | Discharge status: Home / Self Care

## 2024-07-29 ENCOUNTER — Ambulatory Visit

## 2024-07-29 DIAGNOSIS — M79671 Pain in right foot: Secondary | ICD-10-CM | POA: Diagnosis not present

## 2024-07-29 DIAGNOSIS — S90851A Superficial foreign body, right foot, initial encounter: Secondary | ICD-10-CM | POA: Diagnosis not present

## 2024-07-29 DIAGNOSIS — M19071 Primary osteoarthritis, right ankle and foot: Secondary | ICD-10-CM | POA: Diagnosis not present

## 2024-07-29 NOTE — ED Provider Notes (Signed)
 [facility]    CSN: [id number] Arrival date & time: 07/29/24  0857      History   Chief Complaint No chief complaint on file.   HPI Raven Green is a 68 y.o. female.   The history is provided by the patient.   Patient presents for concerns of a foreign object in her right foot.  Patient states she was walking at home, states that she had on her house shoes, but states that she stepped down and felt a pain on the bottom of her right foot under her small toe.  She states that her husband looked at the area and told her it was a piece of glass.  Patient states that she has pain that runs along the outside of her foot since symptoms starting.  She states that she has pain with ambulation.  Denies fever, chills, chest pain, abdominal pain, nausea, or vomiting..  Past Medical History:  Diagnosis Date   Arthritis    Diabetes mellitus without complication (HCC)    gestational   Endometriosis    History of migraine headaches    Hypertension    IBS (irritable bowel syndrome)     Patient Active Problem List   Diagnosis Date Noted   Nonischemic cardiomyopathy (HCC) 10/10/2022   Cramps of left lower extremity 10/10/2022   Pulmonary HTN (HCC) 10/10/2022   Cardiac chest pain 09/15/2022   HTN, goal below 130/80 09/15/2022   RUQ pain 05/05/2021   Abdominal pain 04/11/2021   Bloating 04/11/2021   IBS (irritable bowel syndrome) 04/11/2021   Arthritis of knee, right 04/30/2014   Splenic artery aneurysm 06/25/2013   Hematuria 06/25/2013   Degenerative disc disease, lumbar 06/25/2013    Past Surgical History:  Procedure Laterality Date   BACK SURGERY     BIOPSY  04/20/2021   Procedure: BIOPSY;  Surgeon: Eartha Angelia Sieving, MD;  Location: [facility];  Service: Gastroenterology;;  small bowel, gastric   BREAST BIOPSY Left    Fibroadenoma   COLONOSCOPY WITH PROPOFOL  N/A 04/20/2021   Procedure: COLONOSCOPY WITH PROPOFOL ;  Surgeon: Eartha Angelia Sieving, MD;   Location: [facility];  Service: Gastroenterology;  Laterality: N/A;   EMBOLIZATION  08/13/2013   Procedure: EMBOLIZATION;  Surgeon: Redell LITTIE Door, MD;  Location: [facility] CATH LAB;  Service: Cardiovascular;;  splenic artery aneurysm   ESOPHAGOGASTRODUODENOSCOPY (EGD) WITH PROPOFOL  N/A 04/20/2021   Procedure: ESOPHAGOGASTRODUODENOSCOPY (EGD) WITH PROPOFOL ;  Surgeon: Eartha Angelia Sieving, MD;  Location: [facility];  Service: Gastroenterology;  Laterality: N/A;  9:15   POLYPECTOMY  04/20/2021   Procedure: POLYPECTOMY;  Surgeon: Eartha Angelia Sieving, MD;  [facility]: [facility];  Service: Gastroenterology;;   RIGHT/LEFT HEART CATH AND CORONARY ANGIOGRAPHY N/A 10/12/2022   Procedure: RIGHT/LEFT HEART CATH AND CORONARY ANGIOGRAPHY;  Surgeon: Burnard Debby LABOR, MD;  Location: [facility];  Service: Cardiovascular;  Laterality: N/A;   TONSILLECTOMY AND ADENOIDECTOMY     VISCERAL ANGIOGRAM N/A 08/13/2013   Procedure: VISCERAL ANGIOGRAM;  Surgeon: Redell LITTIE Door, MD;  Location: [facility] CATH LAB;  Service: Cardiovascular;  Laterality: N/A;    OB History   No obstetric history on file.      Home Medications    Prior to Admission medications   Medication Sig Start Date End Date Taking? Authorizing Provider  clindamycin (CLEOCIN) 300 MG capsule Take 300 mg by mouth 3 (three) times daily. 07/28/24  Yes [provider]  dapagliflozin  propanediol (FARXIGA ) 10 MG TABS tablet Take by mouth  daily.    [provider]  fluconazole (DIFLUCAN) 150 MG tablet Take 150 mg by mouth every other day. 01/23/24   [provider]  furosemide  (LASIX ) 40 MG tablet TAKE 1 TABLET BY MOUTH DAILY 10/02/23   Mallipeddi, Vishnu P, MD  glipiZIDE (GLUCOTROL XL) 5 MG 24 hr tablet Take 1 tablet by mouth daily. 01/04/23   [provider]  metFORMIN (GLUCOPHAGE-XR) 500 MG 24 hr tablet Take 2 tablets by mouth daily. 01/04/23   [provider]  metoprolol  succinate (TOPROL -XL) 25 MG 24 hr  tablet TAKE 1 TABLET BY MOUTH DAILY 05/19/24   Mallipeddi, Vishnu P, MD  metoprolol  tartrate (LOPRESSOR ) 25 MG tablet Take 1 tablet (25 mg total) by mouth 2 (two) times daily for 3 days. 03/29/23 10/01/24  Mallipeddi, Vishnu P, MD  nitroGLYCERIN  (NITROSTAT ) 0.4 MG SL tablet Place 1 tablet (0.4 mg total) under the tongue every 5 (five) minutes as needed for chest pain. 09/15/22 07/17/24  Mallipeddi, Vishnu P, MD  potassium chloride  SA (KLOR-CON  M) 20 MEQ tablet TAKE 1 TABLET BY MOUTH TWICE DAILY 11/26/23   Mallipeddi, Vishnu P, MD  sacubitril -valsartan  (ENTRESTO ) 49-51 MG Take 1 tablet by mouth 2 (two) times daily. 04/09/24   Mallipeddi, Vishnu P, MD  spironolactone  (ALDACTONE ) 25 MG tablet TAKE 1 TABLET BY MOUTH DAILY 05/16/24   Mallipeddi, Vishnu P, MD  TOUJEO MAX SOLOSTAR 300 UNIT/ML Solostar Pen Inject 44 Units into the skin daily at 10 pm. 03/11/21   [provider]    Family History Family History  Problem Relation Age of Onset   Deep vein thrombosis Mother    Hyperlipidemia Mother    Hypertension Mother    Varicose Veins Mother    Other Mother        lupus anticoagulation   Diabetes Father    Hypertension Father    Peripheral vascular disease Father    Cancer Father        bladder   Diabetes Sister    Cancer Maternal Grandmother        breast   Breast cancer Maternal Grandmother    Gallstones Maternal Grandmother        gallstone stuck in liver duct, passed away while in  hospital   Cancer Maternal Grandfather        bladder    Social History Social History   Tobacco Use   Smoking status: Never   Smokeless tobacco: Never  Vaping Use   Vaping status: Never Used  Substance Use Topics   Alcohol use: Not Currently   Drug use: No     Allergies   Codeine and Sulfa antibiotics   Review of Systems Review of Systems Per HPI  Physical Exam Triage Vital Signs ED Triage Vitals  Encounter Vitals Group     BP --      Girls Systolic BP Percentile --      Girls  Diastolic BP Percentile --      Boys Systolic BP Percentile --      Boys Diastolic BP Percentile --      Pulse Rate 07/29/24 0940 87     Resp 07/29/24 0940 16     Temp 07/29/24 0940 97.9 F (36.6 C)     Temp Source 07/29/24 0940 Oral     SpO2 07/29/24 0940 94 %     Weight --      Height --      Head Circumference --      Peak Flow --  Pain Score 07/29/24 0941 5     Pain Loc --      Pain Education --      Exclude from Growth Chart --    No data found.  Updated Vital Signs Pulse 87   Temp 97.9 F (36.6 C) (Oral)   Resp 16   SpO2 94%   Visual Acuity Right Eye Distance:   Left Eye Distance:   Bilateral Distance:    Right Eye Near:   Left Eye Near:    Bilateral Near:     Physical Exam Vitals and nursing note reviewed.  Constitutional:      General: She is not in acute distress.    Appearance: Normal appearance.  HENT:     Head: Normocephalic.  Eyes:     Extraocular Movements: Extraocular movements intact.     Pupils: Pupils are equal, round, and reactive to light.  Pulmonary:     Effort: Pulmonary effort is normal.  Musculoskeletal:     Cervical back: Normal range of motion.       Feet:  Skin:    General: Skin is warm and dry.  Neurological:     General: No focal deficit present.     Mental Status: She is alert and oriented to person, place, and time.  Psychiatric:        Mood and Affect: Mood normal.        Behavior: Behavior normal.      UC Treatments / Results  Labs (all labs ordered are listed, but only abnormal results are displayed) Labs Reviewed - No data to display  EKG   Radiology DG Foot Complete Right Result Date: 07/29/2024 CLINICAL DATA:  Right foot pain after stepping on a foreign object, possibly glass. EXAM: RIGHT FOOT COMPLETE - 3+ VIEW COMPARISON:  None Available. FINDINGS: Distal soft tissue swelling with a small amount of air or gas in an area of more focal swelling lateral to the 5th metatarsal head. No fracture or  radiopaque foreign body. Mild moderate 1st IP joint degenerative changes. Minimal 1st and 2nd MTP joint degenerative changes. Calcaneal enthesophytes. IMPRESSION: 1. Distal soft tissue swelling with a small amount of air or gas in an area of more focal swelling lateral to the 5th metatarsal head. 2. No fracture or radiopaque foreign body. Electronically Signed   By: Elspeth Bathe M.D.   On: 07/29/2024 11:29    Procedures Foreign Body Removal  Date/Time: 07/29/2024 10:56 AM  Performed by: Gilmer Etta PARAS, NP Authorized by: Gilmer Etta PARAS, NP   Consent:    Consent obtained:  Verbal   Consent given by:  Patient   Risks discussed:  Bleeding, pain, incomplete removal and infection Universal protocol:    Procedure explained and questions answered to patient or proxy's satisfaction: yes     Patient identity confirmed:  Verbally with patient Location:    Location:  Foot   Foot location:  R sole   Tendon involvement:  None Pre-procedure details:    Imaging:  X-ray   Neurovascular status: intact   Anesthesia:    Anesthesia method:  Local infiltration   Local anesthetic:  Lidocaine  2% w/o epi Procedure type:    Procedure complexity:  Simple Procedure details:    Foreign bodies recovered:  None Post-procedure details:    Confirmation:  No additional foreign bodies on visualization and no radiologic foreign bodies identified on post-procedure imaging  (including critical care time)  Medications Ordered in UC Medications - No data to display  Initial Impression / Assessment and Plan / UC Course  I have reviewed the triage vital signs and the nursing notes.  Pertinent labs & imaging results that were available during my care of the patient were reviewed by me and considered in my medical decision making (see chart for details).  Patient presents for a possible foreign body in the right foot after she stepped on something in the floor earlier today.  She states that she  does not recall having anything in the floor, states that she normally wears her bedroom shoes.  She states that she does have pain in the bottom of the right foot under the right small toe.  Visual inspection did not reveal an obvious foreign object in the right foot.  Lidocaine  2% without epi was applied locally to allow for further exploration.  No foreign object was obtained.  X-ray of the right foot was negative of any radiopaque objects in the foot.  Patient was advised of same, supportive care recommendations were provided and discussed with the patient to include over-the-counter analgesics, warm Epsom salt soaks of the right foot, and Tylenol  for pain or discomfort.  Patient was advised if the symptoms fail to improve, recommend that she follow-up with podiatry or with her primary care physician for further evaluation.  Patient was in agreement with this plan of care and verbalizes understanding.  All questions were answered.  Patient stable for discharge.  Final Clinical Impressions(s) / UC Diagnoses   Final diagnoses:  Foreign body in right foot, initial encounter     Discharge Instructions      The x-ray was negative for glass or other foreign object in the foot. You may take Tylenol  as needed for pain or discomfort. Keep the right foot clean and dry. Recommend soaking the right foot in warm Epsom salt soaks at least 3-4 times daily while symptoms persist. If your symptoms fail to improve or worsen, recommend follow-up with podiatry or with your primary care physician for further evaluation. Follow-up as needed.     ED Prescriptions   None    PDMP not reviewed this encounter.   Gilmer Etta PARAS, NP 07/29/24 1627

## 2024-07-29 NOTE — ED Triage Notes (Signed)
 Pt reports she has glass in her right foot since this morning that is causing her pain

## 2024-07-29 NOTE — Discharge Instructions (Addendum)
 The x-ray was negative for glass or other foreign object in the foot. You may take Tylenol  as needed for pain or discomfort. Keep the right foot clean and dry. Recommend soaking the right foot in warm Epsom salt soaks at least 3-4 times daily while symptoms persist. If your symptoms fail to improve or worsen, recommend follow-up with podiatry or with your primary care physician for further evaluation. Follow-up as needed.

## 2024-08-01 ENCOUNTER — Telehealth: Payer: Self-pay | Admitting: Internal Medicine

## 2024-08-01 MED ORDER — POTASSIUM CHLORIDE CRYS ER 20 MEQ PO TBCR: 20.0000 meq | EXTENDED_RELEASE_TABLET | Freq: Two times a day (BID) | ORAL | 2 refills | Status: AC

## 2024-08-01 NOTE — Telephone Encounter (Signed)
 Pt c/o medication issue:  1. Name of Medication:   potassium chloride  SA (KLOR-CON  M) 20 MEQ tablet    2. How are you currently taking this medication (dosage and times per day)? As written   3. Are you having a reaction (difficulty breathing--STAT)? No   4. What is your medication issue? Pharmacy called stating they need a new Rx script please advise

## 2024-08-01 NOTE — Telephone Encounter (Signed)
 Spoke with patient to verify what pharmacy Potassium needed to be sent into. Advised her will send into [facility]

## 2024-08-06 ENCOUNTER — Inpatient Hospital Stay (HOSPITAL_COMMUNITY): Admission: RE | Admit: 2024-08-06 | Discharge: 2024-08-06 | Attending: Gastroenterology

## 2024-08-06 ENCOUNTER — Encounter (HOSPITAL_COMMUNITY): Payer: Self-pay

## 2024-08-07 ENCOUNTER — Ambulatory Visit
Admission: RE | Admit: 2024-08-07 | Discharge: 2024-08-07 | Disposition: A | Discharge status: Home / Self Care | Source: Ambulatory Visit | Attending: Obstetrics and Gynecology | Admitting: Obstetrics and Gynecology

## 2024-08-07 DIAGNOSIS — Z1231 Encounter for screening mammogram for malignant neoplasm of breast: Secondary | ICD-10-CM

## 2024-08-11 ENCOUNTER — Ambulatory Visit (HOSPITAL_COMMUNITY)
Admission: RE | Admit: 2024-08-11 | Discharge: 2024-08-11 | Disposition: A | Attending: Gastroenterology | Admitting: Gastroenterology

## 2024-08-11 ENCOUNTER — Ambulatory Visit (HOSPITAL_COMMUNITY): Admitting: Anesthesiology

## 2024-08-11 ENCOUNTER — Encounter (HOSPITAL_COMMUNITY): Payer: Self-pay | Admitting: Gastroenterology

## 2024-08-11 ENCOUNTER — Other Ambulatory Visit: Payer: Self-pay

## 2024-08-11 ENCOUNTER — Encounter (HOSPITAL_COMMUNITY): Admission: RE | Disposition: A | Payer: Self-pay | Attending: Gastroenterology

## 2024-08-11 DIAGNOSIS — M1990 Unspecified osteoarthritis, unspecified site: Secondary | ICD-10-CM | POA: Insufficient documentation

## 2024-08-11 DIAGNOSIS — K2970 Gastritis, unspecified, without bleeding: Secondary | ICD-10-CM | POA: Insufficient documentation

## 2024-08-11 DIAGNOSIS — Z7984 Long term (current) use of oral hypoglycemic drugs: Secondary | ICD-10-CM | POA: Insufficient documentation

## 2024-08-11 DIAGNOSIS — Z79899 Other long term (current) drug therapy: Secondary | ICD-10-CM | POA: Insufficient documentation

## 2024-08-11 DIAGNOSIS — K31A11 Gastric intestinal metaplasia without dysplasia, involving the antrum: Secondary | ICD-10-CM | POA: Insufficient documentation

## 2024-08-11 DIAGNOSIS — Z794 Long term (current) use of insulin: Secondary | ICD-10-CM | POA: Diagnosis not present

## 2024-08-11 DIAGNOSIS — Z833 Family history of diabetes mellitus: Secondary | ICD-10-CM | POA: Insufficient documentation

## 2024-08-11 DIAGNOSIS — K589 Irritable bowel syndrome without diarrhea: Secondary | ICD-10-CM | POA: Insufficient documentation

## 2024-08-11 DIAGNOSIS — I110 Hypertensive heart disease with heart failure: Secondary | ICD-10-CM | POA: Diagnosis not present

## 2024-08-11 DIAGNOSIS — I509 Heart failure, unspecified: Secondary | ICD-10-CM | POA: Insufficient documentation

## 2024-08-11 DIAGNOSIS — E119 Type 2 diabetes mellitus without complications: Secondary | ICD-10-CM | POA: Diagnosis not present

## 2024-08-11 DIAGNOSIS — K31A19 Gastric intestinal metaplasia without dysplasia, unspecified site: Secondary | ICD-10-CM | POA: Diagnosis not present

## 2024-08-11 HISTORY — PX: ESOPHAGOGASTRODUODENOSCOPY: SHX5428

## 2024-08-11 LAB — GLUCOSE, CAPILLARY: Glucose-Capillary: 127 mg/dL — ABNORMAL HIGH (ref 70–99)

## 2024-08-11 SURGERY — EGD (ESOPHAGOGASTRODUODENOSCOPY)
Anesthesia: Monitor Anesthesia Care

## 2024-08-11 MED ORDER — STERILE WATER FOR IRRIGATION IR SOLN
Status: DC | PRN
  Administered 2024-08-11: 09:00:00 100 mL

## 2024-08-11 MED ORDER — LIDOCAINE 2% (20 MG/ML) 5 ML SYRINGE
INTRAMUSCULAR | Status: DC | PRN
  Administered 2024-08-11: 08:00:00 50 mg via INTRAVENOUS

## 2024-08-11 MED ORDER — PROPOFOL 10 MG/ML IV BOLUS
INTRAVENOUS | Status: DC | PRN
  Administered 2024-08-11: 08:00:00 50 mg via INTRAVENOUS
  Administered 2024-08-11: 08:00:00 100 mg via INTRAVENOUS
  Administered 2024-08-11: 08:00:00 50 mg via INTRAVENOUS
  Administered 2024-08-11: 09:00:00 20 mg via INTRAVENOUS

## 2024-08-11 MED ORDER — OMEPRAZOLE MAGNESIUM 20 MG PO TBEC: 20.0000 mg | DELAYED_RELEASE_TABLET | Freq: Every day | ORAL | 3 refills | Status: AC

## 2024-08-11 MED ORDER — LACTATED RINGERS IV SOLN: INTRAVENOUS | Status: DC | PRN

## 2024-08-11 NOTE — Discharge Instructions (Signed)
You are being discharged to home.  Resume your previous diet.  We are waiting for your pathology results.  Take Prilosec (omeprazole) 20 mg by mouth once a day.

## 2024-08-11 NOTE — H&P (Signed)
 Raven Green is an 68 y.o. female.   Chief Complaint:gastric intestinal metaplasia HPI: 68 year old female with past medical history of CHF, diabetes, hypertension and IBS, coming for history of gastric intestinal metaplasia.  Patient endorses occasional issues with abdominal pain.  She otherwise denies having any nausea, vomiting, fever, chills, hematochezia, melena, hematemesis, abdominal distention, diarrhea, jaundice, pruritus or weight loss.   Past Medical History:  Diagnosis Date   Arthritis    CHF (congestive heart failure) (HCC)    Diabetes mellitus without complication (HCC)    gestational   Difficult intubation    patient states she had been told she needs a smaller breathing tube if needed   Endometriosis    History of migraine headaches    Hypertension    IBS (irritable bowel syndrome)     Past Surgical History:  Procedure Laterality Date   BACK SURGERY     BIOPSY  04/20/2021   Procedure: BIOPSY;  Surgeon: Eartha Flavors, Toribio, MD;  Location: [facility];  Service: Gastroenterology;;  small bowel, gastric   BREAST BIOPSY Left    Fibroadenoma   COLONOSCOPY WITH PROPOFOL  N/A 04/20/2021   Procedure: COLONOSCOPY WITH PROPOFOL ;  Surgeon: [name], MD;  Location: [facility];  Service: Gastroenterology;  Laterality: N/A;   EMBOLIZATION  08/13/2013   Procedure: EMBOLIZATION;  Surgeon: Redell LITTIE Door, MD;  Location: [facility] CATH LAB;  Service: Cardiovascular;;  splenic artery aneurysm   ESOPHAGOGASTRODUODENOSCOPY (EGD) WITH PROPOFOL  N/A 04/20/2021   Procedure: ESOPHAGOGASTRODUODENOSCOPY (EGD) WITH PROPOFOL ;  Surgeon: [name], MD;  Location: [facility];  Service: Gastroenterology;  Laterality: N/A;  9:15   POLYPECTOMY  04/20/2021   Procedure: POLYPECTOMY;  Surgeon: [name], MD;  [facility]: [facility];  Service: Gastroenterology;;   RIGHT/LEFT HEART CATH AND CORONARY ANGIOGRAPHY N/A 10/12/2022   Procedure: RIGHT/LEFT  HEART CATH AND CORONARY ANGIOGRAPHY;  Surgeon: Burnard Debby LABOR, MD;  Location: [facility];  Service: Cardiovascular;  Laterality: N/A;   TONSILLECTOMY AND ADENOIDECTOMY     VISCERAL ANGIOGRAM N/A 08/13/2013   Procedure: VISCERAL ANGIOGRAM;  Surgeon: Redell LITTIE Door, MD;  Location: [facility] CATH LAB;  Service: Cardiovascular;  Laterality: N/A;    Family History  Problem Relation Age of Onset   Deep vein thrombosis Mother    Hyperlipidemia Mother    Hypertension Mother    Varicose Veins Mother    Other Mother        lupus anticoagulation   Diabetes Father    Hypertension Father    Peripheral vascular disease Father    Cancer Father        bladder   Diabetes Sister    Cancer Maternal Grandmother        breast   Breast cancer Maternal Grandmother    Gallstones Maternal Grandmother        gallstone stuck in liver duct, passed away while in  hospital   Cancer Maternal Grandfather        bladder   Social History:  reports that she has never smoked. She has never used smokeless tobacco. She reports that she does not currently use alcohol. She reports that she does not use drugs.  Allergies: Allergies[1]  Medications Prior to Admission  Medication Sig Dispense Refill   dapagliflozin  propanediol (FARXIGA ) 10 MG TABS tablet Take by mouth daily.     furosemide  (LASIX ) 40 MG tablet TAKE 1 TABLET BY MOUTH DAILY 30 tablet 11   glipiZIDE (GLUCOTROL XL) 5 MG 24 hr  tablet Take 1 tablet by mouth daily.     metFORMIN (GLUCOPHAGE-XR) 500 MG 24 hr tablet Take 2 tablets by mouth daily.     metoprolol  succinate (TOPROL -XL) 25 MG 24 hr tablet TAKE 1 TABLET BY MOUTH DAILY 90 tablet 1   potassium chloride  SA (KLOR-CON  M) 20 MEQ tablet Take 1 tablet (20 mEq total) by mouth 2 (two) times daily. 180 tablet 2   sacubitril -valsartan  (ENTRESTO ) 49-51 MG Take 1 tablet by mouth 2 (two) times daily. 180 tablet 1   spironolactone  (ALDACTONE ) 25 MG tablet TAKE 1 TABLET BY MOUTH DAILY 90 tablet 1   TOUJEO MAX  SOLOSTAR 300 UNIT/ML Solostar Pen Inject 44 Units into the skin daily at 10 pm.     clindamycin (CLEOCIN) 300 MG capsule Take 300 mg by mouth 3 (three) times daily.     fluconazole (DIFLUCAN) 150 MG tablet Take 150 mg by mouth every other day.     metoprolol  tartrate (LOPRESSOR ) 25 MG tablet Take 1 tablet (25 mg total) by mouth 2 (two) times daily for 3 days.     nitroGLYCERIN  (NITROSTAT ) 0.4 MG SL tablet Place 1 tablet (0.4 mg total) under the tongue every 5 (five) minutes as needed for chest pain. 25 tablet 3    Results for orders placed or performed during the hospital encounter of 08/11/24 (from the past 48 hours)  Glucose, capillary     Status: Abnormal   Collection Time: 08/11/24  6:28 AM  Result Value Ref Range   Glucose-Capillary 127 (H) 70 - 99 mg/dL    Comment: Glucose reference range applies only to samples taken after fasting for at least 8 hours.   No results found.  Review of Systems  All other systems reviewed and are negative.   Blood pressure 130/66, pulse 73, temperature 98.6 F (37 C), temperature source Oral, resp. rate 14, height 5' 4.5 (1.638 m), weight 106.6 kg, SpO2 97%. Physical Exam  GENERAL: The patient is AO x3, in no acute distress. HEENT: Head is normocephalic and atraumatic. EOMI are intact. Mouth is well hydrated and without lesions. NECK: Supple. No masses LUNGS: Clear to auscultation. No presence of rhonchi/wheezing/rales. Adequate chest expansion HEART: RRR, normal s1 and s2. ABDOMEN: Soft, nontender, no guarding, no peritoneal signs, and nondistended. BS +. No masses. EXTREMITIES: Without any cyanosis, clubbing, rash, lesions or edema. NEUROLOGIC: AOx3, no focal motor deficit. SKIN: no jaundice, no rashes   Assessment/Plan  68 year old female with past medical history of CHF, diabetes, hypertension and IBS, coming for history of gastric intestinal metaplasia.  Will proceed with EGD.  Toribio Eartha Flavors, MD 08/11/2024, 7:48 AM        [1]  Allergies Allergen Reactions   Codeine Nausea Only and Other (See Comments)    Felt funny, dizziness   Sulfa Antibiotics Rash

## 2024-08-11 NOTE — Transfer of Care (Signed)
 Immediate Anesthesia Transfer of Care Note  Patient: Raven Green  Procedure(s) Performed: EGD (ESOPHAGOGASTRODUODENOSCOPY)  Patient Location: Short Stay  Anesthesia Type:General  Level of Consciousness: awake  Airway & Oxygen Therapy: Patient Spontanous Breathing  Post-op Assessment: Report given to RN  Post vital signs: Reviewed and stable  Last Vitals:  Vitals Value Taken Time  BP    Temp    Pulse    Resp    SpO2      Last Pain:  Vitals:   08/11/24 0821  TempSrc:   PainSc: 0-No pain         Complications: No notable events documented.

## 2024-08-11 NOTE — Op Note (Signed)
 [facility] Patient Name: Raven Green Procedure Date: 08/11/2024 8:05 AM MRN: [id number] Date of Birth: 1956-05-02 Attending MD: Toribio Fortune , , [id number] CSN: [id number] Age: 68 Admit Type: Outpatient Procedure:                Upper GI endoscopy Indications:              Follow-up of intestinal metaplasia Providers:                Toribio Fortune, Jon LABOR. Gerome RN, RN, Chad                            Wilson, Technician Referring MD:              Medicines:                Monitored Anesthesia Care Complications:            No immediate complications. Estimated Blood Loss:     Estimated blood loss: none. Procedure:                Pre-Anesthesia Assessment:                           - Prior to the procedure, a History and Physical                            was performed, and patient medications, allergies                            and sensitivities were reviewed. The patient's                            tolerance of previous anesthesia was reviewed.                           - The risks and benefits of the procedure and the                            sedation options and risks were discussed with the                            patient. All questions were answered and informed                            consent was obtained.                           - ASA Grade Assessment: III - A patient with severe                            systemic disease.                           After obtaining informed consent, the endoscope was                            passed under direct vision. Throughout the  procedure, the patient's blood pressure, pulse, and                            oxygen saturations were monitored continuously. The                            [id number] ([id number]) Upper was introduced through                            the mouth, and advanced to the second part of                            duodenum. The upper GI endoscopy was  accomplished                            without difficulty. The patient tolerated the                            procedure well. Scope In: 8:26:03 AM Scope Out: 8:33:55 AM Total Procedure Duration: 0 hours 7 minutes 52 seconds  Findings:      The examined esophagus was normal.      Patchy mild inflammation characterized by adherent blood and erythema       was found in the gastric body and in the gastric antrum. Biopsies were       taken with a cold forceps for histology Claria protocol). Imaging was       performed using white light and narrow band imaging to visualize the       mucosa.      The examined duodenum was normal. Impression:               - Normal esophagus.                           - Gastritis. Biopsied.                           - Normal examined duodenum. Moderate Sedation:      Per Anesthesia Care Recommendation:           - Discharge patient to home (ambulatory).                           - Resume previous diet.                           - Await pathology results.                           - Use Prilosec  (omeprazole ) 20 mg PO daily. Procedure Code(s):        --- Professional ---                           [id number], Esophagogastroduodenoscopy, flexible,                            transoral; with biopsy, single or multiple Diagnosis Code(s):        ---  Professional ---                           K29.70, Gastritis, unspecified, without bleeding                           K31.A0, Gastric intestinal metaplasia, unspecified CPT copyright 2022 [employer] Association. All rights reserved. The codes documented in this report are preliminary and upon coder review may  be revised to meet current compliance requirements. Toribio Fortune, MD Toribio Fortune,  08/11/2024 8:42:26 AM This report has been signed electronically. Number of Addenda: 0

## 2024-08-11 NOTE — Anesthesia Preprocedure Evaluation (Signed)
 Anesthesia Evaluation  Patient identified by MRN, date of birth, ID band Patient awake    Reviewed: Allergy & Precautions, H&P , NPO status , Patient's Chart, lab work & pertinent test results, reviewed documented beta blocker date and time   History of Anesthesia Complications (+) DIFFICULT AIRWAY and history of anesthetic complications  Airway Mallampati: II  TM Distance: >3 FB Neck ROM: full    Dental no notable dental hx.    Pulmonary neg pulmonary ROS   Pulmonary exam normal breath sounds clear to auscultation       Cardiovascular Exercise Tolerance: Good hypertension, +CHF  negative cardio ROS  Rhythm:regular Rate:Normal     Neuro/Psych negative neurological ROS  negative psych ROS   GI/Hepatic negative GI ROS, Neg liver ROS,,,  Endo/Other  negative endocrine ROSdiabetes    Renal/GU negative Renal ROS  negative genitourinary   Musculoskeletal  (+) Arthritis ,    Abdominal   Peds  Hematology negative hematology ROS (+)   Anesthesia Other Findings   Reproductive/Obstetrics negative OB ROS                              Anesthesia Physical Anesthesia Plan  ASA: 3  Anesthesia Plan: MAC   Post-op Pain Management:    Induction:   PONV Risk Score and Plan: Propofol  infusion  Airway Management Planned:   Additional Equipment:   Intra-op Plan:   Post-operative Plan:   Informed Consent: I have reviewed the patients History and Physical, chart, labs and discussed the procedure including the risks, benefits and alternatives for the proposed anesthesia with the patient or authorized representative who has indicated his/her understanding and acceptance.     Dental Advisory Given  Plan Discussed with: CRNA  Anesthesia Plan Comments:         Anesthesia Quick Evaluation

## 2024-08-11 NOTE — Anesthesia Postprocedure Evaluation (Signed)
 Anesthesia Post Note  Patient: Raven Green  Procedure(s) Performed: EGD (ESOPHAGOGASTRODUODENOSCOPY)  Patient location during evaluation: Short Stay Anesthesia Type: MAC Level of consciousness: awake and alert Pain management: pain level controlled Vital Signs Assessment: post-procedure vital signs reviewed and stable Cardiovascular status: blood pressure returned to baseline and stable Postop Assessment: no apparent nausea or vomiting Anesthetic complications: no   No notable events documented.   Last Vitals:  Vitals:   08/11/24 0654 08/11/24 0845  BP: 130/66 107/68  Pulse: 73 86  Resp: 14 18  Temp: 37 C 37 C  SpO2: 97% 97%    Last Pain:  Vitals:   08/11/24 0845  TempSrc: Oral  PainSc:                  TORIBIO PAO

## 2024-08-12 ENCOUNTER — Ambulatory Visit (INDEPENDENT_AMBULATORY_CARE_PROVIDER_SITE_OTHER): Payer: Self-pay | Admitting: Gastroenterology

## 2024-08-12 ENCOUNTER — Telehealth (INDEPENDENT_AMBULATORY_CARE_PROVIDER_SITE_OTHER): Payer: Self-pay

## 2024-08-12 ENCOUNTER — Other Ambulatory Visit (INDEPENDENT_AMBULATORY_CARE_PROVIDER_SITE_OTHER): Payer: Self-pay | Admitting: Gastroenterology

## 2024-08-12 ENCOUNTER — Encounter (HOSPITAL_COMMUNITY): Payer: Self-pay | Admitting: Gastroenterology

## 2024-08-12 ENCOUNTER — Telehealth (INDEPENDENT_AMBULATORY_CARE_PROVIDER_SITE_OTHER): Payer: Self-pay | Admitting: Gastroenterology

## 2024-08-12 DIAGNOSIS — R109 Unspecified abdominal pain: Secondary | ICD-10-CM

## 2024-08-12 LAB — SURGICAL PATHOLOGY

## 2024-08-12 MED ORDER — DICYCLOMINE HCL 10 MG PO CAPS: 10.0000 mg | ORAL_CAPSULE | Freq: Two times a day (BID) | ORAL | 1 refills | Status: AC | PRN

## 2024-08-12 NOTE — Telephone Encounter (Signed)
 I spoke with the patient and made her aware per Dr. Eartha, I sent Bentyl  PRN for abdominal pain, she likely ahs pain from gas use during endoscopy.   It is ok for her to take capsule omeprazole .  Patient states understanding of all.

## 2024-08-12 NOTE — Telephone Encounter (Signed)
 Patient called today stating she had an EGD done yesterday, and had severe Spasms and swelling in her abdomen last night. She says the pain was from her umbilical area up under her breast. She says the area was aching and tight. She denies any nausea, vomiting, fever, sight of blood in sputum. She says the cramping has eased this morning, but she is very sore and has some swelling in her abdomen. She uses [facility]. Please advise.    She also states we had sent in omeprazole  tablets for her to the pharmacy, but her insurance would not pay for the tablets so she picked up the capsules, which were time released and wanted to make sure this was ok for her to take instead of the tablets.

## 2024-08-12 NOTE — Telephone Encounter (Signed)
 I sent Bentyl  PRN for abdominal pain, she likely ahs pain from gas use during endoscopy.  It is ok for her to take capsule omeprazole .  Thanks

## 2024-08-12 NOTE — Telephone Encounter (Signed)
 Pt called to say that she spoke to someone earlier and we were going to call something in for pain to her pharmacy and she said they have not received it yet. [phone]

## 2024-08-12 NOTE — Telephone Encounter (Signed)
 I spoke with the patient and made her aware the medication was sent in to [facility] this morning. She says they just called her from [facility] to let her know that they did have the script.   dicyclomine  (BENTYL ) 10 MG capsule 60 capsule 1 08/12/2024 --   Sig - Route: Take 1 capsule (10 mg total) by mouth every 12 (twelve) hours as needed for spasms (abdominal pain). - Oral   Sent to pharmacy as: dicyclomine  (BENTYL ) 10 MG capsule   E-Prescribing Status: Receipt confirmed by pharmacy (08/12/2024 11:07 AM EST)

## 2024-08-15 NOTE — Progress Notes (Signed)
 Patient result letter mailed procedure note and pathology result faxed to PCP

## 2024-08-19 ENCOUNTER — Telehealth (INDEPENDENT_AMBULATORY_CARE_PROVIDER_SITE_OTHER): Payer: Self-pay | Admitting: Gastroenterology

## 2024-08-19 NOTE — Telephone Encounter (Signed)
 What does this mean? The pathology showed only the lowest portion of your stomach had intestinal metaplasia. No precancerous changes were noted.Intestinal metaplasia is a change in the lining of the stomach which is seen in patients that have a slightly high risk of developing gastric cancer in the future.  This risk is higher if there is family history of gastric cancer or if coming from areas with increased risk of cancer, such [place] or [place].  You do not meet any of these criteria.  As the intestinal metaplasia is not located throughout the stomach, your risk is very low. Due to this, I will not recommend a repeat upper endoscopy for surveillance at this point.  Please continue taking your prescribed medications.

## 2024-08-19 NOTE — Telephone Encounter (Signed)
 I spoke with the patient and made her aware this would also be mailed to her,but, Per Dr. Eartha, The pathology showed only the lowest portion of your stomach had intestinal metaplasia. No precancerous changes were noted.Intestinal metaplasia is a change in the lining of the stomach which is seen in patients that have a slightly high risk of developing gastric cancer in the future. This risk is higher if there is family history of gastric cancer or if coming from areas with increased risk of cancer, such [place] or [place]. You do not meet any of these criteria. As the intestinal metaplasia is not located throughout the stomach, your risk is very low. Due to this, I will not recommend a repeat upper endoscopy for surveillance at this point. Please continue taking your prescribed medications. Patient states understanding of all.

## 2024-08-19 NOTE — Telephone Encounter (Signed)
 I called and left a Voice message asked that the patient please return call to the office.

## 2024-08-19 NOTE — Telephone Encounter (Signed)
 Pt was calling to see if her results from her procedure were available. [phone]

## 2024-09-01 ENCOUNTER — Telehealth: Payer: Self-pay | Admitting: Pharmacy Technician

## 2024-09-01 ENCOUNTER — Telehealth: Payer: Self-pay | Admitting: Internal Medicine

## 2024-09-01 ENCOUNTER — Other Ambulatory Visit (HOSPITAL_COMMUNITY): Payer: Self-pay

## 2024-09-01 MED ORDER — SACUBITRIL-VALSARTAN 49-51 MG PO TABS: 1.0000 | ORAL_TABLET | Freq: Two times a day (BID) | ORAL | 3 refills | Status: DC

## 2024-09-01 NOTE — Addendum Note (Signed)
 Addended by: Jazell Rosenau, GAYLE G on: 09/01/2024 04:48 PM   Modules accepted: Orders

## 2024-09-01 NOTE — Telephone Encounter (Signed)
 Pt c/o medication issue:  1. Name of Medication: sacubitril -valsartan  (ENTRESTO ) 49-51 MG   2. How are you currently taking this medication (dosage and times per day)? No  3. Are you having a reaction (difficulty breathing--STAT)? No  4. What is your medication issue? Pt is calling stating insurance company stated she will have to pay a $200 out of pocket fee for this medication. Patient is asking for a prescription change to something that is covered under insurance or more affordable.

## 2024-09-01 NOTE — Telephone Encounter (Signed)
"  Rx sent as requested.  "

## 2024-09-01 NOTE — Telephone Encounter (Signed)
 Hi, I got the patient a healthwell grant but [facility] said they needed a new prescription sent to their pharmacy please and thank you!  APW:[id number] ERW:[id number] Hmnle:[id number] PI:[id number]

## 2024-09-01 NOTE — Telephone Encounter (Signed)
" ° °  Patient Advocate Encounter   The patient was approved for a [employer] grant that will help cover the cost of ENTRESTO  Total amount awarded, 7500.  Effective: 08/02/24 - 08/01/25   APW:[id number] ERW:[id number] Hmnle:[id number] PI:[id number] [employer] ID: [id number]  I called [facility] and they said they need a new prescription  I called the patient to let them know but had to leave a message  "

## 2024-09-14 ENCOUNTER — Other Ambulatory Visit: Payer: Self-pay | Admitting: Internal Medicine

## 2024-09-17 ENCOUNTER — Telehealth: Payer: Self-pay | Admitting: Internal Medicine

## 2024-09-17 NOTE — Telephone Encounter (Signed)
" °*  STAT* If patient is at the pharmacy, call can be transferred to refill team.   1. Which medications need to be refilled? (please list name of each medication and dose if known)   FARXIGA  10 MG TABS tablet   2. Which pharmacy/location (including street and city if local pharmacy) is medication to be sent to?  [facility] - [place], [place] - [address]   3. Do they need a 30 day or 90 day supply? 90 days   Pt states that the medication was sent to the wrong pharmacy.and like it to sent to this location instead. Please advise   "

## 2024-09-22 NOTE — Telephone Encounter (Signed)
 Refill was sent 09/16/24.

## 2024-09-24 ENCOUNTER — Telehealth: Payer: Self-pay | Admitting: Internal Medicine

## 2024-09-24 MED ORDER — DAPAGLIFLOZIN PROPANEDIOL 10 MG PO TABS: 10.0000 mg | ORAL_TABLET | Freq: Every day | ORAL | 2 refills | Status: AC

## 2024-09-24 NOTE — Telephone Encounter (Signed)
" °*  STAT* If patient is at the pharmacy, call can be transferred to refill team.   1. Which medications need to be refilled? (please list name of each medication and dose if known)   sacubitril -valsartan  (ENTRESTO ) 49-51 MG     2. Would you like to learn more about the convenience, safety, & potential cost savings by using the [facility]? No     3. Are you open to using the [facility] (Type [facility]. No    4. Which pharmacy/location (including street and city if local pharmacy) is medication to be sent to? [facility] - [place], [place] - [address]       5. Do they need a 30 day or 90 day supply? 90 day   "

## 2024-09-24 NOTE — Addendum Note (Signed)
 Addended by: MEMORY DELON POUR on: 09/24/2024 03:37 PM   Modules accepted: Orders

## 2024-09-24 NOTE — Telephone Encounter (Signed)
 Patient calling to f/u on this. Rx needs to be sent to  [facility] - [place], [place] - [address]  Please advise.

## 2024-09-24 NOTE — Telephone Encounter (Signed)
Resent to [facility]

## 2024-09-25 MED ORDER — SACUBITRIL-VALSARTAN 49-51 MG PO TABS: 1.0000 | ORAL_TABLET | Freq: Two times a day (BID) | ORAL | 3 refills | Status: AC

## 2024-09-25 NOTE — Telephone Encounter (Signed)
 Entresto  refilled as requested.

## 2024-10-17 ENCOUNTER — Ambulatory Visit: Admitting: Internal Medicine

## 2024-11-10 ENCOUNTER — Ambulatory Visit (INDEPENDENT_AMBULATORY_CARE_PROVIDER_SITE_OTHER): Admitting: Gastroenterology

## 2024-11-26 ENCOUNTER — Ambulatory Visit: Admitting: Internal Medicine
